# Patient Record
Sex: Male | Born: 1965
Health system: Southern US, Community
[De-identification: ages and names within clinical notes are randomized; demographics above are authoritative.]

## PROBLEM LIST (undated history)

## (undated) DIAGNOSIS — N050 Unspecified nephritic syndrome with minor glomerular abnormality: Secondary | ICD-10-CM

## (undated) DIAGNOSIS — E7800 Pure hypercholesterolemia, unspecified: Secondary | ICD-10-CM

## (undated) DIAGNOSIS — M109 Gout, unspecified: Secondary | ICD-10-CM

## (undated) DIAGNOSIS — I2510 Atherosclerotic heart disease of native coronary artery without angina pectoris: Secondary | ICD-10-CM

## (undated) DIAGNOSIS — I10 Essential (primary) hypertension: Secondary | ICD-10-CM

## (undated) HISTORY — PX: CHOLECYSTECTOMY: SHX55

## (undated) HISTORY — PX: CARDIAC CATHETERIZATION: SHX172

## (undated) HISTORY — PX: THORACENTESIS: SHX235

---

## 1984-11-12 DIAGNOSIS — S27302A Unspecified injury of lung, bilateral, initial encounter: Secondary | ICD-10-CM

## 1984-11-12 HISTORY — DX: Unspecified injury of lung, bilateral, initial encounter: S27.302A

## 2006-09-27 ENCOUNTER — Encounter: Admission: RE | Admit: 2006-09-27 | Discharge: 2006-09-27 | Payer: Self-pay | Admitting: Occupational Medicine

## 2009-12-03 ENCOUNTER — Other Ambulatory Visit: Payer: Self-pay | Admitting: Emergency Medicine

## 2009-12-03 ENCOUNTER — Ambulatory Visit: Payer: Self-pay | Admitting: Infectious Diseases

## 2009-12-03 ENCOUNTER — Inpatient Hospital Stay (HOSPITAL_COMMUNITY): Admission: EM | Admit: 2009-12-03 | Discharge: 2009-12-07 | Payer: Self-pay | Admitting: Emergency Medicine

## 2009-12-06 ENCOUNTER — Encounter: Payer: Self-pay | Admitting: Infectious Diseases

## 2011-01-29 LAB — COMPREHENSIVE METABOLIC PANEL
ALT: 152 U/L — ABNORMAL HIGH (ref 0–53)
ALT: 322 U/L — ABNORMAL HIGH (ref 0–53)
AST: 65 U/L — ABNORMAL HIGH (ref 0–37)
Albumin: 3.7 g/dL (ref 3.5–5.2)
Albumin: 4.2 g/dL (ref 3.5–5.2)
Albumin: 4.7 g/dL (ref 3.5–5.2)
Alkaline Phosphatase: 79 U/L (ref 39–117)
Alkaline Phosphatase: 86 U/L (ref 39–117)
BUN: 13 mg/dL (ref 6–23)
BUN: 4 mg/dL — ABNORMAL LOW (ref 6–23)
CO2: 26 mEq/L (ref 19–32)
CO2: 31 mEq/L (ref 19–32)
Calcium: 10 mg/dL (ref 8.4–10.5)
Calcium: 8.7 mg/dL (ref 8.4–10.5)
Calcium: 9.1 mg/dL (ref 8.4–10.5)
Creatinine, Ser: 0.9 mg/dL (ref 0.4–1.5)
Creatinine, Ser: 1.03 mg/dL (ref 0.4–1.5)
GFR calc Af Amer: >60 mL/min (ref >60)
GFR calc Af Amer: >60 mL/min (ref >60)
GFR calc non Af Amer: >60 mL/min (ref >60)
Glucose, Bld: 128 mg/dL — ABNORMAL HIGH (ref 70–99)
Sodium: 137 mEq/L (ref 135–145)
Sodium: 139 mEq/L (ref 135–145)
Total Bilirubin: 1 mg/dL (ref 0.3–1.2)
Total Bilirubin: 1.8 mg/dL — ABNORMAL HIGH (ref 0.3–1.2)

## 2011-01-29 LAB — CBC
HCT: 42.6 % (ref 39.0–52.0)
HCT: 46.1 % (ref 39.0–52.0)
HCT: 47.8 % (ref 39.0–52.0)
Hemoglobin: 13.9 g/dL (ref 13.0–17.0)
Hemoglobin: 14.4 g/dL (ref 13.0–17.0)
Hemoglobin: 15.1 g/dL (ref 13.0–17.0)
Hemoglobin: 16.1 g/dL (ref 13.0–17.0)
Hemoglobin: 16.4 g/dL (ref 13.0–17.0)
MCHC: 34 g/dL (ref 30.0–36.0)
MCHC: 35 g/dL (ref 30.0–36.0)
MCV: 92 fL (ref 78.0–100.0)
Platelets: 218 10*3/uL (ref 150–400)
Platelets: 219 10*3/uL (ref 150–400)
Platelets: 223 10*3/uL (ref 150–400)
Platelets: 226 10*3/uL (ref 150–400)
Platelets: 257 10*3/uL (ref 150–400)
RBC: 4.49 MIL/uL (ref 4.22–5.81)
RBC: 4.66 MIL/uL (ref 4.22–5.81)
RBC: 5.19 MIL/uL (ref 4.22–5.81)
RDW: 12.2 % (ref 11.5–15.5)
RDW: 12.3 % (ref 11.5–15.5)
RDW: 12.5 % (ref 11.5–15.5)
RDW: 12.7 % (ref 11.5–15.5)
WBC: 10.1 10*3/uL (ref 4.0–10.5)
WBC: 10.9 10*3/uL — ABNORMAL HIGH (ref 4.0–10.5)
WBC: 6 10*3/uL (ref 4.0–10.5)
WBC: 7.4 10*3/uL (ref 4.0–10.5)

## 2011-01-29 LAB — BASIC METABOLIC PANEL
CO2: 26 mEq/L (ref 19–32)
Chloride: 104 mEq/L (ref 96–112)
Glucose, Bld: 117 mg/dL — ABNORMAL HIGH (ref 70–99)
Sodium: 137 mEq/L (ref 135–145)

## 2011-01-29 LAB — URINALYSIS, ROUTINE W REFLEX MICROSCOPIC
Glucose, UA: NEGATIVE mg/dL
Hgb urine dipstick: NEGATIVE
Specific Gravity, Urine: 1.013 (ref 1.005–1.030)

## 2011-01-29 LAB — HIV ANTIBODY (ROUTINE TESTING W REFLEX): HIV: NONREACTIVE

## 2011-01-29 LAB — DIFFERENTIAL
Basophils Absolute: 0.1 10*3/uL (ref 0.0–0.1)
Basophils Relative: 1 % (ref 0–1)
Eosinophils Absolute: 0.1 10*3/uL (ref 0.0–0.7)
Eosinophils Relative: 1 % (ref 0–5)
Lymphocytes Relative: 21 % (ref 12–46)
Lymphs Abs: 2.3 10*3/uL (ref 0.7–4.0)

## 2011-01-29 LAB — POCT CARDIAC MARKERS
Myoglobin, poc: 65.6 ng/mL (ref 12–200)
Troponin i, poc: <0.05 ng/mL (ref 0.00–0.09)

## 2011-01-29 LAB — PROTIME-INR: INR: 0.88 (ref 0.00–1.49)

## 2011-01-29 LAB — LACTATE DEHYDROGENASE: LDH: 311 U/L — ABNORMAL HIGH (ref 94–250)

## 2011-01-29 LAB — HEPATITIS PANEL, ACUTE: Hep A IgM: NEGATIVE

## 2011-01-29 LAB — LIPASE, BLOOD: Lipase: >2000 U/L — ABNORMAL HIGH (ref 23–300)

## 2013-11-03 ENCOUNTER — Ambulatory Visit: Payer: Self-pay | Admitting: Nurse Practitioner

## 2015-10-14 ENCOUNTER — Other Ambulatory Visit: Payer: Self-pay | Admitting: Gastroenterology

## 2016-11-16 ENCOUNTER — Emergency Department (HOSPITAL_COMMUNITY): Payer: BLUE CROSS/BLUE SHIELD

## 2016-11-16 ENCOUNTER — Encounter (HOSPITAL_COMMUNITY): Payer: Self-pay | Admitting: Nurse Practitioner

## 2016-11-16 ENCOUNTER — Emergency Department (HOSPITAL_COMMUNITY)
Admission: EM | Admit: 2016-11-16 | Discharge: 2016-11-17 | Disposition: A | Discharge status: Home / Self Care | Payer: BLUE CROSS/BLUE SHIELD | Attending: Emergency Medicine | Admitting: Emergency Medicine

## 2016-11-16 DIAGNOSIS — M25511 Pain in right shoulder: Secondary | ICD-10-CM

## 2016-11-16 DIAGNOSIS — Z79899 Other long term (current) drug therapy: Secondary | ICD-10-CM | POA: Diagnosis not present

## 2016-11-16 DIAGNOSIS — R079 Chest pain, unspecified: Secondary | ICD-10-CM | POA: Diagnosis not present

## 2016-11-16 DIAGNOSIS — I10 Essential (primary) hypertension: Secondary | ICD-10-CM | POA: Diagnosis not present

## 2016-11-16 HISTORY — DX: Essential (primary) hypertension: I10

## 2016-11-16 HISTORY — DX: Pure hypercholesterolemia, unspecified: E78.00

## 2016-11-16 LAB — BASIC METABOLIC PANEL
Anion gap: 9 (ref 5–15)
BUN: 14 mg/dL (ref 6–20)
CO2: 26 mmol/L (ref 22–32)
Calcium: 9.4 mg/dL (ref 8.9–10.3)
Chloride: 100 mmol/L — ABNORMAL LOW (ref 101–111)
Creatinine, Ser: 1.07 mg/dL (ref 0.61–1.24)
GFR calc Af Amer: >60 mL/min (ref >60)
GFR calc non Af Amer: >60 mL/min (ref >60)
GLUCOSE: 100 mg/dL — AB (ref 65–99)
POTASSIUM: 4.3 mmol/L (ref 3.5–5.1)
Sodium: 135 mmol/L (ref 135–145)

## 2016-11-16 LAB — CBC
HEMATOCRIT: 46.4 % (ref 39.0–52.0)
Hemoglobin: 16.4 g/dL (ref 13.0–17.0)
MCH: 31.5 pg (ref 26.0–34.0)
MCHC: 35.3 g/dL (ref 30.0–36.0)
MCV: 89.1 fL (ref 78.0–100.0)
Platelets: 224 10*3/uL (ref 150–400)
RBC: 5.21 MIL/uL (ref 4.22–5.81)
RDW: 12.4 % (ref 11.5–15.5)
WBC: 10.3 10*3/uL (ref 4.0–10.5)

## 2016-11-16 LAB — I-STAT TROPONIN, ED: Troponin i, poc: 0 ng/mL (ref 0.00–0.08)

## 2016-11-16 NOTE — ED Triage Notes (Signed)
Pt presents with c/o CP. The pain began this afternoon after he ate lunch while he was sitting at rest. The pain is in the left side of his chest and radiates into his back. He describes the pain as tightness. He denies dizziness, diaphoresis, SOB, cough, nausea.

## 2016-11-17 ENCOUNTER — Emergency Department (HOSPITAL_COMMUNITY): Payer: BLUE CROSS/BLUE SHIELD

## 2016-11-17 LAB — I-STAT TROPONIN, ED: TROPONIN I, POC: 0 ng/mL (ref 0.00–0.08)

## 2016-11-17 MED ORDER — COLCHICINE 0.6 MG PO TABS: 0.6000 mg | ORAL_TABLET | Freq: Two times a day (BID) | ORAL | 0 refills | Status: DC

## 2016-11-17 MED ORDER — METHOCARBAMOL 500 MG PO TABS: 500.0000 mg | ORAL_TABLET | Freq: Two times a day (BID) | ORAL | 0 refills | Status: DC

## 2016-11-17 MED ORDER — GI COCKTAIL ~~LOC~~
30.0000 mL | Freq: Once | ORAL | Status: AC
  Administered 2016-11-17: 30 mL via ORAL
  Filled 2016-11-17: qty 30

## 2016-11-17 MED ORDER — ASPIRIN 81 MG PO CHEW
324.0000 mg | CHEWABLE_TABLET | Freq: Once | ORAL | Status: AC
  Administered 2016-11-17: 324 mg via ORAL
  Filled 2016-11-17: qty 4

## 2016-11-17 MED ORDER — PANTOPRAZOLE SODIUM 40 MG PO TBEC
40.0000 mg | DELAYED_RELEASE_TABLET | Freq: Once | ORAL | Status: AC
  Administered 2016-11-17: 40 mg via ORAL
  Filled 2016-11-17: qty 1

## 2016-11-17 MED ORDER — NITROGLYCERIN 0.4 MG SL SUBL
0.4000 mg | SUBLINGUAL_TABLET | SUBLINGUAL | Status: AC | PRN
  Administered 2016-11-17 (×3): 0.4 mg via SUBLINGUAL
  Filled 2016-11-17: qty 1

## 2016-11-17 MED ORDER — MORPHINE SULFATE (PF) 4 MG/ML IV SOLN
4.0000 mg | Freq: Once | INTRAVENOUS | Status: AC
  Administered 2016-11-17: 4 mg via INTRAVENOUS
  Filled 2016-11-17: qty 1

## 2016-11-17 MED ORDER — IOPAMIDOL (ISOVUE-370) INJECTION 76%
INTRAVENOUS | Status: AC
  Administered 2016-11-17: 100 mL
  Filled 2016-11-17: qty 100

## 2016-11-17 NOTE — Discharge Instructions (Signed)
1. Medications: STOP allopurinol, begin colchicine, robaxin for muscle pain, usual home medications 2. Treatment: rest, drink plenty of fluids,  3. Follow Up: Please followup with your primary doctor in 2 days and cardiology in approx 1 week for discussion of your diagnoses and further evaluation after today's visit; if you do not have a primary care doctor use the resource guide provided to find one; Please return to the ER for worsening pain, or pain associated with diaphoresis, nausea, vomiting or other concerns

## 2016-11-17 NOTE — ED Provider Notes (Signed)
[facility]-EMERGENCY DEPT Provider Note   CSN: [id number] Arrival date & time: 11/16/16  1641     History   Chief Complaint Chief Complaint  Patient presents with  . Chest Pain    HPI Rodney Strickland is a 51 y.o. male with a hx of High cholesterol, hypertension presents to the Emergency Department complaining of gradual, persistent, progressively worsening left-sided and central chest pain onset 1 PM this afternoon. Patient reports no lifting, straining or new activities.  He reports that pain is an 8/10 and radiates to his back and into his right shoulder. He reports mild associated shortness of breath. No nausea, diaphoresis, syncope, near syncope, weakness, numbness, tingling. No treatments prior to arrival. Patient denies history of chest pain.  He reports that his cholesterol and hypertension are diet and exercise controlled.  Patient reports that he has had right shoulder pain for last 3 months intermittently. He reports that sleeping on the shoulder at night makes it worse. He reports taking 800 mg of ibuprofen almost daily due to the shoulder pain. He has a history of GERD or gastritis. No hematemesis, melena or hematochezia.  He denies international travel but does report recent car trip to [place]. He denies swelling of his legs, history of DVT, calf tenderness, recent fracture surgery.  Pt denies recent viral illnesses or sick contacts.    The history is provided by the patient, medical records and the spouse. No language interpreter was used.    Past Medical History:  Diagnosis Date  . Hypercholesteremia   . Hypertension     There are no active problems to display for this patient.   Past Surgical History:  Procedure Laterality Date  . CHOLECYSTECTOMY    . THORACENTESIS         Home Medications    Prior to Admission medications   Medication Sig Start Date End Date Taking? Authorizing Provider  ibuprofen (ADVIL,MOTRIN) 200 MG tablet Take 400-600 mg by mouth every 6  (six) hours as needed for moderate pain.   Yes Historical Provider, MD  colchicine 0.6 MG tablet Take 1 tablet (0.6 mg total) by mouth 2 (two) times daily. 11/17/16   Michelyn Scullin, PA-C  methocarbamol (ROBAXIN) 500 MG tablet Take 1 tablet (500 mg total) by mouth 2 (two) times daily. 11/17/16   Dahlia Client Tyrees Chopin, PA-C    Family History History reviewed. No pertinent family history.  Social History Social History  Substance Use Topics  . Smoking status: Never Smoker  . Smokeless tobacco: Never Used  . Alcohol use No     Allergies   Patient has no known allergies.   Review of Systems Review of Systems  Cardiovascular: Positive for chest pain.  Musculoskeletal: Positive for arthralgias and back pain.  All other systems reviewed and are negative.    Physical Exam Updated Vital Signs BP 140/99   Pulse 78   Temp 98.7 F (37.1 C) (Oral)   Resp 16   SpO2 98%   Physical Exam  Constitutional: He appears well-developed and well-nourished. No distress.  Awake, alert, nontoxic appearance  HENT:  Head: Normocephalic and atraumatic.  Mouth/Throat: Oropharynx is clear and moist. No oropharyngeal exudate.  Eyes: Conjunctivae are normal. No scleral icterus.  Neck: Normal range of motion. Neck supple.  Cardiovascular: Normal rate, regular rhythm and intact distal pulses.   Pulmonary/Chest: Effort normal and breath sounds normal. No respiratory distress. He has no wheezes.  Equal chest expansion  Abdominal: Soft. Bowel sounds are normal. He exhibits no  mass. There is no tenderness. There is no rebound and no guarding.  Musculoskeletal: He exhibits no edema.       Right shoulder: He exhibits decreased range of motion and pain. He exhibits no tenderness, no swelling, no effusion, no deformity, no laceration and normal strength.  Decreased range of motion right shoulder due to pain, no joint line tenderness, erythema or induration. No increased warmth.  Neurological: He is alert.    Speech is clear and goal oriented Moves extremities without ataxia  Skin: Skin is warm and dry. He is not diaphoretic.  Psychiatric: He has a normal mood and affect.  Nursing note and vitals reviewed.    ED Treatments / Results  Labs (all labs ordered are listed, but only abnormal results are displayed) Labs Reviewed  BASIC METABOLIC PANEL - Abnormal; Notable for the following:       Result Value   Chloride 100 (*)    Glucose, Bld 100 (*)    All other components within normal limits  CBC  I-STAT TROPOININ, ED  I-STAT TROPOININ, ED    EKG  EKG Interpretation  Date/Time:  Friday November 16 2016 16:45:03 EST Ventricular Rate:  71 PR Interval:  158 QRS Duration: 116 QT Interval:  384 QTC Calculation: 417 R Axis:   39 Text Interpretation:  Normal sinus rhythm with sinus arrhythmia Incomplete right bundle branch block Borderline ECG Confirmed by WARD,  DO, KRISTEN [id number]) on 11/17/2016 2:52:52 AM        Radiology Dg Chest 2 View  Result Date: 11/16/2016 CLINICAL DATA:  51 year old male with left-sided chest pain and tightness today. EXAM: CHEST  2 VIEW COMPARISON:  Chest x-ray 09/27/2006. FINDINGS: Lung volumes are normal. No consolidative airspace disease. No pleural effusions. No pneumothorax. No pulmonary nodule or mass noted. Pulmonary vasculature and the cardiomediastinal silhouette are within normal limits. Surgical clips project over the right upper quadrant of the abdomen, presumably from prior cholecystectomy. IMPRESSION: No radiographic evidence of acute cardiopulmonary disease. Electronically Signed   By: Trudie Reedaniel  Entrikin M.D.   On: 11/16/2016 18:39   Dg Shoulder Right  Result Date: 11/17/2016 CLINICAL DATA:  51 y/o  M; 3 months of right shoulder pain. EXAM: RIGHT SHOULDER - 2+ VIEW COMPARISON:  None. FINDINGS: There is no evidence of fracture or dislocation. There is no evidence of arthropathy or other focal bone abnormality. Soft tissues are unremarkable. IMPRESSION:  Negative. Electronically Signed   By: Mitzi HansenLance  Furusawa-Stratton M.D.   On: 11/17/2016 02:29   Ct Angio Chest/abd/pel For Dissection W And/or Wo Contrast  Result Date: 11/17/2016 CLINICAL DATA:  Chest pain radiating to the back and right shoulder. EXAM: CT ANGIOGRAPHY CHEST, ABDOMEN AND PELVIS TECHNIQUE: Multidetector CT imaging through the chest, abdomen and pelvis was performed using the standard protocol during bolus administration of intravenous contrast. Multiplanar reconstructed images and MIPs were obtained and reviewed to evaluate the vascular anatomy. CONTRAST:  100 mL Isovue 370 intravenous COMPARISON:  None. FINDINGS: CTA CHEST FINDINGS Cardiovascular: Preferential opacification of the thoracic aorta. No evidence of thoracic aortic aneurysm or dissection. Normal heart size. No pericardial effusion. Mediastinum/Nodes: No enlarged mediastinal, hilar, or axillary lymph nodes. Thyroid gland, trachea, and esophagus demonstrate no significant findings. Lungs/Pleura: Lungs are clear. No pleural effusion or pneumothorax. Musculoskeletal: No chest wall abnormality. No acute or significant osseous findings. Review of the MIP images confirms the above findings. CTA ABDOMEN AND PELVIS FINDINGS VASCULAR Aorta: Normal caliber aorta without aneurysm, dissection, vasculitis or significant stenosis. Celiac: Patent without evidence  of aneurysm, dissection, vasculitis or significant stenosis. SMA: Patent without evidence of aneurysm, dissection, vasculitis or significant stenosis. Renals: Both renal arteries are patent without evidence of aneurysm, dissection, vasculitis, fibromuscular dysplasia or significant stenosis. IMA: Patent without evidence of aneurysm, dissection, vasculitis or significant stenosis. Inflow: Patent without evidence of aneurysm, dissection, vasculitis or significant stenosis. Veins: No obvious venous abnormality within the limitations of this arterial phase study. Review of the MIP images confirms  the above findings. NON-VASCULAR Hepatobiliary: No focal liver abnormality is seen. No gallstones, gallbladder wall thickening, or biliary dilatation. Pancreas: Unremarkable. No pancreatic ductal dilatation or surrounding inflammatory changes. Spleen: Normal in size without focal abnormality. Adrenals/Urinary Tract: Adrenal glands are unremarkable. Kidneys are normal, without renal calculi, focal lesion, or hydronephrosis. Bladder is unremarkable. Stomach/Bowel: Stomach is within normal limits. Appendix is normal. No evidence of bowel wall thickening, distention, or inflammatory changes. Lymphatic: No adenopathy in the abdomen or pelvis. Reproductive: Unremarkable Other: No acute inflammation.  No ascites. Musculoskeletal: 1.7 x 5 cm hypodense benign-appearing focus within the right iliacus muscle, doubtful significance. No significant skeletal lesion. Review of the MIP images confirms the above findings. IMPRESSION: The aorta is normal in caliber with no evidence of dissection or aneurysm. Mild atherosclerotic calcification of the aorta. No acute vascular abnormality. No significant abnormality in the chest, abdomen or pelvis. Electronically Signed   By: Ellery Plunk M.D.   On: 11/17/2016 02:29    Procedures Procedures (including critical care time)  Medications Ordered in ED Medications  aspirin chewable tablet 324 mg (324 mg Oral Given 11/17/16 0225)  nitroGLYCERIN (NITROSTAT) SL tablet 0.4 mg (0.4 mg Sublingual Given 11/17/16 0256)  iopamidol (ISOVUE-370) 76 % injection (100 mLs  Contrast Given 11/17/16 0134)  morphine 4 MG/ML injection 4 mg (4 mg Intravenous Given 11/17/16 0300)  gi cocktail (Maalox,Lidocaine,Donnatal) (30 mLs Oral Given 11/17/16 0300)  pantoprazole (PROTONIX) EC tablet 40 mg (40 mg Oral Given 11/17/16 0300)     Initial Impression / Assessment and Plan / ED Course  I have reviewed the triage vital signs and the nursing notes.  Pertinent labs & imaging results that were available  during my care of the patient were reviewed by me and considered in my medical decision making (see chart for details).  Clinical Course as of Nov 18 455  Sat Nov 17, 2016  0125 Incomplete right bundle branch block ED EKG within 10 minutes [HM]  0126 No pneumonia or pneumothorax DG Chest 2 View [HM]  0126 No leukocytosis and no anemia WBC: 10.3 [HM]  0126 Normal potassium Potassium: 4.3 [HM]  0126 Normal Creatinine: 1.07 [HM]  0126 Negative troponin Troponin i, poc: 0.00 [HM]  0126 No tachycardia Pulse Rate: 78 [HM]  0142 Repeat trop neg  [HM]  0304 Heart score 4; new RBBB and cardiac risk factors.  Pt with CP 5/10 at this time after nitro and morphine.  Pt will need admission for further cardiac work-up.  [HM]  [phone] Discussed with Dr. Antionette Char who does not feel pt needs admission at this time and requests Cardiology consult.   [HM]  [phone] Discussed with Dr. Clarnce Flock who will evaluate in the ED.    [HM]  0438 Pt evaluated by Dr. Clarnce Flock (cardiology) who believes this is pericarditis.  He recommends course of Colchicine and outpatient follow-up.  He recommends outpatient stress only if patient does not improve after treatment.    [HM]    Clinical Course User Index [HM] Dahlia Client Raha Tennison, PA-C    Pt with  chest pain radiating to the back and right shoulder.  No recent illness.  ECG with new RBBB.  No evidence of PE or dissection on CT scan.  Pt with multiple treatments without relief.  Heart score 4.    Delta trop continues to be negative.  After > 6 hours from onset.  Doubt ACS at this time.  Pt also taking numerous NSAIDs for his shoulder and suspect some degree of GERD/gastritis.  Recommend switch to Tylenol for shoulder pain.    Evaluated by cardiology who feels this is pericarditis and recommends d/c home.  Discussed with patient and wife who are in agreement with the treatment plan.  Discussed reasons to return to the ED.    Final Clinical Impressions(s) / ED Diagnoses   Final diagnoses:    Chest pain, unspecified type  Acute pain of right shoulder    New Prescriptions New Prescriptions   COLCHICINE 0.6 MG TABLET    Take 1 tablet (0.6 mg total) by mouth 2 (two) times daily.   METHOCARBAMOL (ROBAXIN) 500 MG TABLET    Take 1 tablet (500 mg total) by mouth 2 (two) times daily.     Dahlia Client Micael Barb, PA-C 11/17/16 0459    Layla Maw Ward, DO 11/17/16 [phone]

## 2016-11-21 ENCOUNTER — Encounter: Payer: Self-pay | Admitting: Sports Medicine

## 2016-11-21 ENCOUNTER — Ambulatory Visit (INDEPENDENT_AMBULATORY_CARE_PROVIDER_SITE_OTHER): Payer: BLUE CROSS/BLUE SHIELD | Admitting: Sports Medicine

## 2016-11-21 DIAGNOSIS — M25511 Pain in right shoulder: Secondary | ICD-10-CM | POA: Diagnosis not present

## 2016-11-21 DIAGNOSIS — G8929 Other chronic pain: Secondary | ICD-10-CM | POA: Diagnosis not present

## 2016-11-21 NOTE — Assessment & Plan Note (Signed)
Pain for 3 months now with fairly widespread but mild degenerative changes in the shoulder x-ray. Symptoms are referable to the biceps tendon proximally. History of oral medications, no improvement, at this point we are going to proceed with bicipital tendon sheath injection under ultrasound guidance, formal physical therapy, return in one month. MRI if no better.

## 2016-11-21 NOTE — Progress Notes (Signed)
Subjective:    I'm seeing this patient as a consultation for:  [employer], PA-C  CC: right shoulder pain  HPI: This is a pleasant 51 year old male, for 3-4 months now he's had pain that he localizes on the anterior aspect of his right shoulder after trying to start a pull-start pressure washer.  Pain is anterior, radiates to the mid bicep, worse with flexion, bothers him significantly at night.  He did have an x-ray that shows some mild diffuse degenerative changes. Has been taking moderate to high doses of ibuprofen with moderate relief, has never had physical therapy, injections, or advanced imaging.  Past medical history:  Negative.  See flowsheet/record as well for more information.  Surgical history: Negative.  See flowsheet/record as well for more information.  Family history: Negative.  See flowsheet/record as well for more information.  Social history: Negative.  See flowsheet/record as well for more information.  Allergies, and medications have been entered into the medical record, reviewed, and no changes needed.   Review of Systems: No headache, visual changes, nausea, vomiting, diarrhea, constipation, dizziness, abdominal pain, skin rash, fevers, chills, night sweats, weight loss, swollen lymph nodes, body aches, joint swelling, muscle aches, chest pain, shortness of breath, mood changes, visual or auditory hallucinations.   Objective:   General: Well Developed, well nourished, and in no acute distress.  Neuro/Psych: Alert and oriented x3, extra-ocular muscles intact, able to move all 4 extremities, sensation grossly intact. Skin: Warm and dry, no rashes noted.  Respiratory: Not using accessory muscles, speaking in full sentences, trachea midline.  Cardiovascular: Pulses palpable, no extremity edema. Abdomen: Does not appear distended. Right Shoulder: Inspection reveals no abnormalities, atrophy or asymmetry. Palpation is normal with no tenderness over AC joint or  bicipital groove. ROM is full in all planes. Rotator cuff strength normal throughout. No signs of impingement with negative Neer and Hawkin's tests, empty can. Positive speeds test, negative urine recent test No labral pathology noted with negative Obrien's, negative crank, negative clunk, and good stability. Normal scapular function observed. No painful arc and no drop arm sign. No apprehension sign  Procedure: Real-time Ultrasound Guided Injection of right biceps tendon sheath Device: GE Logiq E  Verbal informed consent obtained.  Time-out conducted.  Noted no overlying erythema, induration, or other signs of local infection.  Skin prepped in a sterile fashion.  Local anesthesia: Topical Ethyl chloride.  With sterile technique and under real time ultrasound guidance:  I was able to visualize all of the rotator cuff tendons, there was what appeared to be a partial thickness, partial width tear in the supraspinatus without retraction, biceps tendon was well placed in the bicipital groove, I guided a 25-gauge needle into the bicipital groove and taking care to avoid intratendinous injection I injected 1 mL kenalog 40, 1 mL lidocaine, 1 mL Marcaine. Completed without difficulty  Pain immediately resolved suggesting accurate placement of the medication.  Advised to call if fevers/chills, erythema, induration, drainage, or persistent bleeding.  Images permanently stored and available for review in the ultrasound unit.  Impression: Technically successful ultrasound guided injection.  Impression and Recommendations:   This case required medical decision making of moderate complexity.  Right shoulder pain Pain for 3 months now with fairly widespread but mild degenerative changes in the shoulder x-ray. Symptoms are referable to the biceps tendon proximally. History of oral medications, no improvement, at this point we are going to proceed with bicipital tendon sheath injection under ultrasound  guidance, formal physical therapy, return  in one month. MRI if no better.

## 2016-11-28 ENCOUNTER — Ambulatory Visit: Payer: BLUE CROSS/BLUE SHIELD | Admitting: Physical Therapy

## 2016-12-14 ENCOUNTER — Ambulatory Visit: Payer: BLUE CROSS/BLUE SHIELD | Admitting: Rehabilitative and Restorative Service Providers"

## 2016-12-20 ENCOUNTER — Ambulatory Visit: Payer: BLUE CROSS/BLUE SHIELD | Admitting: Sports Medicine

## 2019-09-18 DIAGNOSIS — Z1159 Encounter for screening for other viral diseases: Secondary | ICD-10-CM | POA: Diagnosis not present

## 2019-12-30 DIAGNOSIS — Z0001 Encounter for general adult medical examination with abnormal findings: Secondary | ICD-10-CM | POA: Diagnosis not present

## 2019-12-30 DIAGNOSIS — Z125 Encounter for screening for malignant neoplasm of prostate: Secondary | ICD-10-CM | POA: Diagnosis not present

## 2020-01-21 DIAGNOSIS — R5383 Other fatigue: Secondary | ICD-10-CM | POA: Diagnosis not present

## 2020-01-21 DIAGNOSIS — E7800 Pure hypercholesterolemia, unspecified: Secondary | ICD-10-CM | POA: Diagnosis not present

## 2020-01-21 DIAGNOSIS — R7302 Impaired glucose tolerance (oral): Secondary | ICD-10-CM | POA: Diagnosis not present

## 2020-01-21 DIAGNOSIS — Z8249 Family history of ischemic heart disease and other diseases of the circulatory system: Secondary | ICD-10-CM | POA: Diagnosis not present

## 2020-01-21 DIAGNOSIS — I10 Essential (primary) hypertension: Secondary | ICD-10-CM | POA: Diagnosis not present

## 2020-05-06 DIAGNOSIS — I10 Essential (primary) hypertension: Secondary | ICD-10-CM | POA: Diagnosis not present

## 2020-05-06 DIAGNOSIS — E7800 Pure hypercholesterolemia, unspecified: Secondary | ICD-10-CM | POA: Diagnosis not present

## 2020-05-06 DIAGNOSIS — M79672 Pain in left foot: Secondary | ICD-10-CM | POA: Diagnosis not present

## 2020-05-10 MED FILL — ALLOPURINOL 100 MG TABS: 100 | 30 days supply | Qty: 30 | Fill #0

## 2020-08-08 DIAGNOSIS — I10 Essential (primary) hypertension: Secondary | ICD-10-CM | POA: Diagnosis not present

## 2020-08-08 DIAGNOSIS — E7800 Pure hypercholesterolemia, unspecified: Secondary | ICD-10-CM | POA: Diagnosis not present

## 2020-09-05 DIAGNOSIS — I10 Essential (primary) hypertension: Secondary | ICD-10-CM | POA: Diagnosis not present

## 2020-09-05 DIAGNOSIS — M79672 Pain in left foot: Secondary | ICD-10-CM | POA: Diagnosis not present

## 2020-09-05 DIAGNOSIS — E7800 Pure hypercholesterolemia, unspecified: Secondary | ICD-10-CM | POA: Diagnosis not present

## 2020-09-06 ENCOUNTER — Other Ambulatory Visit (HOSPITAL_COMMUNITY): Payer: Self-pay | Admitting: Family Medicine

## 2020-09-12 MED FILL — ROSUVASTATIN CALCIUM 10 MG: 10 | 30 days supply | Qty: 30 | Fill #0

## 2020-09-12 MED FILL — ALLOPURINOL 100 MG TABS: 100 | 30 days supply | Qty: 30 | Fill #0

## 2020-11-15 DIAGNOSIS — M5136 Other intervertebral disc degeneration, lumbar region: Secondary | ICD-10-CM | POA: Diagnosis not present

## 2020-11-15 DIAGNOSIS — M9904 Segmental and somatic dysfunction of sacral region: Secondary | ICD-10-CM | POA: Diagnosis not present

## 2020-11-15 DIAGNOSIS — M5417 Radiculopathy, lumbosacral region: Secondary | ICD-10-CM | POA: Diagnosis not present

## 2020-11-15 DIAGNOSIS — M9903 Segmental and somatic dysfunction of lumbar region: Secondary | ICD-10-CM | POA: Diagnosis not present

## 2020-11-20 ENCOUNTER — Other Ambulatory Visit: Payer: Self-pay

## 2020-11-20 ENCOUNTER — Emergency Department (HOSPITAL_BASED_OUTPATIENT_CLINIC_OR_DEPARTMENT_OTHER): Payer: Federal, State, Local not specified - PPO

## 2020-11-20 ENCOUNTER — Emergency Department (HOSPITAL_BASED_OUTPATIENT_CLINIC_OR_DEPARTMENT_OTHER)
Admission: EM | Admit: 2020-11-20 | Discharge: 2020-11-20 | Disposition: A | Discharge status: Home / Self Care | Payer: Federal, State, Local not specified - PPO | Attending: Emergency Medicine | Admitting: Emergency Medicine

## 2020-11-20 ENCOUNTER — Encounter (HOSPITAL_BASED_OUTPATIENT_CLINIC_OR_DEPARTMENT_OTHER): Payer: Self-pay

## 2020-11-20 DIAGNOSIS — Z79899 Other long term (current) drug therapy: Secondary | ICD-10-CM | POA: Insufficient documentation

## 2020-11-20 DIAGNOSIS — I10 Essential (primary) hypertension: Secondary | ICD-10-CM

## 2020-11-20 DIAGNOSIS — R2243 Localized swelling, mass and lump, lower limb, bilateral: Secondary | ICD-10-CM | POA: Insufficient documentation

## 2020-11-20 DIAGNOSIS — R519 Headache, unspecified: Secondary | ICD-10-CM | POA: Diagnosis not present

## 2020-11-20 DIAGNOSIS — R030 Elevated blood-pressure reading, without diagnosis of hypertension: Secondary | ICD-10-CM | POA: Diagnosis not present

## 2020-11-20 LAB — CBC WITH DIFFERENTIAL/PLATELET
Abs Immature Granulocytes: 0.02 10*3/uL (ref 0.00–0.07)
Basophils Absolute: 0 10*3/uL (ref 0.0–0.1)
Basophils Relative: 1 %
Eosinophils Absolute: 0.2 10*3/uL (ref 0.0–0.5)
Eosinophils Relative: 3 %
HCT: 45.4 % (ref 39.0–52.0)
Hemoglobin: 16.1 g/dL (ref 13.0–17.0)
Immature Granulocytes: 0 %
Lymphocytes Relative: 36 %
Lymphs Abs: 2.1 10*3/uL (ref 0.7–4.0)
MCH: 31.7 pg (ref 26.0–34.0)
MCHC: 35.5 g/dL (ref 30.0–36.0)
MCV: 89.4 fL (ref 80.0–100.0)
Monocytes Absolute: 0.4 10*3/uL (ref 0.1–1.0)
Monocytes Relative: 8 %
Neutro Abs: 3.1 10*3/uL (ref 1.7–7.7)
Neutrophils Relative %: 52 %
Platelets: 255 10*3/uL (ref 150–400)
RBC: 5.08 MIL/uL (ref 4.22–5.81)
RDW: 11.9 % (ref 11.5–15.5)
WBC: 5.9 10*3/uL (ref 4.0–10.5)
nRBC: 0 % (ref 0.0–0.2)

## 2020-11-20 LAB — BASIC METABOLIC PANEL
Anion gap: 7 (ref 5–15)
BUN: 21 mg/dL — ABNORMAL HIGH (ref 6–20)
CO2: 25 mmol/L (ref 22–32)
Calcium: 8.1 mg/dL — ABNORMAL LOW (ref 8.9–10.3)
Chloride: 101 mmol/L (ref 98–111)
Creatinine, Ser: 1.03 mg/dL (ref 0.61–1.24)
GFR, Estimated: >60 mL/min (ref >60)
Glucose, Bld: 135 mg/dL — ABNORMAL HIGH (ref 70–99)
Potassium: 4 mmol/L (ref 3.5–5.1)
Sodium: 133 mmol/L — ABNORMAL LOW (ref 135–145)

## 2020-11-20 MED ORDER — AMLODIPINE BESYLATE 5 MG PO TABS: 5.0000 mg | ORAL_TABLET | Freq: Every day | ORAL | 0 refills | Status: DC

## 2020-11-20 MED ORDER — AMLODIPINE BESYLATE 5 MG PO TABS
5.0000 mg | ORAL_TABLET | Freq: Once | ORAL | Status: AC
  Administered 2020-11-20: 5 mg via ORAL
  Filled 2020-11-20: qty 1

## 2020-11-20 NOTE — ED Triage Notes (Addendum)
Pt arrives complaining of swelling to his face since Friday as well as hypertension since yesterday. Not on anti-hypertensive. C/o of slight headache. Slight edema to BLE. No new medications/enviornmental changes. Pt also has some shortness of breath

## 2020-11-20 NOTE — ED Provider Notes (Signed)
[facility] EMERGENCY DEPARTMENT Provider Note   CSN: [id number] Arrival date & time: 11/20/20  1706     History Chief Complaint  Patient presents with  . Hypertension  . Facial Swelling    Rodney Strickland is a 55 y.o. male.   Hypertension This is a new problem. The current episode started more than 1 week ago. The problem occurs daily. The problem has not changed since onset.Pertinent negatives include no chest pain, no abdominal pain, no headaches and no shortness of breath. Nothing aggravates the symptoms. Nothing relieves the symptoms. He has tried nothing for the symptoms. The treatment provided no relief.       Past Medical History:  Diagnosis Date  . Hypercholesteremia   . Hypertension     Patient Active Problem List   Diagnosis Date Noted  . Right shoulder pain 11/21/2016    Past Surgical History:  Procedure Laterality Date  . CHOLECYSTECTOMY    . THORACENTESIS         History reviewed. No pertinent family history.  Social History   Tobacco Use  . Smoking status: Never Smoker  . Smokeless tobacco: Never Used  Substance Use Topics  . Alcohol use: No  . Drug use: No    Home Medications Prior to Admission medications   Medication Sig Start Date End Date Taking? Authorizing Provider  amLODipine (NORVASC) 5 MG tablet Take 1 tablet (5 mg total) by mouth daily. 11/20/20 12/20/20 Yes Henrique Parekh, DO  colchicine 0.6 MG tablet Take 1 tablet (0.6 mg total) by mouth 2 (two) times daily. 11/17/16   Muthersbaugh, Dahlia Client, PA-C  ibuprofen (ADVIL,MOTRIN) 200 MG tablet Take 400-600 mg by mouth every 6 (six) hours as needed for moderate pain.    [provider]  methocarbamol (ROBAXIN) 500 MG tablet Take 1 tablet (500 mg total) by mouth 2 (two) times daily. 11/17/16   Muthersbaugh, Dahlia Client, PA-C    Allergies    Patient has no known allergies.  Review of Systems   Review of Systems  Constitutional: Negative for chills and fever.  HENT: Positive  for facial swelling (?). Negative for ear pain and sore throat.   Eyes: Negative for pain and visual disturbance.  Respiratory: Negative for cough and shortness of breath.   Cardiovascular: Positive for leg swelling. Negative for chest pain and palpitations.  Gastrointestinal: Negative for abdominal pain and vomiting.  Genitourinary: Negative for dysuria and hematuria.  Musculoskeletal: Negative for arthralgias and back pain.  Skin: Negative for color change and rash.  Neurological: Negative for seizures, syncope and headaches.  All other systems reviewed and are negative.   Physical Exam Updated Vital Signs BP (!) 181/112 (BP Location: Left Arm)   Pulse 77   Temp 98.2 F (36.8 C) (Oral)   Resp 18   Ht 5\' 6"  (1.676 m)   Wt 76.2 kg   SpO2 100%   BMI 27.12 kg/m   Physical Exam Vitals and nursing note reviewed.  Constitutional:      Appearance: He is well-developed and well-nourished.  HENT:     Head: Normocephalic and atraumatic.     Nose: Nose normal.     Mouth/Throat:     Mouth: Mucous membranes are moist.  Eyes:     Extraocular Movements: Extraocular movements intact.     Conjunctiva/sclera: Conjunctivae normal.     Pupils: Pupils are equal, round, and reactive to light.  Cardiovascular:     Rate and Rhythm: Normal rate and regular rhythm.  Pulses: Normal pulses.     Heart sounds: Normal heart sounds. No murmur heard.   Pulmonary:     Effort: Pulmonary effort is normal. No respiratory distress.     Breath sounds: Normal breath sounds.  Abdominal:     Palpations: Abdomen is soft.     Tenderness: There is no abdominal tenderness.  Musculoskeletal:     Cervical back: Neck supple.     Right lower leg: Edema (trace) present.     Left lower leg: Edema (trace) present.  Skin:    General: Skin is warm and dry.  Neurological:     General: No focal deficit present.     Mental Status: He is alert and oriented to person, place, and time.     Cranial Nerves: No  cranial nerve deficit.     Sensory: No sensory deficit.     Motor: No weakness.     Coordination: Coordination normal.  Psychiatric:        Mood and Affect: Mood and affect and mood normal.     ED Results / Procedures / Treatments   Labs (all labs ordered are listed, but only abnormal results are displayed) Labs Reviewed  BASIC METABOLIC PANEL - Abnormal; Notable for the following components:      Result Value   Sodium 133 (*)    Glucose, Bld 135 (*)    BUN 21 (*)    Calcium 8.1 (*)    All other components within normal limits  CBC WITH DIFFERENTIAL/PLATELET    EKG EKG Interpretation  Date/Time:  Sunday November 20 2020 17:31:24 EST Ventricular Rate:  75 PR Interval:  158 QRS Duration: 104 QT Interval:  382 QTC Calculation: 426 R Axis:   18 Text Interpretation: Normal sinus rhythm Normal ECG Confirmed by Virgina Norfolk [phone]) on 11/20/2020 5:40:23 PM   Radiology DG Chest 2 View  Result Date: 11/20/2020 CLINICAL DATA:  Hypertension. Headache. Patient reports lower extremity swelling and facial swelling. EXAM: CHEST - 2 VIEW COMPARISON:  Radiograph 11/16/2016 emesis CT 11/17/2016 FINDINGS: Normal heart size. Unchanged mediastinal contours with aortic tortuosity. The lungs are clear. Pulmonary vasculature is normal. No consolidation, pleural effusion, or pneumothorax. No acute osseous abnormalities are seen. IMPRESSION: No acute chest findings. Electronically Signed   By: Narda Rutherford M.D.   On: 11/20/2020 18:05    Procedures Procedures (including critical care time)  Medications Ordered in ED Medications  amLODipine (NORVASC) tablet 5 mg (has no administration in time range)    ED Course  I have reviewed the triage vital signs and the nursing notes.  Pertinent labs & imaging results that were available during my care of the patient were reviewed by me and considered in my medical decision making (see chart for details).    MDM Rules/Calculators/A&P                           Rodney Strickland is a 55 year old male who presents to the ED with high blood pressure.  Not on any blood pressure meds.  Has noticed that he has felt some fluid on his legs and may be his face.  Has had headaches at time.  He is neurologically intact.  Blood pressure is 180/112.  Otherwise vitals are unremarkable.  EKG shows sinus rhythm.  Chest x-ray without any signs of volume overload, no pneumonia.  No significant anemia, electrolyte antibiotic, kidney injury.  Overall asymptomatic hypertension.  No concern for volume overload on exam.  Do not really appreciate much swelling in his face or legs.  Will prescribe Norvasc as he has had multiple elevated blood pressures at home.  Recommended he follow-up closely with his primary care doctor discharged from ED in good condition.  Understands return precautions.  This chart was dictated using voice recognition software.  Despite best efforts to proofread,  errors can occur which can change the documentation meaning.    Final Clinical Impression(s) / ED Diagnoses Final diagnoses:  Hypertension, unspecified type    Rx / DC Orders ED Discharge Orders         Ordered    amLODipine (NORVASC) 5 MG tablet  Daily        11/20/20 2032           Virgina Norfolk, DO 11/20/20 2032

## 2020-11-21 ENCOUNTER — Other Ambulatory Visit: Payer: Self-pay

## 2020-11-21 ENCOUNTER — Emergency Department (HOSPITAL_BASED_OUTPATIENT_CLINIC_OR_DEPARTMENT_OTHER): Payer: Federal, State, Local not specified - PPO

## 2020-11-21 ENCOUNTER — Encounter (HOSPITAL_BASED_OUTPATIENT_CLINIC_OR_DEPARTMENT_OTHER): Payer: Self-pay | Admitting: Emergency Medicine

## 2020-11-21 ENCOUNTER — Emergency Department (HOSPITAL_BASED_OUTPATIENT_CLINIC_OR_DEPARTMENT_OTHER)
Admission: EM | Admit: 2020-11-21 | Discharge: 2020-11-21 | Disposition: A | Discharge status: Home / Self Care | Payer: Federal, State, Local not specified - PPO | Attending: Emergency Medicine | Admitting: Emergency Medicine

## 2020-11-21 ENCOUNTER — Other Ambulatory Visit (HOSPITAL_BASED_OUTPATIENT_CLINIC_OR_DEPARTMENT_OTHER): Payer: Self-pay | Admitting: Student

## 2020-11-21 DIAGNOSIS — Z79899 Other long term (current) drug therapy: Secondary | ICD-10-CM | POA: Insufficient documentation

## 2020-11-21 DIAGNOSIS — I10 Essential (primary) hypertension: Secondary | ICD-10-CM | POA: Diagnosis not present

## 2020-11-21 DIAGNOSIS — R519 Headache, unspecified: Secondary | ICD-10-CM

## 2020-11-21 MED ORDER — HYDROCHLOROTHIAZIDE 25 MG PO TABS
25.0000 mg | ORAL_TABLET | Freq: Once | ORAL | Status: AC
  Administered 2020-11-21: 25 mg via ORAL
  Filled 2020-11-21: qty 1

## 2020-11-21 MED ORDER — HYDROCHLOROTHIAZIDE 25 MG PO TABS: 25.0000 mg | ORAL_TABLET | Freq: Every day | ORAL | 0 refills | Status: DC

## 2020-11-21 MED FILL — HYDROCHLOROTHIAZIDE 25 MG T: 25 | 30 days supply | Qty: 30 | Fill #0

## 2020-11-21 NOTE — ED Provider Notes (Signed)
[facility] EMERGENCY DEPARTMENT Provider Note   CSN: [id number] Arrival date & time: 11/21/20  1200     History Chief Complaint  Patient presents with  . Headache    Rodney Strickland is a 55 y.o. male presenting for evaluation of headache and persistent high blood pressure.  Pt states he got home blood pressure cuff this past weekend just for regular checking and found his blood pressure to be high.  He also was found to have headaches at that time.  His blood pressure has been persistently high at home, he was seen in the ED yesterday.  He had overall reassuring with negative labs and EKG.  He was started on amlodipine.  Patient states he had a dose last night, and took 10 mg this morning.  However he continues to have a headache and his blood pressure remains high.  He has not previously been diagnosed with high blood pressure.  He denies a history of tobacco or alcohol use.  He does not take daily OTC medicines including ibuprofen.  No recent change in diet, does not eat a particularly high salt diet.  He denies chest pain, shortness of breath, nausea, vomiting, abdominal pain, change in urination or bowel movements.  His headache is in the back of his head, is mild. Nothing makes it better or worse.   Social history obtained from chart review.  Reviewed ER visit from yesterday including labs, cxr, and EKG.  HPI     Past Medical History:  Diagnosis Date  . Hypercholesteremia   . Hypertension     Patient Active Problem List   Diagnosis Date Noted  . Right shoulder pain 11/21/2016    Past Surgical History:  Procedure Laterality Date  . CHOLECYSTECTOMY    . THORACENTESIS         No family history on file.  Social History   Tobacco Use  . Smoking status: Never Smoker  . Smokeless tobacco: Never Used  Substance Use Topics  . Alcohol use: No  . Drug use: No    Home Medications Prior to Admission medications   Medication Sig Start Date End Date Taking?  Authorizing Provider  hydrochlorothiazide (HYDRODIURIL) 25 MG tablet Take 1 tablet (25 mg total) by mouth daily. 11/21/20 12/21/20 Yes Kiaya Haliburton, PA-C  amLODipine (NORVASC) 5 MG tablet Take 1 tablet (5 mg total) by mouth daily. 11/20/20 12/20/20  Curatolo, Adam, DO  colchicine 0.6 MG tablet Take 1 tablet (0.6 mg total) by mouth 2 (two) times daily. 11/17/16   Muthersbaugh, Dahlia Client, PA-C  ibuprofen (ADVIL,MOTRIN) 200 MG tablet Take 400-600 mg by mouth every 6 (six) hours as needed for moderate pain.    [provider]  methocarbamol (ROBAXIN) 500 MG tablet Take 1 tablet (500 mg total) by mouth 2 (two) times daily. 11/17/16   Muthersbaugh, Dahlia Client, PA-C    Allergies    Patient has no known allergies.  Review of Systems   Review of Systems  Neurological: Positive for headaches.  All other systems reviewed and are negative.   Physical Exam Updated Vital Signs BP (!) 175/107   Pulse 83   Temp 98.3 F (36.8 C) (Oral)   Resp 18   Ht 5\' 6"  (1.676 m)   Wt 77.1 kg   SpO2 98%   BMI 27.44 kg/m   Physical Exam Vitals and nursing note reviewed.  Constitutional:      General: He is not in acute distress.    Appearance: He is well-developed and  well-nourished.     Comments: Resting in the bed in NAD  HENT:     Head: Normocephalic and atraumatic.  Eyes:     Extraocular Movements: Extraocular movements intact and EOM normal.     Conjunctiva/sclera: Conjunctivae normal.     Pupils: Pupils are equal, round, and reactive to light.  Cardiovascular:     Rate and Rhythm: Normal rate and regular rhythm.     Pulses: Normal pulses and intact distal pulses.  Pulmonary:     Effort: Pulmonary effort is normal. No respiratory distress.     Breath sounds: Normal breath sounds. No wheezing.  Abdominal:     General: There is no distension.     Palpations: Abdomen is soft. There is no mass.     Tenderness: There is no abdominal tenderness. There is no guarding or rebound.  Musculoskeletal:         General: Normal range of motion.     Cervical back: Normal range of motion and neck supple.     Comments: Trace pitting edema bilaterally.   Skin:    General: Skin is warm and dry.     Capillary Refill: Capillary refill takes less than 2 seconds.  Neurological:     General: No focal deficit present.     Mental Status: He is alert and oriented to person, place, and time.     GCS: GCS eye subscore is 4. GCS verbal subscore is 5. GCS motor subscore is 6.     Cranial Nerves: Cranial nerves are intact.     Sensory: Sensation is intact.     Motor: Motor function is intact. No pronator drift.     Coordination: Coordination is intact. Finger-Nose-Finger Test normal.     Comments: No obvious neurologic deficits.  CN intact.  Nose to finger intact.  Fine movement and coronation intact.  Negative pronator drift.  Strength and sensation intact x4  Psychiatric:        Mood and Affect: Mood and affect normal.     ED Results / Procedures / Treatments   Labs (all labs ordered are listed, but only abnormal results are displayed) Labs Reviewed - No data to display  EKG None  Radiology DG Chest 2 View  Result Date: 11/20/2020 CLINICAL DATA:  Hypertension. Headache. Patient reports lower extremity swelling and facial swelling. EXAM: CHEST - 2 VIEW COMPARISON:  Radiograph 11/16/2016 emesis CT 11/17/2016 FINDINGS: Normal heart size. Unchanged mediastinal contours with aortic tortuosity. The lungs are clear. Pulmonary vasculature is normal. No consolidation, pleural effusion, or pneumothorax. No acute osseous abnormalities are seen. IMPRESSION: No acute chest findings. Electronically Signed   By: Narda Rutherford M.D.   On: 11/20/2020 18:05   CT Head Wo Contrast  Result Date: 11/21/2020 CLINICAL DATA:  Headache and hypertension EXAM: CT HEAD WITHOUT CONTRAST TECHNIQUE: Contiguous axial images were obtained from the base of the skull through the vertex without intravenous contrast. COMPARISON:  None.  FINDINGS: Brain: Ventricles and sulci are normal in size and configuration. There is no intracranial mass, hemorrhage, extra-axial fluid collection, or midline shift. Brain parenchyma appears unremarkable. No acute infarct evident. Vascular: No hyperdense vessel.  No evident vascular calcification. Skull: Bony calvarium appears intact. Sinuses/Orbits: There is mucosal thickening in several ethmoid air cells. Other visualized paranasal sinuses are clear. Visualized orbits appear symmetric bilaterally. Other: Mastoid air cells are clear. IMPRESSION: Mucosal thickening in several ethmoid air cells. Study otherwise unremarkable. Electronically Signed   By: Bretta Bang III M.D.  On: 11/21/2020 15:43    Procedures Procedures (including critical care time)  Medications Ordered in ED Medications  hydrochlorothiazide (HYDRODIURIL) tablet 25 mg (25 mg Oral Given 11/21/20 1507)    ED Course  I have reviewed the triage vital signs and the nursing notes.  Pertinent labs & imaging results that were available during my care of the patient were reviewed by me and considered in my medical decision making (see chart for details).    MDM Rules/Calculators/A&P                          Patient presenting for evaluation of persistently elevated blood pressure and headache. On exam, patient appears nontoxic.  No obvious neurologic deficits.  He had a negative work-up yesterday, although no head CT.  As patient continues to have high blood pressure and headache, will obtain head CT.  Will switch patient's blood pressure medication from amlodipine to HCTZ.  Discussed importance of low-salt diet.    CT head negative for acute findings. On reassessment of blood pressure, blood pressure is 159/100, improving with HCTZ. Discussed findings with patient and wife. Discussed switching from amlodipine to HCTZ. Encouraged close follow-up with primary care. At this time, patient appears safe for discharge. Return  precautions given. Patient states he understands and agrees to plan.  Final Clinical Impression(s) / ED Diagnoses Final diagnoses:  Hypertension, unspecified type  Nonintractable headache, unspecified chronicity pattern, unspecified headache type    Rx / DC Orders ED Discharge Orders         Ordered    hydrochlorothiazide (HYDRODIURIL) 25 MG tablet  Daily        11/21/20 1607           Alveria Apley, PA-C 11/21/20 1612    Cheryll Cockayne, MD 11/23/20 [phone]

## 2020-11-21 NOTE — ED Triage Notes (Signed)
Was seen here last night for same high bp and headach, was on no meds but was given rx for norvasc and has taken 10 today, pt wife has repeatedly raken his bp and it  Has been high

## 2020-11-21 NOTE — Discharge Instructions (Signed)
Take hydrochlorothiazide daily. I recommend you take it in the morning because it may increase the amount that you need to urinate. Do not take other blood pressure medications at the same time. Eat a low salt diet.  Make sure you are staying well-hydrated with water. It is important you follow-up with your primary care doctor in 1 to 2 weeks for recheck of your blood pressure and blood work. Return to the emergency room if you develop severe headache, vision changes, slurred speech, weakness/numbness on 1 side, chest pain, or any new or worsening, or concerning symptoms

## 2020-11-26 ENCOUNTER — Emergency Department (HOSPITAL_COMMUNITY)
Admission: EM | Admit: 2020-11-26 | Discharge: 2020-11-26 | Disposition: A | Discharge status: Home / Self Care | Payer: Federal, State, Local not specified - PPO | Attending: Emergency Medicine | Admitting: Emergency Medicine

## 2020-11-26 ENCOUNTER — Emergency Department (HOSPITAL_COMMUNITY): Payer: Federal, State, Local not specified - PPO

## 2020-11-26 ENCOUNTER — Other Ambulatory Visit: Payer: Self-pay

## 2020-11-26 ENCOUNTER — Encounter (HOSPITAL_COMMUNITY): Payer: Self-pay | Admitting: Emergency Medicine

## 2020-11-26 DIAGNOSIS — I10 Essential (primary) hypertension: Secondary | ICD-10-CM | POA: Insufficient documentation

## 2020-11-26 DIAGNOSIS — Z79899 Other long term (current) drug therapy: Secondary | ICD-10-CM | POA: Insufficient documentation

## 2020-11-26 DIAGNOSIS — R079 Chest pain, unspecified: Secondary | ICD-10-CM | POA: Diagnosis not present

## 2020-11-26 DIAGNOSIS — R0789 Other chest pain: Secondary | ICD-10-CM | POA: Diagnosis not present

## 2020-11-26 LAB — CBC
HCT: 44.9 % (ref 39.0–52.0)
Hemoglobin: 16.1 g/dL (ref 13.0–17.0)
MCH: 31.8 pg (ref 26.0–34.0)
MCHC: 35.9 g/dL (ref 30.0–36.0)
MCV: 88.7 fL (ref 80.0–100.0)
Platelets: 292 10*3/uL (ref 150–400)
RBC: 5.06 MIL/uL (ref 4.22–5.81)
RDW: 12.1 % (ref 11.5–15.5)
WBC: 6.4 10*3/uL (ref 4.0–10.5)
nRBC: 0 % (ref 0.0–0.2)

## 2020-11-26 LAB — BASIC METABOLIC PANEL
Anion gap: 8 (ref 5–15)
BUN: 24 mg/dL — ABNORMAL HIGH (ref 6–20)
CO2: 23 mmol/L (ref 22–32)
Calcium: 8.5 mg/dL — ABNORMAL LOW (ref 8.9–10.3)
Chloride: 105 mmol/L (ref 98–111)
Creatinine, Ser: 1.09 mg/dL (ref 0.61–1.24)
GFR, Estimated: >60 mL/min (ref >60)
Glucose, Bld: 97 mg/dL (ref 70–99)
Potassium: 4.2 mmol/L (ref 3.5–5.1)
Sodium: 136 mmol/L (ref 135–145)

## 2020-11-26 LAB — TROPONIN I (HIGH SENSITIVITY): Troponin I (High Sensitivity): 17 ng/L (ref <18)

## 2020-11-26 LAB — TSH: TSH: 3.252 u[IU]/mL (ref 0.350–4.500)

## 2020-11-26 MED ORDER — KETOROLAC TROMETHAMINE 30 MG/ML IJ SOLN
30.0000 mg | Freq: Once | INTRAMUSCULAR | Status: AC
  Administered 2020-11-26: 30 mg via INTRAVENOUS
  Filled 2020-11-26: qty 1

## 2020-11-26 MED ORDER — NAPROXEN 500 MG PO TABS: 500.0000 mg | ORAL_TABLET | Freq: Two times a day (BID) | ORAL | 0 refills | Status: DC

## 2020-11-26 MED ORDER — LISINOPRIL 10 MG PO TABS: 10.0000 mg | ORAL_TABLET | Freq: Every day | ORAL | 1 refills | Status: DC

## 2020-11-26 NOTE — ED Notes (Signed)
Pt discharged ambulatory. All questions and concerns addressed. No complaints at this time.   

## 2020-11-26 NOTE — ED Provider Notes (Signed)
[facility] Provider Note   CSN: [id number] Arrival date & time: 11/26/20  1738     History Chief Complaint  Patient presents with  . Chest Pain    [facility] is a 55 y.o. male.  HPI   This patient is a 55 year old male, he has a history of hypercholesterolemia and hypertension in fact the patient was seen twice last week in the emergency department because of his hypertension which was essentially asymptomatic however his significant other and the patient both confirm that his diastolic blood pressures have ranged between 90 and over 100.  He was started on amlodipine, they checked his blood pressure throughout the night and it did not seem to come down so the next day they came back to the ER and were changed to hydrochlorothiazide which she has been taking.  His diastolic blood pressure is now maintaining in the 90 range.  The patient now states to me that approximately 11 or 12 days ago he was doing some heavy lifting, these were heavy boxes and he noticed that after doing all of the lifting he had a pain in his left chest at the costal margin just left of the xiphoid process.  This has been intermittent since that time, it seems to get worse when he tries to change position moving onto his left side.  Sometimes it hurts to take a deep breath, it does not appear particularly exertional.  He did have some nausea, no vomiting, no coughing, he does not feel short of breath, sometimes the pain is worse with a deep breath.  He has no known heart disease, in fact he saw heart doctor several years ago and had a stress test which was normal at that time.  Past Medical History:  Diagnosis Date  . Hypercholesteremia   . Hypertension     Patient Active Problem List   Diagnosis Date Noted  . Right shoulder pain 11/21/2016    Past Surgical History:  Procedure Laterality Date  . CHOLECYSTECTOMY    . THORACENTESIS         No family history on  file.  Social History   Tobacco Use  . Smoking status: Never Smoker  . Smokeless tobacco: Never Used  Substance Use Topics  . Alcohol use: No  . Drug use: No    Home Medications Prior to Admission medications   Medication Sig Start Date End Date Taking? Authorizing Provider  lisinopril (ZESTRIL) 10 MG tablet Take 1 tablet (10 mg total) by mouth daily. 11/26/20  Yes Eber Hong, MD  naproxen (NAPROSYN) 500 MG tablet Take 1 tablet (500 mg total) by mouth 2 (two) times daily. 11/26/20  Yes Eber Hong, MD  colchicine 0.6 MG tablet Take 1 tablet (0.6 mg total) by mouth 2 (two) times daily. 11/17/16   Muthersbaugh, Dahlia Client, PA-C  hydrochlorothiazide (HYDRODIURIL) 25 MG tablet Take 1 tablet (25 mg total) by mouth daily. 11/21/20 12/21/20  Caccavale, Sophia, PA-C  ibuprofen (ADVIL,MOTRIN) 200 MG tablet Take 400-600 mg by mouth every 6 (six) hours as needed for moderate pain.    [provider]  methocarbamol (ROBAXIN) 500 MG tablet Take 1 tablet (500 mg total) by mouth 2 (two) times daily. 11/17/16   Muthersbaugh, Dahlia Client, PA-C  amLODipine (NORVASC) 5 MG tablet Take 1 tablet (5 mg total) by mouth daily. 11/20/20 11/26/20  Virgina Norfolk, DO    Allergies    Patient has no known allergies.  Review of Systems   Review  of Systems  All other systems reviewed and are negative.   Physical Exam Updated Vital Signs BP (!) 152/101   Pulse 72   Temp 98.2 F (36.8 C) (Oral)   Resp 17   SpO2 97%   Physical Exam Vitals and nursing note reviewed.  Constitutional:      General: He is not in acute distress.    Appearance: He is well-developed and well-nourished.  HENT:     Head: Normocephalic and atraumatic.     Mouth/Throat:     Mouth: Oropharynx is clear and moist.     Pharynx: No oropharyngeal exudate.  Eyes:     General: No scleral icterus.       Right eye: No discharge.        Left eye: No discharge.     Extraocular Movements: EOM normal.     Conjunctiva/sclera: Conjunctivae  normal.     Pupils: Pupils are equal, round, and reactive to light.  Neck:     Thyroid: No thyromegaly.     Vascular: No JVD.  Cardiovascular:     Rate and Rhythm: Normal rate and regular rhythm.     Pulses: Intact distal pulses.     Heart sounds: Normal heart sounds. No murmur heard. No friction rub. No gallop.   Pulmonary:     Effort: Pulmonary effort is normal. No respiratory distress.     Breath sounds: Normal breath sounds. No wheezing or rales.     Comments: The heart sounds are normal, the ribs appear normal, there is no rashes but he does have tenderness over the left medial costal margin just left of sternum and xiphoid. Chest:     Chest wall: Tenderness present.  Abdominal:     General: Bowel sounds are normal. There is no distension.     Palpations: Abdomen is soft. There is no mass.     Tenderness: There is no abdominal tenderness.  Musculoskeletal:        General: No tenderness or edema. Normal range of motion.     Cervical back: Normal range of motion and neck supple.  Lymphadenopathy:     Cervical: No cervical adenopathy.  Skin:    General: Skin is warm and dry.     Findings: No erythema or rash.  Neurological:     Mental Status: He is alert.     Coordination: Coordination normal.  Psychiatric:        Mood and Affect: Mood and affect normal.        Behavior: Behavior normal.     ED Results / Procedures / Treatments   Labs (all labs ordered are listed, but only abnormal results are displayed) Labs Reviewed  BASIC METABOLIC PANEL - Abnormal; Notable for the following components:      Result Value   BUN 24 (*)    Calcium 8.5 (*)    All other components within normal limits  CBC  TSH  TROPONIN I (HIGH SENSITIVITY)    EKG EKG Interpretation  Date/Time:  Saturday November 26 2020 17:49:18 EST Ventricular Rate:  78 PR Interval:  158 QRS Duration: 98 QT Interval:  358 QTC Calculation: 408 R Axis:   35 Text Interpretation: Normal sinus rhythm  Incomplete right bundle branch block Borderline ECG since last tracing no significant change Confirmed by Eber Hong ([id number]) on 11/26/2020 8:04:00 PM   Radiology DG Chest 2 View  Result Date: 11/26/2020 CLINICAL DATA:  Chest pain. EXAM: CHEST - 2 VIEW COMPARISON:  November 20, 2020. FINDINGS:  The heart size and mediastinal contours are within normal limits. Both lungs are clear. No pneumothorax or pleural effusion is noted. The visualized skeletal structures are unremarkable. IMPRESSION: No active cardiopulmonary disease. Electronically Signed   By: Lupita Raider M.D.   On: 11/26/2020 18:44    Procedures Procedures (including critical care time)  Medications Ordered in ED Medications  ketorolac (TORADOL) 30 MG/ML injection 30 mg (has no administration in time range)    ED Course  I have reviewed the triage vital signs and the nursing notes.  Pertinent labs & imaging results that were available during my care of the patient were reviewed by me and considered in my medical decision making (see chart for details).    MDM Rules/Calculators/A&P                           The patient's exam is reassuring, there is no signs of abnormal cardiac activity or murmur  The patient's lab work is unremarkable without significant leukocytosis anemia or renal dysfunction  The patient's vital signs show some mild hypertension with a diastolic of 104, he is afebrile and has a normal heart rate  The patient's EKG is unremarkable, it does show normal sinus rhythm with an incomplete right bundle branch block but no change from his prior EKG and no ischemia  The troponin is negative the troponin is negative measuring at 17.  I will add a thyroid panel on  The patient is agreeable to follow-up in the outpatient setting, his chest x-ray reveals no abnormal findings, he is very stable for discharge and I suspect that he has a chest wall pain, this is costochondritis type pain.  He is scheduled to see  his family doctor on Tuesday for referral to cardiology   Lisionpril added as needed until f/u TSH and top neg Pt stable for d/c.  Final Clinical Impression(s) / ED Diagnoses Final diagnoses:  Intermittent left-sided chest pain    Rx / DC Orders ED Discharge Orders         Ordered    naproxen (NAPROSYN) 500 MG tablet  2 times daily        11/26/20 2226    lisinopril (ZESTRIL) 10 MG tablet  Daily        11/26/20 2228           Eber Hong, MD 11/26/20 2229

## 2020-11-26 NOTE — ED Triage Notes (Signed)
C/o L sided chest pain x 3 days with SOB.  Denies nausea and vomiting.

## 2020-11-26 NOTE — Discharge Instructions (Addendum)
NormalYour testing here has shown a normal thyroid test, a normal heart test your x-ray was unremarkable, and your pain does not seem to be related to a heart attack.  You will need to see your family doctor at your appointment and see the cardiologist and referral.  Please come back to the ER for severe worsening symptoms.  Please continue to take your blood pressure medication daily but take your blood pressure only once a day, it should be measured 2 hours after you take your medication.  Record this number in chart with your doctor.   If your blood pressure continues to be elevated despite taking this medication add lisinopril 10 mg a day.  I have included this prescription to your pharmacy

## 2020-11-29 DIAGNOSIS — I10 Essential (primary) hypertension: Secondary | ICD-10-CM | POA: Diagnosis not present

## 2020-11-29 DIAGNOSIS — R079 Chest pain, unspecified: Secondary | ICD-10-CM | POA: Diagnosis not present

## 2020-11-29 DIAGNOSIS — R609 Edema, unspecified: Secondary | ICD-10-CM | POA: Diagnosis not present

## 2020-11-29 DIAGNOSIS — R030 Elevated blood-pressure reading, without diagnosis of hypertension: Secondary | ICD-10-CM | POA: Diagnosis not present

## 2020-12-01 ENCOUNTER — Telehealth: Payer: Self-pay | Admitting: *Deleted

## 2020-12-01 NOTE — Telephone Encounter (Signed)
Notes received sent to referral for scheduling [facility]. [phone]

## 2020-12-02 DIAGNOSIS — I10 Essential (primary) hypertension: Secondary | ICD-10-CM | POA: Diagnosis not present

## 2020-12-02 DIAGNOSIS — R079 Chest pain, unspecified: Secondary | ICD-10-CM | POA: Diagnosis not present

## 2020-12-07 ENCOUNTER — Encounter: Payer: Self-pay | Admitting: Cardiovascular Disease

## 2020-12-07 MED FILL — NAPROXEN 500 MG TABS: 500 | 15 days supply | Qty: 30 | Fill #0
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
## 2020-12-08 ENCOUNTER — Other Ambulatory Visit: Payer: Self-pay | Admitting: Cardiovascular Disease

## 2020-12-08 ENCOUNTER — Encounter: Payer: Self-pay | Admitting: Cardiovascular Disease

## 2020-12-08 ENCOUNTER — Ambulatory Visit: Payer: Federal, State, Local not specified - PPO | Admitting: Cardiovascular Disease

## 2020-12-08 ENCOUNTER — Other Ambulatory Visit: Payer: Self-pay

## 2020-12-08 VITALS — BP 128/84 | HR 68 | Ht 66.0 in | Wt 165.0 lb

## 2020-12-08 DIAGNOSIS — R072 Precordial pain: Secondary | ICD-10-CM

## 2020-12-08 DIAGNOSIS — I10 Essential (primary) hypertension: Secondary | ICD-10-CM

## 2020-12-08 DIAGNOSIS — I200 Unstable angina: Secondary | ICD-10-CM

## 2020-12-08 MED ORDER — METOPROLOL TARTRATE 100 MG PO TABS: ORAL_TABLET | ORAL | 0 refills | Status: DC

## 2020-12-08 NOTE — Patient Instructions (Addendum)
Medication Instructions:  Your physician recommends that you continue on your current medications as directed. Please refer to the Current Medication list given to you today.  *If you need a refill on your cardiac medications before your next appointment, please call your pharmacy*   Lab Work: BMET, Lipid and Liver today  If you have labs (blood work) drawn today and your tests are completely normal, you will receive your results only by: Marland Kitchen MyChart Message (if you have MyChart) OR . A paper copy in the mail If you have any lab test that is abnormal or we need to change your treatment, we will call you to review the results.   Testing/Procedures: Your physician recommends that you have a Coronary CT performed.   Follow-Up: At [facility], you and your health needs are our priority.  As part of our continuing mission to provide you with exceptional heart care, we have created designated Provider Care Teams.  These Care Teams include your primary Cardiologist (physician) and Advanced Practice Providers (APPs -  Physician Assistants and Nurse Practitioners) who all work together to provide you with the care you need, when you need it.  We recommend signing up for the patient portal called "MyChart".  Sign up information is provided on this After Visit Summary.  MyChart is used to connect with patients for Virtual Visits (Telemedicine).  Patients are able to view lab/test results, encounter notes, upcoming appointments, etc.  Non-urgent messages can be sent to your provider as well.   To learn more about what you can do with MyChart, go to [email or web address].    Your next appointment:   3 month(s)  The format for your next appointment:   In Person  Provider:   You may see Dr. Mertie Moores or one of the following Advanced Practice Providers on your designated Care Team:    Richardson Dopp, PA-C  Vin [place], [place]    Other Instructions  Your cardiac CT will be scheduled at one  of the below locations:   [facility] [address] [place], [place] [address] [phone]  [place] [address] [place], [place] [address] [phone]  If scheduled at [facility], please arrive at the [facility] main entrance of [facility] 30 minutes prior to test start time. Proceed to the [facility] Radiology Department (first floor) to check-in and test prep.  If scheduled at [facility], please arrive 15 mins early for check-in and test prep.  Please follow these instructions carefully (unless otherwise directed):  Hold all erectile dysfunction medications at least 3 days (72 hrs) prior to test.  On the Night Before the Test: . Be sure to Drink plenty of water. . Do not consume any caffeinated/decaffeinated beverages or chocolate 12 hours prior to your test. . Do not take any antihistamines 12 hours prior to your test.  On the Day of the Test: . Drink plenty of water. Do not drink any water within one hour of the test. . Do not eat any food 4 hours prior to the test. . You may take your regular medications prior to the test.  . Take metoprolol (Lopressor) two hours prior to test. . HOLD Furosemide/Hydrochlorothiazide morning of the test       After the Test: . Drink plenty of water. . After receiving IV contrast, you may experience a mild flushed feeling. This is normal. . On occasion, you may experience a  mild rash up to 24 hours after the test. This is not dangerous. If this occurs, you can take Benadryl 25 mg and increase your fluid intake. . If you experience trouble breathing, this can be serious. If it is severe call 911 IMMEDIATELY. If it is mild, please call our office. . If you take any of these medications: Glipizide/Metformin, Avandament, Glucavance, please do not take 48 hours after completing test unless otherwise instructed.   Once we have  confirmed authorization from your insurance company, we will call you to set up a date and time for your test. Based on how quickly your insurance processes prior authorizations requests, please allow up to 4 weeks to be contacted for scheduling your Cardiac CT appointment. Be advised that routine Cardiac CT appointments could be scheduled as many as 8 weeks after your provider has ordered it.  For non-scheduling related questions, please contact the cardiac imaging nurse navigator should you have any questions/concerns: Marchia Bond, Cardiac Imaging Nurse Navigator Burley Saver, Interim Cardiac Imaging Nurse [place] and Vascular Services Direct Office Dial: [phone]   For scheduling needs, including cancellations and rescheduling, please call [place], [phone].

## 2020-12-09 ENCOUNTER — Other Ambulatory Visit (HOSPITAL_BASED_OUTPATIENT_CLINIC_OR_DEPARTMENT_OTHER): Payer: Self-pay | Admitting: Family Medicine

## 2020-12-09 LAB — BASIC METABOLIC PANEL
BUN/Creatinine Ratio: 22 — ABNORMAL HIGH (ref 9–20)
BUN: 24 mg/dL (ref 6–24)
CO2: 25 mmol/L (ref 20–29)
Calcium: 8.8 mg/dL (ref 8.7–10.2)
Chloride: 97 mmol/L (ref 96–106)
Creatinine, Ser: 1.11 mg/dL (ref 0.76–1.27)
GFR calc Af Amer: 86 mL/min/{1.73_m2} (ref >59)
GFR calc non Af Amer: 74 mL/min/{1.73_m2} (ref >59)
Glucose: 91 mg/dL (ref 65–99)
Potassium: 5.2 mmol/L (ref 3.5–5.2)
Sodium: 131 mmol/L — ABNORMAL LOW (ref 134–144)

## 2020-12-09 LAB — LIPID PANEL
Chol/HDL Ratio: 4.8 ratio (ref 0.0–5.0)
Cholesterol, Total: 295 mg/dL — ABNORMAL HIGH (ref 100–199)
HDL: 62 mg/dL (ref >39)
LDL Chol Calc (NIH): 181 mg/dL — ABNORMAL HIGH (ref 0–99)
Triglycerides: 273 mg/dL — ABNORMAL HIGH (ref 0–149)
VLDL Cholesterol Cal: 52 mg/dL — ABNORMAL HIGH (ref 5–40)

## 2020-12-09 LAB — HEPATIC FUNCTION PANEL
ALT: 14 IU/L (ref 0–44)
AST: 26 IU/L (ref 0–40)
Albumin: 2.4 g/dL — ABNORMAL LOW (ref 3.8–4.9)
Alkaline Phosphatase: 60 IU/L (ref 44–121)
Bilirubin Total: <0.2 mg/dL (ref 0.0–1.2)
Bilirubin, Direct: <0.1 mg/dL (ref 0.00–0.40)
Total Protein: 5 g/dL — ABNORMAL LOW (ref 6.0–8.5)

## 2020-12-09 MED FILL — ROSUVASTATIN CALCIUM 10 MG: 10 | 30 days supply | Qty: 30 | Fill #0

## 2020-12-09 MED FILL — METOPROLOL TARTRATE 100 MG: 100 | 1 days supply | Qty: 1 | Fill #0

## 2020-12-14 ENCOUNTER — Ambulatory Visit: Payer: BLUE CROSS/BLUE SHIELD | Admitting: Cardiology

## 2020-12-22 ENCOUNTER — Telehealth (HOSPITAL_COMMUNITY): Payer: Self-pay | Admitting: *Deleted

## 2020-12-22 NOTE — Telephone Encounter (Signed)
Reaching out to patient to offer assistance regarding upcoming cardiac imaging study; pt verbalizes understanding of appt date/time, parking situation and where to check in, pre-test NPO status and medications ordered, and verified current allergies; name and call back number provided for further questions should they arise  Ramel Tobon RN Navigator Cardiac Imaging [place] [facility] [phone] office [phone] cell  

## 2020-12-23 ENCOUNTER — Encounter (HOSPITAL_COMMUNITY): Payer: Self-pay

## 2020-12-23 ENCOUNTER — Ambulatory Visit (HOSPITAL_COMMUNITY)
Admission: RE | Admit: 2020-12-23 | Discharge: 2020-12-23 | Disposition: A | Discharge status: Home / Self Care | Payer: Federal, State, Local not specified - PPO | Source: Ambulatory Visit | Attending: Cardiovascular Disease | Admitting: Cardiovascular Disease

## 2020-12-23 ENCOUNTER — Other Ambulatory Visit: Payer: Self-pay

## 2020-12-23 DIAGNOSIS — R072 Precordial pain: Secondary | ICD-10-CM

## 2020-12-23 DIAGNOSIS — I10 Essential (primary) hypertension: Secondary | ICD-10-CM | POA: Insufficient documentation

## 2020-12-23 DIAGNOSIS — I2510 Atherosclerotic heart disease of native coronary artery without angina pectoris: Secondary | ICD-10-CM | POA: Diagnosis not present

## 2020-12-23 MED ORDER — NITROGLYCERIN 0.4 MG SL SUBL: 0.8000 mg | SUBLINGUAL_TABLET | Freq: Once | SUBLINGUAL | Status: AC

## 2020-12-23 MED ORDER — IOHEXOL 350 MG/ML SOLN
80.0000 mL | Freq: Once | INTRAVENOUS | Status: AC | PRN
  Administered 2020-12-23: 80 mL via INTRAVENOUS

## 2020-12-23 MED ORDER — NITROGLYCERIN 0.4 MG SL SUBL
SUBLINGUAL_TABLET | SUBLINGUAL | Status: AC
  Administered 2020-12-23: 0.8 mg via SUBLINGUAL
  Filled 2020-12-23: qty 2

## 2020-12-25 ENCOUNTER — Telehealth: Payer: Self-pay | Admitting: Cardiovascular Disease

## 2020-12-25 ENCOUNTER — Other Ambulatory Visit: Payer: Self-pay | Admitting: Cardiovascular Disease

## 2020-12-25 MED ORDER — HYDROCHLOROTHIAZIDE 25 MG PO TABS: 25.0000 mg | ORAL_TABLET | Freq: Every day | ORAL | 3 refills | Status: DC

## 2020-12-25 MED ORDER — NITROGLYCERIN 0.4 MG SL SUBL: 0.4000 mg | SUBLINGUAL_TABLET | SUBLINGUAL | 3 refills | Status: DC | PRN

## 2020-12-25 NOTE — Telephone Encounter (Signed)
I had called patients wife, [name] ( nurse on 6E) , to discuss the results of the coronary Ct angio Pt has severe stenosis of LCx and RCA with at least moderate stenosis of the LAD He has not had any further episodes of CP since his visit to the er Has also run out of his HCTZ  [name] returned my call and we discussed the Coro CT angio We discussed cath ( risks, benefits, optionss ) They understand and agree to proceed. Will schedule it for this week I have sent in a prescription [facility] [place]. Also refilled his HTCZ and had him start an ASA 81 mg a day   Will schedule him for cath with possible PCI this week .     Kristeen Miss, MD  12/25/2020 12:06 PM    [facility] [address] [place],  [address] [place], [place]  [address] Phone: [phone]; Fax: [phone]

## 2020-12-25 NOTE — Progress Notes (Signed)
Call wife, [name], Pt needs a cath  Have discussed risks, benefits, options. He understands and agrees to proceed.      Kristeen Miss, MD  12/25/2020 12:00 PM    [facility] [address] [place],  [address] [place], [place]  [address] Phone: [phone]; Fax: [phone]

## 2020-12-26 DIAGNOSIS — I2510 Atherosclerotic heart disease of native coronary artery without angina pectoris: Secondary | ICD-10-CM | POA: Diagnosis not present

## 2020-12-26 NOTE — Telephone Encounter (Signed)
Cath scheduled for Thursday 2/17 with Dr. Okey Dupre at 7:30am.

## 2020-12-26 NOTE — Telephone Encounter (Signed)
-----   Message from Vesta Mixer, MD sent at 12/25/2020 12:09 PM EST ----- Pt has an abnormal Cor CT angio.  Will need a cath this week.  Please discuss with me Monday . Thanks

## 2020-12-27 ENCOUNTER — Other Ambulatory Visit (HOSPITAL_COMMUNITY)
Admission: RE | Admit: 2020-12-27 | Discharge: 2020-12-27 | Disposition: A | Discharge status: Home / Self Care | Payer: Federal, State, Local not specified - PPO | Source: Ambulatory Visit | Attending: Internal Medicine | Admitting: Internal Medicine

## 2020-12-27 ENCOUNTER — Other Ambulatory Visit: Payer: Federal, State, Local not specified - PPO | Admitting: *Deleted

## 2020-12-27 ENCOUNTER — Telehealth: Payer: Self-pay | Admitting: *Deleted

## 2020-12-27 ENCOUNTER — Other Ambulatory Visit: Payer: Self-pay

## 2020-12-27 DIAGNOSIS — E871 Hypo-osmolality and hyponatremia: Secondary | ICD-10-CM

## 2020-12-27 DIAGNOSIS — I25110 Atherosclerotic heart disease of native coronary artery with unstable angina pectoris: Secondary | ICD-10-CM | POA: Diagnosis not present

## 2020-12-27 DIAGNOSIS — Z20822 Contact with and (suspected) exposure to covid-19: Secondary | ICD-10-CM | POA: Insufficient documentation

## 2020-12-27 DIAGNOSIS — Z01812 Encounter for preprocedural laboratory examination: Secondary | ICD-10-CM

## 2020-12-27 DIAGNOSIS — R072 Precordial pain: Secondary | ICD-10-CM | POA: Diagnosis not present

## 2020-12-27 DIAGNOSIS — Z8249 Family history of ischemic heart disease and other diseases of the circulatory system: Secondary | ICD-10-CM | POA: Diagnosis not present

## 2020-12-27 DIAGNOSIS — I10 Essential (primary) hypertension: Secondary | ICD-10-CM | POA: Diagnosis not present

## 2020-12-27 DIAGNOSIS — Z79899 Other long term (current) drug therapy: Secondary | ICD-10-CM | POA: Diagnosis not present

## 2020-12-27 DIAGNOSIS — I2582 Chronic total occlusion of coronary artery: Secondary | ICD-10-CM | POA: Diagnosis not present

## 2020-12-27 DIAGNOSIS — E785 Hyperlipidemia, unspecified: Secondary | ICD-10-CM | POA: Diagnosis not present

## 2020-12-27 LAB — SARS CORONAVIRUS 2 (TAT 6-24 HRS): SARS Coronavirus 2: NEGATIVE

## 2020-12-27 NOTE — Telephone Encounter (Addendum)
Pt contacted pre-catheterization scheduled at [facility] for: Thursday December 29, 2020 7:30 AM Verified arrival time and place: [facility] Main Entrance [facility]) at: 5:30 AM   No solid food after midnight prior to cath, clear liquids until 5 AM day of procedure.  Hold: HCTZ-AM of procedure  Except hold medications AM meds can be  taken pre-cath with sips of water including: ASA 81 mg   Confirmed patient has responsible adult to drive home post procedure and be with patient first 24 hours after arriving home: yes  You are allowed ONE visitor in the waiting room during the time you are at the hospital for your procedure. Both you and your visitor must wear a mask once you enter the hospital.    Reviewed procedure/mask/visitor instructions with patient's wife (DPR).   Most recent CBC 11/26/20-more than 30 days from procedure date -will ask patient to get CBC today prior to pre-procedure COVID-19 test.

## 2020-12-28 LAB — BASIC METABOLIC PANEL
BUN/Creatinine Ratio: 22 — ABNORMAL HIGH (ref 9–20)
BUN: 25 mg/dL — ABNORMAL HIGH (ref 6–24)
CO2: 21 mmol/L (ref 20–29)
Calcium: 8.7 mg/dL (ref 8.7–10.2)
Chloride: 105 mmol/L (ref 96–106)
Creatinine, Ser: 1.12 mg/dL (ref 0.76–1.27)
GFR calc Af Amer: 85 mL/min/{1.73_m2} (ref >59)
GFR calc non Af Amer: 74 mL/min/{1.73_m2} (ref >59)
Glucose: 89 mg/dL (ref 65–99)
Potassium: 4.6 mmol/L (ref 3.5–5.2)
Sodium: 140 mmol/L (ref 134–144)

## 2020-12-28 LAB — CBC
Hematocrit: 45.6 % (ref 37.5–51.0)
Hemoglobin: 15.8 g/dL (ref 13.0–17.7)
MCH: 31.3 pg (ref 26.6–33.0)
MCHC: 34.6 g/dL (ref 31.5–35.7)
MCV: 91 fL (ref 79–97)
Platelets: 272 10*3/uL (ref 150–450)
RBC: 5.04 x10E6/uL (ref 4.14–5.80)
RDW: 12.5 % (ref 11.6–15.4)
WBC: 7 10*3/uL (ref 3.4–10.8)

## 2020-12-29 ENCOUNTER — Ambulatory Visit (HOSPITAL_COMMUNITY)
Admission: RE | Admit: 2020-12-29 | Discharge: 2020-12-29 | Disposition: A | Discharge status: Home / Self Care | Payer: Federal, State, Local not specified - PPO | Attending: Internal Medicine | Admitting: Internal Medicine

## 2020-12-29 ENCOUNTER — Other Ambulatory Visit (HOSPITAL_COMMUNITY): Payer: Self-pay | Admitting: Internal Medicine

## 2020-12-29 ENCOUNTER — Ambulatory Visit (HOSPITAL_BASED_OUTPATIENT_CLINIC_OR_DEPARTMENT_OTHER): Payer: Federal, State, Local not specified - PPO

## 2020-12-29 ENCOUNTER — Other Ambulatory Visit: Payer: Self-pay

## 2020-12-29 ENCOUNTER — Encounter (HOSPITAL_COMMUNITY)
Admission: RE | Disposition: A | Discharge status: Home / Self Care | Payer: Self-pay | Source: Home / Self Care | Attending: Internal Medicine

## 2020-12-29 DIAGNOSIS — Z79899 Other long term (current) drug therapy: Secondary | ICD-10-CM | POA: Diagnosis not present

## 2020-12-29 DIAGNOSIS — E785 Hyperlipidemia, unspecified: Secondary | ICD-10-CM | POA: Diagnosis not present

## 2020-12-29 DIAGNOSIS — I25110 Atherosclerotic heart disease of native coronary artery with unstable angina pectoris: Secondary | ICD-10-CM | POA: Insufficient documentation

## 2020-12-29 DIAGNOSIS — Z01810 Encounter for preprocedural cardiovascular examination: Secondary | ICD-10-CM

## 2020-12-29 DIAGNOSIS — I10 Essential (primary) hypertension: Secondary | ICD-10-CM | POA: Insufficient documentation

## 2020-12-29 DIAGNOSIS — Z20822 Contact with and (suspected) exposure to covid-19: Secondary | ICD-10-CM | POA: Insufficient documentation

## 2020-12-29 DIAGNOSIS — I2582 Chronic total occlusion of coronary artery: Secondary | ICD-10-CM | POA: Diagnosis not present

## 2020-12-29 DIAGNOSIS — R931 Abnormal findings on diagnostic imaging of heart and coronary circulation: Secondary | ICD-10-CM | POA: Diagnosis present

## 2020-12-29 DIAGNOSIS — I2510 Atherosclerotic heart disease of native coronary artery without angina pectoris: Secondary | ICD-10-CM | POA: Diagnosis not present

## 2020-12-29 DIAGNOSIS — Z8249 Family history of ischemic heart disease and other diseases of the circulatory system: Secondary | ICD-10-CM | POA: Diagnosis not present

## 2020-12-29 DIAGNOSIS — I200 Unstable angina: Secondary | ICD-10-CM | POA: Diagnosis present

## 2020-12-29 HISTORY — PX: LEFT HEART CATH AND CORONARY ANGIOGRAPHY: CATH118249

## 2020-12-29 LAB — ECHOCARDIOGRAM COMPLETE
Area-P 1/2: 2.24 cm2
Height: 66 in
S' Lateral: 3.3 cm
Weight: 2560 oz

## 2020-12-29 SURGERY — LEFT HEART CATH AND CORONARY ANGIOGRAPHY
Anesthesia: LOCAL

## 2020-12-29 MED ORDER — HEPARIN SODIUM (PORCINE) 1000 UNIT/ML IJ SOLN
INTRAMUSCULAR | Status: DC | PRN
  Administered 2020-12-29: 3500 [IU] via INTRAVENOUS

## 2020-12-29 MED ORDER — LABETALOL HCL 5 MG/ML IV SOLN: 10.0000 mg | INTRAVENOUS | Status: DC | PRN

## 2020-12-29 MED ORDER — SODIUM CHLORIDE 0.9% FLUSH: 3.0000 mL | Freq: Two times a day (BID) | INTRAVENOUS | Status: DC

## 2020-12-29 MED ORDER — HEPARIN (PORCINE) IN NACL 1000-0.9 UT/500ML-% IV SOLN
INTRAVENOUS | Status: AC
  Filled 2020-12-29: qty 1000

## 2020-12-29 MED ORDER — VERAPAMIL HCL 2.5 MG/ML IV SOLN
INTRAVENOUS | Status: DC | PRN
  Administered 2020-12-29: 10 mL via INTRA_ARTERIAL

## 2020-12-29 MED ORDER — LIDOCAINE HCL (PF) 1 % IJ SOLN
INTRAMUSCULAR | Status: DC | PRN
  Administered 2020-12-29: 2 mL via INTRADERMAL

## 2020-12-29 MED ORDER — ASPIRIN 81 MG PO CHEW: 81.0000 mg | CHEWABLE_TABLET | ORAL | Status: DC

## 2020-12-29 MED ORDER — SODIUM CHLORIDE 0.9% FLUSH: 3.0000 mL | INTRAVENOUS | Status: DC | PRN

## 2020-12-29 MED ORDER — IOHEXOL 350 MG/ML SOLN
INTRAVENOUS | Status: DC | PRN
  Administered 2020-12-29: 45 mL via INTRA_ARTERIAL

## 2020-12-29 MED ORDER — METOPROLOL TARTRATE 25 MG PO TABS: 25.0000 mg | ORAL_TABLET | Freq: Two times a day (BID) | ORAL | 5 refills | Status: DC

## 2020-12-29 MED ORDER — MIDAZOLAM HCL 2 MG/2ML IJ SOLN
INTRAMUSCULAR | Status: DC | PRN
  Administered 2020-12-29: 1 mg via INTRAVENOUS

## 2020-12-29 MED ORDER — SODIUM CHLORIDE 0.9 % IV SOLN: 250.0000 mL | INTRAVENOUS | Status: DC | PRN

## 2020-12-29 MED ORDER — HEPARIN SODIUM (PORCINE) 1000 UNIT/ML IJ SOLN
INTRAMUSCULAR | Status: AC
  Filled 2020-12-29: qty 1

## 2020-12-29 MED ORDER — FENTANYL CITRATE (PF) 100 MCG/2ML IJ SOLN
INTRAMUSCULAR | Status: AC
  Filled 2020-12-29: qty 2

## 2020-12-29 MED ORDER — ONDANSETRON HCL 4 MG/2ML IJ SOLN: 4.0000 mg | Freq: Four times a day (QID) | INTRAMUSCULAR | Status: DC | PRN

## 2020-12-29 MED ORDER — VERAPAMIL HCL 2.5 MG/ML IV SOLN
INTRAVENOUS | Status: AC
  Filled 2020-12-29: qty 2

## 2020-12-29 MED ORDER — HYDRALAZINE HCL 20 MG/ML IJ SOLN: 10.0000 mg | INTRAMUSCULAR | Status: DC | PRN

## 2020-12-29 MED ORDER — LIDOCAINE HCL (PF) 1 % IJ SOLN
INTRAMUSCULAR | Status: AC
  Filled 2020-12-29: qty 30

## 2020-12-29 MED ORDER — SODIUM CHLORIDE 0.9 % WEIGHT BASED INFUSION
3.0000 mL/kg/h | INTRAVENOUS | Status: AC
  Administered 2020-12-29: 3 mL/kg/h via INTRAVENOUS

## 2020-12-29 MED ORDER — FENTANYL CITRATE (PF) 100 MCG/2ML IJ SOLN
INTRAMUSCULAR | Status: DC | PRN
  Administered 2020-12-29: 50 ug via INTRAVENOUS

## 2020-12-29 MED ORDER — MIDAZOLAM HCL 2 MG/2ML IJ SOLN
INTRAMUSCULAR | Status: AC
  Filled 2020-12-29: qty 2

## 2020-12-29 MED ORDER — ACETAMINOPHEN 325 MG PO TABS: 650.0000 mg | ORAL_TABLET | ORAL | Status: DC | PRN

## 2020-12-29 MED ORDER — SODIUM CHLORIDE 0.9 % WEIGHT BASED INFUSION: 1.0000 mL/kg/h | INTRAVENOUS | Status: DC

## 2020-12-29 MED ORDER — HEPARIN (PORCINE) IN NACL 1000-0.9 UT/500ML-% IV SOLN
INTRAVENOUS | Status: DC | PRN
  Administered 2020-12-29 (×2): 500 mL

## 2020-12-29 MED ORDER — SODIUM CHLORIDE 0.9 % IV SOLN: INTRAVENOUS | Status: DC

## 2020-12-29 MED FILL — METOPROLOL TARTRATE 25 MG T: 25 | 30 days supply | Qty: 60 | Fill #0

## 2020-12-29 SURGICAL SUPPLY — 11 items
BAG SNAP BAND KOVER 36X36 (MISCELLANEOUS) ×1 IMPLANT
CATH 5FR JL3.5 JR4 ANG PIG MP (CATHETERS) ×1 IMPLANT
COVER DOME SNAP 22 D (MISCELLANEOUS) ×1 IMPLANT
DEVICE RAD COMP TR BAND LRG (VASCULAR PRODUCTS) ×1 IMPLANT
GLIDESHEATH SLEND SS 6F .021 (SHEATH) ×1 IMPLANT
GUIDEWIRE INQWIRE 1.5J.035X260 (WIRE) IMPLANT
INQWIRE 1.5J .035X260CM (WIRE) ×2
KIT HEART LEFT (KITS) ×2 IMPLANT
PACK CARDIAC CATHETERIZATION (CUSTOM PROCEDURE TRAY) ×2 IMPLANT
TRANSDUCER W/STOPCOCK (MISCELLANEOUS) ×2 IMPLANT
TUBING CIL FLEX 10 FLL-RA (TUBING) ×2 IMPLANT

## 2020-12-29 NOTE — Progress Notes (Signed)
Pre-CABG assessment done   Please see CV Proc for preliminary results.   Clint Guy, RVT

## 2020-12-29 NOTE — Discharge Instructions (Signed)
Radial Site Care  This sheet gives you information about how to care for yourself after your procedure. Your health care provider may also give you more specific instructions. If you have problems or questions, contact your health care provider. What can I expect after the procedure? After the procedure, it is common to have:  Bruising and tenderness at the catheter insertion area. Follow these instructions at home: Medicines  Take over-the-counter and prescription medicines only as told by your health care provider. Insertion site care  Follow instructions from your health care provider about how to take care of your insertion site. Make sure you: ? Wash your hands with soap and water before you change your bandage (dressing). If soap and water are not available, use hand sanitizer. ? Change your dressing as told by your health care provider. ? Leave stitches (sutures), skin glue, or adhesive strips in place. These skin closures may need to stay in place for 2 weeks or longer. If adhesive strip edges start to loosen and curl up, you may trim the loose edges. Do not remove adhesive strips completely unless your health care provider tells you to do that.  Check your insertion site every day for signs of infection. Check for: ? Redness, swelling, or pain. ? Fluid or blood. ? Pus or a bad smell. ? Warmth.  Do not take baths, swim, or use a hot tub until your health care provider approves.  You may shower 24-48 hours after the procedure, or as directed by your health care provider. ? Remove the dressing and gently wash the site with plain soap and water. ? Pat the area dry with a clean towel. ? Do not rub the site. That could cause bleeding.  Do not apply powder or lotion to the site. Activity  For 24 hours after the procedure, or as directed by your health care provider: ? Do not flex or bend the affected arm. ? Do not push or pull heavy objects with the affected arm. ? Do not drive  yourself home from the hospital or clinic. You may drive 24 hours after the procedure unless your health care provider tells you not to. ? Do not operate machinery or power tools.  Do not lift anything that is heavier than 10 lb (4.5 kg), or the limit that you are told, until your health care provider says that it is safe.  Ask your health care provider when it is okay to: ? Return to work or school. ? Resume usual physical activities or sports. ? Resume sexual activity.   General instructions  If the catheter site starts to bleed, raise your arm and put firm pressure on the site. If the bleeding does not stop, get help right away. This is a medical emergency.  If you went home on the same day as your procedure, a responsible adult should be with you for the first 24 hours after you arrive home.  Keep all follow-up visits as told by your health care provider. This is important. Contact a health care provider if:  You have a fever.  You have redness, swelling, or yellow drainage around your insertion site. Get help right away if:  You have unusual pain at the radial site.  The catheter insertion area swells very fast.  The insertion area is bleeding, and the bleeding does not stop when you hold steady pressure on the area.  Your arm or hand becomes pale, cool, tingly, or numb. These symptoms may represent a serious   problem that is an emergency. Do not wait to see if the symptoms will go away. Get medical help right away. Call your local emergency services (911 in the [place] Do not drive yourself to the hospital. Summary  After the procedure, it is common to have bruising and tenderness at the site.  Follow instructions from your health care provider about how to take care of your radial site wound. Check the wound every day for signs of infection.  Do not lift anything that is heavier than 10 lb (4.5 kg), or the limit that you are told, until your health care provider says that it  is safe. This information is not intended to replace advice given to you by your health care provider. Make sure you discuss any questions you have with your health care provider. Document Revised: 12/04/2017 Document Reviewed: 12/04/2017 Elsevier Patient Education  2021 [employer]vier Inc.  

## 2020-12-29 NOTE — Progress Notes (Incomplete)
  Echocardiogram 2D Echocardiogram has been performed.  Augustine Radar 12/29/2020, 11:07 AM

## 2020-12-29 NOTE — Interval H&P Note (Signed)
History and Physical Interval Note:  12/29/2020 7:12 AM  Rodney Strickland  has presented today for surgery, with the diagnosis of chest pain and abnormal cardiac CTA.  The various methods of treatment have been discussed with the patient and family. After consideration of risks, benefits and other options for treatment, the patient has consented to  Procedure(s): LEFT HEART CATH AND CORONARY ANGIOGRAPHY (N/A) as a surgical intervention.  The patient's history has been reviewed, patient examined, no change in status, stable for surgery.  I have reviewed the patient's chart and labs.  Questions were answered to the patient's satisfaction.    Cath Lab Visit (complete for each Cath Lab visit)  Clinical Evaluation Leading to the Procedure:   ACS: No.  Non-ACS:    Anginal Classification: CCS IV  Anti-ischemic medical therapy: No Therapy  Non-Invasive Test Results: High-risk stress test findings: cardiac mortality >3%/year (3-vessel CAD positive by CT FFR on coronary CTA) Prior CABG: No previous CABG   [name]

## 2020-12-30 ENCOUNTER — Encounter (HOSPITAL_COMMUNITY): Payer: Self-pay | Admitting: Internal Medicine

## 2021-01-02 ENCOUNTER — Other Ambulatory Visit: Payer: Self-pay

## 2021-01-02 ENCOUNTER — Encounter: Payer: Self-pay | Admitting: Cardiothoracic Surgery

## 2021-01-02 ENCOUNTER — Institutional Professional Consult (permissible substitution): Payer: Federal, State, Local not specified - PPO | Admitting: Cardiothoracic Surgery

## 2021-01-02 VITALS — BP 129/79 | HR 71 | Resp 20 | Ht 66.0 in

## 2021-01-02 DIAGNOSIS — I25119 Atherosclerotic heart disease of native coronary artery with unspecified angina pectoris: Secondary | ICD-10-CM

## 2021-01-02 DIAGNOSIS — I2510 Atherosclerotic heart disease of native coronary artery without angina pectoris: Secondary | ICD-10-CM | POA: Diagnosis not present

## 2021-01-02 NOTE — Progress Notes (Signed)
Surgical Instructions   Your procedure is scheduled on Thursday, February 24th.  Report to [facility] Main Entrance "A" at 5:30 A.M., then check in with the Admitting office.  Call this number if you have problems the morning of surgery:  [phone]   If you have any questions prior to your surgery date call [phone]: Open Monday-Friday 8am-4pm   Remember:  Do not eat or drink after midnight the night before your surgery    Take these medicines the morning of surgery with A SIP OF WATER  allopurinol (ZYLOPRIM)  metoprolol tartrate (LOPRESSOR)  If needed: loratadine (CLARITIN), nitroGLYCERIN (NITROSTAT)    Follow your surgeon's instructions on when to stop Aspirin.  If no instructions were given by your surgeon then you will need to call the office to get those instructions.    As of today, STOP taking Aleve, Naproxen, Ibuprofen, Motrin, Advil, Goody's, BC's, all herbal medications, fish oil, and all vitamins.                     Do not wear jewelry            Do not wear lotions, powders, colognes, or deodorant.            Men may shave face and neck.            Do not bring valuables to the hospital.            [facility] is not responsible for any belongings or valuables.  Do NOT Smoke (Tobacco/Vaping) or drink Alcohol 24 hours prior to your procedure If you use a CPAP at night, you may bring all equipment for your overnight stay.   Contacts, glasses, dentures or bridgework may not be worn into surgery, please bring cases for these belongings   For patients admitted to the hospital, discharge time will be determined by your treatment team.   Patients discharged the day of surgery will not be allowed to drive home, and someone needs to stay with them for 24 hours.  Special instructions:   [place]- Preparing For Surgery  Before surgery, you can play an important role. Because skin is not sterile, your skin needs to be as free of germs as possible. You can reduce the  number of germs on your skin by washing with CHG (chlorahexidine gluconate) Soap before surgery.  CHG is an antiseptic cleaner which kills germs and bonds with the skin to continue killing germs even after washing.    Oral Hygiene is also important to reduce your risk of infection.  Remember - BRUSH YOUR TEETH THE MORNING OF SURGERY WITH YOUR REGULAR TOOTHPASTE  Please do not use if you have an allergy to CHG or antibacterial soaps. If your skin becomes reddened/irritated stop using the CHG.  Do not shave (including legs and underarms) for at least 48 hours prior to first CHG shower. It is OK to shave your face.  Please follow these instructions carefully.   1. Shower the NIGHT BEFORE SURGERY and the MORNING OF SURGERY  2. If you chose to wash your hair, wash your hair first as usual with your normal shampoo.  3. After you shampoo, rinse your hair and body thoroughly to remove the shampoo.  4. Wash Face and genitals (private parts) with your normal soap.   5.  Shower the NIGHT BEFORE SURGERY and the MORNING OF SURGERY with CHG Soap.   6. Use CHG Soap as you would any other liquid soap.  You can apply CHG directly to the skin and wash gently with a scrungie or a clean washcloth.   7. Apply the CHG Soap to your body ONLY FROM THE NECK DOWN.  Do not use on open wounds or open sores. Avoid contact with your eyes, ears, mouth and genitals (private parts). Wash Face and genitals (private parts)  with your normal soap.   8. Wash thoroughly, paying special attention to the area where your surgery will be performed.  9. Thoroughly rinse your body with warm water from the neck down.  10. DO NOT shower/wash with your normal soap after using and rinsing off the CHG Soap.  11. Pat yourself dry with a CLEAN TOWEL.  12. Wear CLEAN PAJAMAS to bed the night before surgery  13. Place CLEAN SHEETS on your bed the night before your surgery  14. DO NOT SLEEP WITH PETS.  Day of Surgery: Shower with  CHG soap Wear Clean/Comfortable clothing the morning of surgery Do not apply any deodorants/lotions.   Remember to brush your teeth WITH YOUR REGULAR TOOTHPASTE.   Please read over the following fact sheets that you were given.

## 2021-01-02 NOTE — Progress Notes (Signed)
[address]       [place] [address]             [phone]     CARDIOTHORACIC SURGERY CONSULTATION REPORT  Referring Provider is End, Cristal Deer, MD Primary Cardiologist is No primary care provider on file. PCP is Juliann Pares, DO  Chief Complaint  Patient presents with  . Coronary Artery Disease    Initial surgical consult, ECHO 2/17, cath 2/17, CT cardiac 2/11    HPI:  55 year old man in good medical condition reported fatigue recently.  He also had vague chest pain with exertion.  He underwent CT scan including coronary angiogram which was positive.  This was followed up up by left heart catheterization.  This demonstrated severe multivessel coronary artery disease.  He is referred for consideration of CABG.  He has no known prior MIs.  He denies any arrhythmias.  He has never had a stroke or PEs.  Past Medical History:  Diagnosis Date  . Hypercholesteremia   . Hypertension   . Injury of both lungs 1986   water in lungs    Past Surgical History:  Procedure Laterality Date  . CHOLECYSTECTOMY    . LEFT HEART CATH AND CORONARY ANGIOGRAPHY N/A 12/29/2020   Procedure: LEFT HEART CATH AND CORONARY ANGIOGRAPHY;  Surgeon: Yvonne Kendall, MD;  Location: [facility] INVASIVE CV LAB;  Service: Cardiovascular;  Laterality: N/A;  . THORACENTESIS      Family History  Problem Relation Age of Onset  . Diabetes type II Mother   . Hypertension Father   . Stroke Father   . Hypertension Sister     Social History   Socioeconomic History  . Marital status: Married    Spouse name: Not on file  . Number of children: 3  . Years of education: Not on file  . Highest education level: Not on file  Occupational History  . Not on file  Tobacco Use  . Smoking status: Never Smoker  . Smokeless tobacco: Never Used  Vaping Use  . Vaping Use: Never used  Substance and Sexual Activity  . Alcohol use: No  . Drug use: No  . Sexual activity: Not on file  Other  Topics Concern  . Not on file  Social History Narrative  . Not on file   Social Determinants of Health   Financial Resource Strain: Not on file  Food Insecurity: Not on file  Transportation Needs: Not on file  Physical Activity: Not on file  Stress: Not on file  Social Connections: Not on file  Intimate Partner Violence: Not on file    Current Outpatient Medications  Medication Sig Dispense Refill  . allopurinol (ZYLOPRIM) 100 MG tablet Take 100 mg by mouth daily.    Marland Kitchen aspirin EC 81 MG tablet Take 81 mg by mouth daily. Swallow whole.    . cholecalciferol (VITAMIN D3) 25 MCG (1000 UNIT) tablet Take 1,000 Units by mouth daily.    . hydrochlorothiazide (HYDRODIURIL) 25 MG tablet Take 1 tablet (25 mg total) by mouth daily. 90 tablet 3  . ibuprofen (ADVIL,MOTRIN) 200 MG tablet Take 400-600 mg by mouth every 6 (six) hours as needed for moderate pain.    Marland Kitchen lisinopril (ZESTRIL) 10 MG tablet Take 1 tablet (10 mg total) by mouth daily. 30 tablet 1  . loratadine (CLARITIN) 10 MG tablet Take 10 mg by mouth daily as needed for allergies.    . metoprolol tartrate (LOPRESSOR) 25 MG tablet Take 1  tablet (25 mg total) by mouth 2 (two) times daily. 60 tablet 5  . Multiple Vitamins-Minerals (MULTIVITAMIN WITH MINERALS) tablet Take 1 tablet by mouth daily.    . nitroGLYCERIN (NITROSTAT) 0.4 MG SL tablet Place 1 tablet (0.4 mg total) under the tongue every 5 (five) minutes as needed for chest pain. 25 tablet 3  . rosuvastatin (CRESTOR) 10 MG tablet Take 10 mg by mouth at bedtime.    . vitamin C (ASCORBIC ACID) 500 MG tablet Take 500 mg by mouth daily.    Marland Kitchen zinc gluconate 50 MG tablet Take 50 mg by mouth daily.     No current facility-administered medications for this visit.    No Known Allergies    Review of Systems:   General:  Decreased energy level no change in weight  Cardiac:  As per HPI  Respiratory:  Remote history of "water on the lungs"  GI:   No abdominal pain pain or bleeding;  history of cholecystectomy  GU:   No history of kidney disease or prostate disease  Vascular:  No history of claudication or venous disease  Neuro:   Occasional headaches but no strokes or TIAs  Musculoskeletal: No musculoskeletal disorders  Skin:   Negative  Psych:   Negative  Eyes:   Negative  ENT:   Negative  Hematologic:  Denies bleeding or clotting disorders  Endocrine:  Not diabetic     Physical Exam:   BP 129/79 (BP Location: Right Arm, Patient Position: Sitting)   Pulse 71   Resp 20   Ht 5\' 6"  (1.676 m)   SpO2 98% Comment: RA  BMI 25.82 kg/m   General:    well-appearing  HEENT:  Unremarkable   Neck:   no JVD, no bruits, no adenopathy   Chest:   clear to auscultation, symmetrical breath sounds, no wheezes, no rhonchi   CV:   RRR, no detectable murmur   Abdomen:  soft, non-tender, no masses   Extremities:  warm, well-perfused, pulses intact throughout, no LE edema  Rectal/GU  Deferred  Neuro:   Grossly non-focal and symmetrical throughout  Skin:   Clean and dry, no rashes, no breakdown   Diagnostic Tests:  I have personally reviewed his available imaging studies including echocardiogram and left heart catheterization from last week and agree with their interpretation  Impression:  55 year old man otherwise healthy with symptomatic multivessel coronary artery disease.  Agree that CABG is the best suggestion for treatment.  He is a good candidate but we will complete the work-up and plan for surgery in the near future   Plan:  Vascular ultrasound studies this week Remainder of standard cardiac work-up Planned surgery on 01/05/2021   I spent in excess of 30 minutes during the conduct of this office consultation and >50% of this time involved direct face-to-face encounter with the patient for counseling and/or coordination of their care.          Level 3 Office Consult = 40 minutes         Level 4 Office Consult = 60 minutes         Level 5 Office Consult =  80 minutes  B.  01/07/2021, MD 01/02/2021 1:23 PM

## 2021-01-03 ENCOUNTER — Other Ambulatory Visit (HOSPITAL_COMMUNITY): Payer: Federal, State, Local not specified - PPO

## 2021-01-03 ENCOUNTER — Other Ambulatory Visit (HOSPITAL_COMMUNITY)
Admission: RE | Admit: 2021-01-03 | Discharge: 2021-01-03 | Disposition: A | Discharge status: Home / Self Care | Payer: Federal, State, Local not specified - PPO | Source: Ambulatory Visit | Attending: Cardiothoracic Surgery | Admitting: Cardiothoracic Surgery

## 2021-01-03 ENCOUNTER — Ambulatory Visit (HOSPITAL_COMMUNITY)
Admission: RE | Admit: 2021-01-03 | Discharge: 2021-01-03 | Disposition: A | Discharge status: Home / Self Care | Payer: Federal, State, Local not specified - PPO | Source: Ambulatory Visit | Attending: Cardiothoracic Surgery | Admitting: Cardiothoracic Surgery

## 2021-01-03 ENCOUNTER — Encounter (HOSPITAL_COMMUNITY): Payer: Self-pay

## 2021-01-03 ENCOUNTER — Ambulatory Visit (HOSPITAL_COMMUNITY): Payer: Federal, State, Local not specified - PPO

## 2021-01-03 ENCOUNTER — Encounter (HOSPITAL_COMMUNITY)
Admission: RE | Admit: 2021-01-03 | Discharge: 2021-01-03 | Disposition: A | Discharge status: Home / Self Care | Payer: Federal, State, Local not specified - PPO | Source: Ambulatory Visit | Attending: Cardiothoracic Surgery | Admitting: Cardiothoracic Surgery

## 2021-01-03 DIAGNOSIS — Z01818 Encounter for other preprocedural examination: Secondary | ICD-10-CM

## 2021-01-03 DIAGNOSIS — Z01812 Encounter for preprocedural laboratory examination: Secondary | ICD-10-CM | POA: Insufficient documentation

## 2021-01-03 DIAGNOSIS — I129 Hypertensive chronic kidney disease with stage 1 through stage 4 chronic kidney disease, or unspecified chronic kidney disease: Secondary | ICD-10-CM | POA: Diagnosis not present

## 2021-01-03 DIAGNOSIS — Z7982 Long term (current) use of aspirin: Secondary | ICD-10-CM | POA: Diagnosis not present

## 2021-01-03 DIAGNOSIS — I25119 Atherosclerotic heart disease of native coronary artery with unspecified angina pectoris: Secondary | ICD-10-CM | POA: Insufficient documentation

## 2021-01-03 DIAGNOSIS — E7800 Pure hypercholesterolemia, unspecified: Secondary | ICD-10-CM | POA: Diagnosis not present

## 2021-01-03 DIAGNOSIS — I209 Angina pectoris, unspecified: Secondary | ICD-10-CM | POA: Diagnosis not present

## 2021-01-03 DIAGNOSIS — Z20822 Contact with and (suspected) exposure to covid-19: Secondary | ICD-10-CM | POA: Insufficient documentation

## 2021-01-03 DIAGNOSIS — D62 Acute posthemorrhagic anemia: Secondary | ICD-10-CM | POA: Diagnosis not present

## 2021-01-03 DIAGNOSIS — Z79899 Other long term (current) drug therapy: Secondary | ICD-10-CM | POA: Diagnosis not present

## 2021-01-03 DIAGNOSIS — N189 Chronic kidney disease, unspecified: Secondary | ICD-10-CM | POA: Diagnosis not present

## 2021-01-03 DIAGNOSIS — I25110 Atherosclerotic heart disease of native coronary artery with unstable angina pectoris: Secondary | ICD-10-CM | POA: Diagnosis not present

## 2021-01-03 DIAGNOSIS — I2510 Atherosclerotic heart disease of native coronary artery without angina pectoris: Secondary | ICD-10-CM | POA: Diagnosis not present

## 2021-01-03 DIAGNOSIS — E875 Hyperkalemia: Secondary | ICD-10-CM | POA: Diagnosis not present

## 2021-01-03 DIAGNOSIS — E785 Hyperlipidemia, unspecified: Secondary | ICD-10-CM | POA: Diagnosis not present

## 2021-01-03 DIAGNOSIS — J9811 Atelectasis: Secondary | ICD-10-CM | POA: Diagnosis not present

## 2021-01-03 HISTORY — DX: Gout, unspecified: M10.9

## 2021-01-03 HISTORY — DX: Atherosclerotic heart disease of native coronary artery without angina pectoris: I25.10

## 2021-01-03 LAB — COMPREHENSIVE METABOLIC PANEL
ALT: 13 U/L (ref 0–44)
AST: 18 U/L (ref 15–41)
Albumin: 1.8 g/dL — ABNORMAL LOW (ref 3.5–5.0)
Alkaline Phosphatase: 45 U/L (ref 38–126)
Anion gap: 7 (ref 5–15)
BUN: 38 mg/dL — ABNORMAL HIGH (ref 6–20)
CO2: 18 mmol/L — ABNORMAL LOW (ref 22–32)
Calcium: 8.8 mg/dL — ABNORMAL LOW (ref 8.9–10.3)
Chloride: 108 mmol/L (ref 98–111)
Creatinine, Ser: 1.37 mg/dL — ABNORMAL HIGH (ref 0.61–1.24)
GFR, Estimated: >60 mL/min (ref >60)
Glucose, Bld: 104 mg/dL — ABNORMAL HIGH (ref 70–99)
Potassium: 4.6 mmol/L (ref 3.5–5.1)
Sodium: 133 mmol/L — ABNORMAL LOW (ref 135–145)
Total Bilirubin: 0.7 mg/dL (ref 0.3–1.2)
Total Protein: 4.6 g/dL — ABNORMAL LOW (ref 6.5–8.1)

## 2021-01-03 LAB — BLOOD GAS, ARTERIAL
Acid-base deficit: 2.3 mmol/L — ABNORMAL HIGH (ref 0.0–2.0)
Bicarbonate: 21.8 mmol/L (ref 20.0–28.0)
Drawn by: 60286
FIO2: 21
O2 Saturation: 97.9 %
Patient temperature: 37
pCO2 arterial: 36.3 mmHg (ref 32.0–48.0)
pH, Arterial: 7.396 (ref 7.350–7.450)
pO2, Arterial: 106 mmHg (ref 83.0–108.0)

## 2021-01-03 LAB — APTT: aPTT: 26 seconds (ref 24–36)

## 2021-01-03 LAB — URINALYSIS, ROUTINE W REFLEX MICROSCOPIC
Bilirubin Urine: NEGATIVE
Glucose, UA: NEGATIVE mg/dL
Ketones, ur: NEGATIVE mg/dL
Leukocytes,Ua: NEGATIVE
Nitrite: NEGATIVE
Protein, ur: >=300 mg/dL — AB
Specific Gravity, Urine: 1.015 (ref 1.005–1.030)
pH: 5 (ref 5.0–8.0)

## 2021-01-03 LAB — HEMOGLOBIN A1C
Hgb A1c MFr Bld: 5.4 % (ref 4.8–5.6)
Mean Plasma Glucose: 108.28 mg/dL

## 2021-01-03 LAB — CBC
HCT: 41.6 % (ref 39.0–52.0)
Hemoglobin: 14.6 g/dL (ref 13.0–17.0)
MCH: 31.1 pg (ref 26.0–34.0)
MCHC: 35.1 g/dL (ref 30.0–36.0)
MCV: 88.5 fL (ref 80.0–100.0)
Platelets: 233 10*3/uL (ref 150–400)
RBC: 4.7 MIL/uL (ref 4.22–5.81)
RDW: 11.9 % (ref 11.5–15.5)
WBC: 8.3 10*3/uL (ref 4.0–10.5)
nRBC: 0 % (ref 0.0–0.2)

## 2021-01-03 LAB — SURGICAL PCR SCREEN
MRSA, PCR: NEGATIVE
Staphylococcus aureus: POSITIVE — AB

## 2021-01-03 LAB — PROTIME-INR
INR: 0.9 (ref 0.8–1.2)
Prothrombin Time: 12.1 seconds (ref 11.4–15.2)

## 2021-01-03 LAB — SARS CORONAVIRUS 2 (TAT 6-24 HRS): SARS Coronavirus 2: NEGATIVE

## 2021-01-03 NOTE — Progress Notes (Signed)
PCP - Dr. Lowell Guitar  Cardiologist - Dr. Elease Hashimoto  Chest x-ray - 01/03/21  EKG - 12/29/20 (E)  Stress Test - Denies  ECHO - 12/19/20 (E)  Cardiac Cath - 12/29/20 (E)  AICD-na PM-na LOOP-na  Sleep Study - Denies CPAP - Denies  LABS- 01/03/21: ABG, CBC, CMP, PT, PTT, T/S, UA, PCR, COVID  ASA- LD- 2/22  ERAS- No  HA1C- 01/03/21  Anesthesia- Yes- cardiac history  Pt denies having chest pain, sob, or fever at this time. All instructions explained to the pt, with a verbal understanding of the material. Pt agrees to go over the instructions while at home for a better understanding. Pt also instructed to self quarantine after being tested for COVID-19. The opportunity to ask questions was provided.   Coronavirus Screening  Have you experienced the following symptoms:  Cough yes/no: No Fever (>100.78F)  yes/no: No Runny nose yes/no: No Sore throat yes/no: No Difficulty breathing/shortness of breath  yes/no: No  Have you or a family member traveled in the last 14 days and where? yes/no: No   If the patient indicates "YES" to the above questions, their PAT will be rescheduled to limit the exposure to others and, the surgeon will be notified. THE PATIENT WILL NEED TO BE ASYMPTOMATIC FOR 14 DAYS.   If the patient is not experiencing any of these symptoms, the PAT nurse will instruct them to NOT bring anyone with them to their appointment since they may have these symptoms or traveled as well.   Please remind your patients and families that hospital visitation restrictions are in effect and the importance of the restrictions.

## 2021-01-04 ENCOUNTER — Encounter (HOSPITAL_COMMUNITY): Payer: Self-pay | Admitting: Cardiothoracic Surgery

## 2021-01-04 MED ORDER — MAGNESIUM SULFATE 50 % IJ SOLN
40.0000 meq | INTRAMUSCULAR | Status: DC
  Filled 2021-01-04: qty 9.85

## 2021-01-04 MED ORDER — TRANEXAMIC ACID (OHS) BOLUS VIA INFUSION
15.0000 mg/kg | INTRAVENOUS | Status: AC
  Administered 2021-01-05: 1110 mg via INTRAVENOUS
  Filled 2021-01-04: qty 1110

## 2021-01-04 MED ORDER — PHENYLEPHRINE HCL-NACL 20-0.9 MG/250ML-% IV SOLN
30.0000 ug/min | INTRAVENOUS | Status: AC
  Administered 2021-01-05: 25 ug/min via INTRAVENOUS
  Filled 2021-01-04: qty 250

## 2021-01-04 MED ORDER — POTASSIUM CHLORIDE 2 MEQ/ML IV SOLN
80.0000 meq | INTRAVENOUS | Status: DC
  Filled 2021-01-04: qty 40

## 2021-01-04 MED ORDER — NOREPINEPHRINE 4 MG/250ML-% IV SOLN
0.0000 ug/min | INTRAVENOUS | Status: DC
  Filled 2021-01-04: qty 250

## 2021-01-04 MED ORDER — EPINEPHRINE HCL 5 MG/250ML IV SOLN IN NS
0.0000 ug/min | INTRAVENOUS | Status: DC
  Filled 2021-01-04: qty 250

## 2021-01-04 MED ORDER — VANCOMYCIN HCL 1250 MG/250ML IV SOLN
1250.0000 mg | INTRAVENOUS | Status: AC
  Administered 2021-01-05: 1250 mg via INTRAVENOUS
  Filled 2021-01-04: qty 250

## 2021-01-04 MED ORDER — SODIUM CHLORIDE 0.9 % IV SOLN
1.5000 g | INTRAVENOUS | Status: AC
  Administered 2021-01-05: 1.5 g via INTRAVENOUS
  Filled 2021-01-04: qty 1.5

## 2021-01-04 MED ORDER — TRANEXAMIC ACID 1000 MG/10ML IV SOLN
1.5000 mg/kg/h | INTRAVENOUS | Status: AC
  Administered 2021-01-05: 1.5 mg/kg/h via INTRAVENOUS
  Filled 2021-01-04: qty 25

## 2021-01-04 MED ORDER — INSULIN REGULAR(HUMAN) IN NACL 100-0.9 UT/100ML-% IV SOLN
INTRAVENOUS | Status: AC
  Administered 2021-01-05: .5 [IU]/h via INTRAVENOUS
  Filled 2021-01-04: qty 100

## 2021-01-04 MED ORDER — SODIUM CHLORIDE 0.9 % IV SOLN
750.0000 mg | INTRAVENOUS | Status: AC
  Administered 2021-01-05: 750 mg via INTRAVENOUS
  Filled 2021-01-04: qty 750

## 2021-01-04 MED ORDER — NITROGLYCERIN IN D5W 200-5 MCG/ML-% IV SOLN
2.0000 ug/min | INTRAVENOUS | Status: AC
  Administered 2021-01-05: 16.6 ug/min via INTRAVENOUS
  Filled 2021-01-04: qty 250

## 2021-01-04 MED ORDER — MILRINONE LACTATE IN DEXTROSE 20-5 MG/100ML-% IV SOLN
0.3000 ug/kg/min | INTRAVENOUS | Status: DC
  Filled 2021-01-04: qty 100

## 2021-01-04 MED ORDER — DEXMEDETOMIDINE HCL IN NACL 400 MCG/100ML IV SOLN
0.1000 ug/kg/h | INTRAVENOUS | Status: AC
  Administered 2021-01-05: .5 ug/kg/h via INTRAVENOUS
  Filled 2021-01-04: qty 100

## 2021-01-04 MED ORDER — TRANEXAMIC ACID (OHS) PUMP PRIME SOLUTION
2.0000 mg/kg | INTRAVENOUS | Status: DC
  Filled 2021-01-04: qty 1.48

## 2021-01-04 MED ORDER — SODIUM CHLORIDE 0.9 % IV SOLN
INTRAVENOUS | Status: DC
  Filled 2021-01-04: qty 30

## 2021-01-04 MED ORDER — PLASMA-LYTE 148 IV SOLN
INTRAVENOUS | Status: DC
  Filled 2021-01-04: qty 2.5

## 2021-01-04 MED FILL — ALLOPURINOL 100 MG TABS: 100 | 30 days supply | Qty: 30 | Fill #1

## 2021-01-04 NOTE — Anesthesia Preprocedure Evaluation (Addendum)
Anesthesia Evaluation  Patient identified by MRN, date of birth, ID band Patient awake    Reviewed: Allergy & Precautions, H&P , NPO status , Patient's Chart, lab work & pertinent test results, reviewed documented beta blocker date and time   Airway Mallampati: III  TM Distance: >3 FB Neck ROM: Full    Dental no notable dental hx. (+) Teeth Intact, Dental Advisory Given   Pulmonary neg pulmonary ROS,    Pulmonary exam normal breath sounds clear to auscultation       Cardiovascular Exercise Tolerance: Good hypertension, Pt. on medications and Pt. on home beta blockers + angina + CAD   Rhythm:Regular Rate:Normal     Neuro/Psych negative neurological ROS  negative psych ROS   GI/Hepatic negative GI ROS, Neg liver ROS,   Endo/Other  negative endocrine ROS  Renal/GU negative Renal ROS  negative genitourinary   Musculoskeletal   Abdominal   Peds  Hematology negative hematology ROS (+)   Anesthesia Other Findings   Reproductive/Obstetrics negative OB ROS                           Anesthesia Physical Anesthesia Plan  ASA: IV  Anesthesia Plan: General   Post-op Pain Management:    Induction: Intravenous  PONV Risk Score and Plan: 2 and Midazolam and Treatment may vary due to age or medical condition  Airway Management Planned: Oral ETT  Additional Equipment: Arterial line, CVP, PA Cath, TEE and Ultrasound Guidance Line Placement  Intra-op Plan:   Post-operative Plan: Post-operative intubation/ventilation  Informed Consent: I have reviewed the patients History and Physical, chart, labs and discussed the procedure including the risks, benefits and alternatives for the proposed anesthesia with the patient or authorized representative who has indicated his/her understanding and acceptance.     Dental advisory given  Plan Discussed with: CRNA  Anesthesia Plan Comments: (PAT note  written 01/04/2021 by Shonna Chock, PA-C. )       Anesthesia Quick Evaluation

## 2021-01-04 NOTE — Progress Notes (Signed)
Anesthesia Chart Review:  Case: [id number] Date/Time: 01/05/21 0715   Procedures:      CORONARY ARTERY BYPASS GRAFTING (CABG) (N/A Chest)     TRANSESOPHAGEAL ECHOCARDIOGRAM (TEE) (N/A )     INDOCYANINE GREEN FLUORESCENCE IMAGING (ICG) (N/A )   Anesthesia type: General   Pre-op diagnosis: CAD   [facility] / [facility]   Surgeons: [name], MD      DISCUSSION: Patient is a 55 year old male scheduled for the above procedure.  History includes never smoker, CAD, HTN, hypercholesterolemia, gout,gallstone pancreatitis (s/p cholecystectomy 12/06/09). Reported history of "water in lungs" in 1986 and has history of thoracentesis.  Cardiology notes indicate his wife [name] is a nurse on [employer].  01/03/21 COVID-19 test negative. Anesthesia team to evaluate on the day of surgery. 01/03/21 CXR report is still pending.   VS: BP 123/79   Pulse 64   Temp 36.7 C (Oral)   Resp 18   Ht 5\' 6"  (1.676 m)   Wt 74 kg   SpO2 98%   BMI 26.34 kg/m    PROVIDERS: , DO is PCP  Juliann Pares, MD is cardiologist   LABS: Labs reviewed: Acceptable for surgery. (all labs ordered are listed, but only abnormal results are displayed)  Labs Reviewed  SURGICAL PCR SCREEN - Abnormal; Notable for the following components:      Result Value   Staphylococcus aureus POSITIVE (*)    All other components within normal limits  COMPREHENSIVE METABOLIC PANEL - Abnormal; Notable for the following components:   Sodium 133 (*)    CO2 18 (*)    Glucose, Bld 104 (*)    BUN 38 (*)    Creatinine, Ser 1.37 (*)    Calcium 8.8 (*)    Total Protein 4.6 (*)    Albumin 1.8 (*)    All other components within normal limits  URINALYSIS, ROUTINE W REFLEX MICROSCOPIC - Abnormal; Notable for the following components:   APPearance HAZY (*)    Hgb urine dipstick MODERATE (*)    Protein, ur >=300 (*)    Bacteria, UA RARE (*)    All other components within normal limits  BLOOD GAS, ARTERIAL - Abnormal;  Notable for the following components:   Acid-base deficit 2.3 (*)    Allens test (pass/fail) BRACHIAL ARTERY (*)    All other components within normal limits  CBC  PROTIME-INR  APTT  HEMOGLOBIN A1C  TYPE AND SCREEN     IMAGES: CXR 01/03/21: In process.   Chest CT 12/23/20: IMPRESSION: No acute abnormality involving the extracardiac structures. Aortic Atherosclerosis (ICD10-I70.0).   EKG: 12/29/20: NSR   CV: Cardiac cath 12/29/20: 1. Significant three-vessel coronary artery disease, including sequential 40%-60% mid/distal LAD stenosis as well as 90% LAD lesion near the apex, multifocal proximal-mid LCx disease of up to 90% with significant calcification and tortuosity, 90% proximal and 60% mid RCA lesions, and chronic total occlusion of RCA continuation with left-to-right collaterals filling the distal rPL branches. 2. Normal left ventricular systolic function and filling pressure. Recommendations: 1. Given multivessel disease and complex nature of LCx and RCA lesion, recommend cardiac surgery consultation for CABG. 2. Start metoprolol tartrate 25 mg twice daily for antianginal therapy. 3. Aggressive secondary prevention.   Echo 12/29/20: IMPRESSIONS  1. Left ventricular ejection fraction, by estimation, is 55 to 60%. The  left ventricle has normal function. The left ventricle has no regional  wall motion abnormalities. Left ventricular diastolic parameters were  normal.  2. Right ventricular systolic function is normal. The right ventricular  size is normal. There is normal pulmonary artery systolic pressure.  3. The mitral valve is normal in structure. Trivial mitral valve  regurgitation. No evidence of mitral stenosis.  4. The aortic valve has an indeterminant number of cusps. Aortic valve  regurgitation is not visualized. No aortic stenosis is present.  5. The inferior vena cava is normal in size with greater than 50%  respiratory variability, suggesting right atrial  pressure of 3 mmHg.    Carotid US 12/29/20: Summary:  - Right Carotid: Velocities in the right ICA are consistent with a 1-39% stenosis.  - Left Carotid: Velocities in the left ICA are consistent with a 1-39% stenosis.  - Vertebrals: Bilateral vertebral arteries demonstrate antegrade flow.  - Subclavians: Normal flow hemodynamics were seen in bilateral subclavian arteries.    Past Medical History:  Diagnosis Date  . Coronary artery disease   . Gout   . Hypercholesteremia   . Hypertension   . Injury of both lungs 1986   water in lungs    Past Surgical History:  Procedure Laterality Date  . CARDIAC CATHETERIZATION    . CHOLECYSTECTOMY    . LEFT HEART CATH AND CORONARY ANGIOGRAPHY N/A 12/29/2020   Procedure: LEFT HEART CATH AND CORONARY ANGIOGRAPHY;  Surgeon: Yvonne Kendall, MD;  [facility];  Service: Cardiovascular;  Laterality: N/A;  . THORACENTESIS      MEDICATIONS: . allopurinol (ZYLOPRIM) 100 MG tablet  . aspirin EC 81 MG tablet  . cholecalciferol (VITAMIN D3) 25 MCG (1000 UNIT) tablet  . hydrochlorothiazide (HYDRODIURIL) 25 MG tablet  . ibuprofen (ADVIL,MOTRIN) 200 MG tablet  . lisinopril (ZESTRIL) 10 MG tablet  . loratadine (CLARITIN) 10 MG tablet  . metoprolol tartrate (LOPRESSOR) 25 MG tablet  . Multiple Vitamins-Minerals (MULTIVITAMIN WITH MINERALS) tablet  . nitroGLYCERIN (NITROSTAT) 0.4 MG SL tablet  . rosuvastatin (CRESTOR) 10 MG tablet  . vitamin C (ASCORBIC ACID) 500 MG tablet  . zinc gluconate 50 MG tablet   No current facility-administered medications for this encounter.   Melene Muller ON 01/05/2021] cefUROXime (ZINACEF) 1.5 g in sodium chloride 0.9 % 100 mL IVPB  . [START ON 01/05/2021] cefUROXime (ZINACEF) 750 mg in sodium chloride 0.9 % 100 mL IVPB  . [START ON 01/05/2021] dexmedetomidine (PRECEDEX) 400 MCG/100ML (4 mcg/mL) infusion  . [START ON 01/05/2021] EPINEPHrine (ADRENALIN) 4 mg in NS 250 mL (0.016 mg/mL) premix infusion  . [START  ON 01/05/2021] heparin 30,000 units/NS 1000 mL solution for CELLSAVER  . [START ON 01/05/2021] heparin sodium (porcine) 2,500 Units, papaverine 30 mg in electrolyte-148 (PLASMALYTE-148) 500 mL irrigation  . [START ON 01/05/2021] insulin regular, human (MYXREDLIN) 100 units/ 100 mL infusion  . [START ON 01/05/2021] magnesium sulfate (IV Push/IM) injection 40 mEq  . [START ON 01/05/2021] milrinone (PRIMACOR) 20 MG/100 ML (0.2 mg/mL) infusion  . [START ON 01/05/2021] nitroGLYCERIN 50 mg in dextrose 5 % 250 mL (0.2 mg/mL) infusion  . [START ON 01/05/2021] norepinephrine (LEVOPHED) 4mg  in premix infusion  . [START ON 01/05/2021] phenylephrine (NEOSYNEPHRINE) 20-0.9 MG/250ML-% infusion  . [START ON 01/05/2021] potassium chloride injection 80 mEq  . [START ON 01/05/2021] tranexamic acid (CYKLOKAPRON) 2,500 mg in sodium chloride 0.9 % 250 mL (10 mg/mL) infusion  . [START ON 01/05/2021] tranexamic acid (CYKLOKAPRON) bolus via infusion - over 30 minutes 1,110 mg  . [START ON 01/05/2021] tranexamic acid (CYKLOKAPRON) pump prime solution 148 mg  . [START ON 01/05/2021] vancomycin (VANCOREADY)  IVPB 1250 mg/250 mL    Shonna Chock, PA-C Surgical Short Stay/Anesthesiology [facility] Phone [phone] [facility] Phone [phone] 01/04/2021 9:39 AM

## 2021-01-05 ENCOUNTER — Inpatient Hospital Stay (HOSPITAL_COMMUNITY): Payer: Federal, State, Local not specified - PPO | Admitting: Certified Registered Nurse Anesthetist

## 2021-01-05 ENCOUNTER — Inpatient Hospital Stay (HOSPITAL_COMMUNITY)
Admission: RE | Admit: 2021-01-05 | Discharge: 2021-01-10 | DRG: 236 | Disposition: A | Discharge status: Home / Self Care | Payer: Federal, State, Local not specified - PPO | Attending: Cardiothoracic Surgery | Admitting: Cardiothoracic Surgery

## 2021-01-05 ENCOUNTER — Inpatient Hospital Stay (HOSPITAL_COMMUNITY)
Admission: RE | Disposition: A | Discharge status: Home / Self Care | Payer: Self-pay | Source: Ambulatory Visit | Attending: Cardiothoracic Surgery

## 2021-01-05 ENCOUNTER — Inpatient Hospital Stay (HOSPITAL_COMMUNITY): Payer: Federal, State, Local not specified - PPO

## 2021-01-05 ENCOUNTER — Inpatient Hospital Stay (HOSPITAL_COMMUNITY): Payer: Federal, State, Local not specified - PPO | Admitting: Vascular Surgery

## 2021-01-05 ENCOUNTER — Other Ambulatory Visit: Payer: Self-pay

## 2021-01-05 ENCOUNTER — Encounter (HOSPITAL_COMMUNITY): Payer: Self-pay | Admitting: Cardiothoracic Surgery

## 2021-01-05 DIAGNOSIS — I25110 Atherosclerotic heart disease of native coronary artery with unstable angina pectoris: Secondary | ICD-10-CM | POA: Diagnosis not present

## 2021-01-05 DIAGNOSIS — Z79899 Other long term (current) drug therapy: Secondary | ICD-10-CM

## 2021-01-05 DIAGNOSIS — Z951 Presence of aortocoronary bypass graft: Secondary | ICD-10-CM | POA: Diagnosis not present

## 2021-01-05 DIAGNOSIS — J9811 Atelectasis: Secondary | ICD-10-CM | POA: Diagnosis not present

## 2021-01-05 DIAGNOSIS — I129 Hypertensive chronic kidney disease with stage 1 through stage 4 chronic kidney disease, or unspecified chronic kidney disease: Secondary | ICD-10-CM | POA: Diagnosis not present

## 2021-01-05 DIAGNOSIS — I308 Other forms of acute pericarditis: Secondary | ICD-10-CM | POA: Diagnosis not present

## 2021-01-05 DIAGNOSIS — I10 Essential (primary) hypertension: Secondary | ICD-10-CM | POA: Diagnosis not present

## 2021-01-05 DIAGNOSIS — E875 Hyperkalemia: Secondary | ICD-10-CM | POA: Diagnosis not present

## 2021-01-05 DIAGNOSIS — Z9889 Other specified postprocedural states: Secondary | ICD-10-CM

## 2021-01-05 DIAGNOSIS — N189 Chronic kidney disease, unspecified: Secondary | ICD-10-CM | POA: Diagnosis present

## 2021-01-05 DIAGNOSIS — I2510 Atherosclerotic heart disease of native coronary artery without angina pectoris: Secondary | ICD-10-CM | POA: Diagnosis not present

## 2021-01-05 DIAGNOSIS — I517 Cardiomegaly: Secondary | ICD-10-CM | POA: Diagnosis not present

## 2021-01-05 DIAGNOSIS — Z20822 Contact with and (suspected) exposure to covid-19: Secondary | ICD-10-CM | POA: Diagnosis not present

## 2021-01-05 DIAGNOSIS — R079 Chest pain, unspecified: Secondary | ICD-10-CM | POA: Diagnosis not present

## 2021-01-05 DIAGNOSIS — E785 Hyperlipidemia, unspecified: Secondary | ICD-10-CM | POA: Diagnosis not present

## 2021-01-05 DIAGNOSIS — I081 Rheumatic disorders of both mitral and tricuspid valves: Secondary | ICD-10-CM | POA: Diagnosis not present

## 2021-01-05 DIAGNOSIS — Z4682 Encounter for fitting and adjustment of non-vascular catheter: Secondary | ICD-10-CM

## 2021-01-05 DIAGNOSIS — R531 Weakness: Secondary | ICD-10-CM | POA: Diagnosis not present

## 2021-01-05 DIAGNOSIS — J90 Pleural effusion, not elsewhere classified: Secondary | ICD-10-CM | POA: Diagnosis not present

## 2021-01-05 DIAGNOSIS — E7800 Pure hypercholesterolemia, unspecified: Secondary | ICD-10-CM | POA: Diagnosis present

## 2021-01-05 DIAGNOSIS — D62 Acute posthemorrhagic anemia: Secondary | ICD-10-CM | POA: Diagnosis not present

## 2021-01-05 DIAGNOSIS — Z7982 Long term (current) use of aspirin: Secondary | ICD-10-CM

## 2021-01-05 DIAGNOSIS — J189 Pneumonia, unspecified organism: Secondary | ICD-10-CM | POA: Diagnosis not present

## 2021-01-05 DIAGNOSIS — I25119 Atherosclerotic heart disease of native coronary artery with unspecified angina pectoris: Secondary | ICD-10-CM

## 2021-01-05 HISTORY — PX: CORONARY ARTERY BYPASS GRAFT: SHX141

## 2021-01-05 HISTORY — PX: RADIAL ARTERY HARVEST: SHX5067

## 2021-01-05 HISTORY — PX: TEE WITHOUT CARDIOVERSION: SHX5443

## 2021-01-05 LAB — POCT I-STAT 7, (LYTES, BLD GAS, ICA,H+H)
Acid-Base Excess: 0 mmol/L (ref 0.0–2.0)
Acid-base deficit: 2 mmol/L (ref 0.0–2.0)
Acid-base deficit: 1 mmol/L (ref 0.0–2.0)
Acid-base deficit: 4 mmol/L — ABNORMAL HIGH (ref 0.0–2.0)
Acid-base deficit: 5 mmol/L — ABNORMAL HIGH (ref 0.0–2.0)
Acid-base deficit: 6 mmol/L — ABNORMAL HIGH (ref 0.0–2.0)
Acid-base deficit: 4 mmol/L — ABNORMAL HIGH (ref 0.0–2.0)
Bicarbonate: 24.2 mmol/L (ref 20.0–28.0)
Bicarbonate: 25.6 mmol/L (ref 20.0–28.0)
Bicarbonate: 23.1 mmol/L (ref 20.0–28.0)
Bicarbonate: 21.2 mmol/L (ref 20.0–28.0)
Bicarbonate: 21 mmol/L (ref 20.0–28.0)
Bicarbonate: 19.7 mmol/L — ABNORMAL LOW (ref 20.0–28.0)
Bicarbonate: 22.2 mmol/L (ref 20.0–28.0)
Calcium, Ion: 1.27 mmol/L (ref 1.15–1.40)
Calcium, Ion: 0.96 mmol/L — ABNORMAL LOW (ref 1.15–1.40)
Calcium, Ion: 1.06 mmol/L — ABNORMAL LOW (ref 1.15–1.40)
Calcium, Ion: 1 mmol/L — ABNORMAL LOW (ref 1.15–1.40)
Calcium, Ion: 1.08 mmol/L — ABNORMAL LOW (ref 1.15–1.40)
Calcium, Ion: 1.08 mmol/L — ABNORMAL LOW (ref 1.15–1.40)
Calcium, Ion: 1.08 mmol/L — ABNORMAL LOW (ref 1.15–1.40)
HCT: 36 % — ABNORMAL LOW (ref 39.0–52.0)
HCT: 25 % — ABNORMAL LOW (ref 39.0–52.0)
HCT: 27 % — ABNORMAL LOW (ref 39.0–52.0)
HCT: 28 % — ABNORMAL LOW (ref 39.0–52.0)
HCT: 26 % — ABNORMAL LOW (ref 39.0–52.0)
HCT: 24 % — ABNORMAL LOW (ref 39.0–52.0)
HCT: 24 % — ABNORMAL LOW (ref 39.0–52.0)
Hemoglobin: 12.2 g/dL — ABNORMAL LOW (ref 13.0–17.0)
Hemoglobin: 8.5 g/dL — ABNORMAL LOW (ref 13.0–17.0)
Hemoglobin: 9.2 g/dL — ABNORMAL LOW (ref 13.0–17.0)
Hemoglobin: 9.5 g/dL — ABNORMAL LOW (ref 13.0–17.0)
Hemoglobin: 8.8 g/dL — ABNORMAL LOW (ref 13.0–17.0)
Hemoglobin: 8.2 g/dL — ABNORMAL LOW (ref 13.0–17.0)
Hemoglobin: 8.2 g/dL — ABNORMAL LOW (ref 13.0–17.0)
O2 Saturation: 100 %
O2 Saturation: 100 %
O2 Saturation: 100 %
O2 Saturation: 100 %
O2 Saturation: 99 %
O2 Saturation: 98 %
O2 Saturation: 94 %
Patient temperature: 35.2
Patient temperature: 36.9
Patient temperature: 37.3
Potassium: 4.5 mmol/L (ref 3.5–5.1)
Potassium: 5.2 mmol/L — ABNORMAL HIGH (ref 3.5–5.1)
Potassium: 6.3 mmol/L (ref 3.5–5.1)
Potassium: 5.7 mmol/L — ABNORMAL HIGH (ref 3.5–5.1)
Potassium: 4.9 mmol/L (ref 3.5–5.1)
Potassium: 5.9 mmol/L — ABNORMAL HIGH (ref 3.5–5.1)
Potassium: 5.1 mmol/L (ref 3.5–5.1)
Sodium: 133 mmol/L — ABNORMAL LOW (ref 135–145)
Sodium: 134 mmol/L — ABNORMAL LOW (ref 135–145)
Sodium: 130 mmol/L — ABNORMAL LOW (ref 135–145)
Sodium: 130 mmol/L — ABNORMAL LOW (ref 135–145)
Sodium: 132 mmol/L — ABNORMAL LOW (ref 135–145)
Sodium: 132 mmol/L — ABNORMAL LOW (ref 135–145)
Sodium: 134 mmol/L — ABNORMAL LOW (ref 135–145)
TCO2: 26 mmol/L (ref 22–32)
TCO2: 27 mmol/L (ref 22–32)
TCO2: 24 mmol/L (ref 22–32)
TCO2: 22 mmol/L (ref 22–32)
TCO2: 22 mmol/L (ref 22–32)
TCO2: 21 mmol/L — ABNORMAL LOW (ref 22–32)
TCO2: 23 mmol/L (ref 22–32)
pCO2 arterial: 46.7 mmHg (ref 32.0–48.0)
pCO2 arterial: 44 mmHg (ref 32.0–48.0)
pCO2 arterial: 36.5 mmHg (ref 32.0–48.0)
pCO2 arterial: 36.4 mmHg (ref 32.0–48.0)
pCO2 arterial: 36.5 mmHg (ref 32.0–48.0)
pCO2 arterial: 38 mmHg (ref 32.0–48.0)
pCO2 arterial: 43 mmHg (ref 32.0–48.0)
pH, Arterial: 7.323 — ABNORMAL LOW (ref 7.350–7.450)
pH, Arterial: 7.372 (ref 7.350–7.450)
pH, Arterial: 7.41 (ref 7.350–7.450)
pH, Arterial: 7.374 (ref 7.350–7.450)
pH, Arterial: 7.359 (ref 7.350–7.450)
pH, Arterial: 7.324 — ABNORMAL LOW (ref 7.350–7.450)
pH, Arterial: 7.323 — ABNORMAL LOW (ref 7.350–7.450)
pO2, Arterial: 473 mmHg — ABNORMAL HIGH (ref 83.0–108.0)
pO2, Arterial: 472 mmHg — ABNORMAL HIGH (ref 83.0–108.0)
pO2, Arterial: 345 mmHg — ABNORMAL HIGH (ref 83.0–108.0)
pO2, Arterial: 274 mmHg — ABNORMAL HIGH (ref 83.0–108.0)
pO2, Arterial: 157 mmHg — ABNORMAL HIGH (ref 83.0–108.0)
pO2, Arterial: 104 mmHg (ref 83.0–108.0)
pO2, Arterial: 79 mmHg — ABNORMAL LOW (ref 83.0–108.0)

## 2021-01-05 LAB — POCT I-STAT EG7
Acid-base deficit: 2 mmol/L (ref 0.0–2.0)
Bicarbonate: 22.9 mmol/L (ref 20.0–28.0)
Calcium, Ion: 1.04 mmol/L — ABNORMAL LOW (ref 1.15–1.40)
HCT: 26 % — ABNORMAL LOW (ref 39.0–52.0)
Hemoglobin: 8.8 g/dL — ABNORMAL LOW (ref 13.0–17.0)
O2 Saturation: 82 %
Potassium: 5.1 mmol/L (ref 3.5–5.1)
Sodium: 134 mmol/L — ABNORMAL LOW (ref 135–145)
TCO2: 24 mmol/L (ref 22–32)
pCO2, Ven: 38.9 mmHg — ABNORMAL LOW (ref 44.0–60.0)
pH, Ven: 7.378 (ref 7.250–7.430)
pO2, Ven: 47 mmHg — ABNORMAL HIGH (ref 32.0–45.0)

## 2021-01-05 LAB — CBC
HCT: 26.6 % — ABNORMAL LOW (ref 39.0–52.0)
HCT: 24.3 % — ABNORMAL LOW (ref 39.0–52.0)
Hemoglobin: 9.8 g/dL — ABNORMAL LOW (ref 13.0–17.0)
Hemoglobin: 8.5 g/dL — ABNORMAL LOW (ref 13.0–17.0)
MCH: 32.5 pg (ref 26.0–34.0)
MCH: 31.6 pg (ref 26.0–34.0)
MCHC: 36.8 g/dL — ABNORMAL HIGH (ref 30.0–36.0)
MCHC: 35 g/dL (ref 30.0–36.0)
MCV: 88.1 fL (ref 80.0–100.0)
MCV: 90.3 fL (ref 80.0–100.0)
Platelets: 119 10*3/uL — ABNORMAL LOW (ref 150–400)
Platelets: 120 10*3/uL — ABNORMAL LOW (ref 150–400)
RBC: 3.02 MIL/uL — ABNORMAL LOW (ref 4.22–5.81)
RBC: 2.69 MIL/uL — ABNORMAL LOW (ref 4.22–5.81)
RDW: 11.9 % (ref 11.5–15.5)
RDW: 12.2 % (ref 11.5–15.5)
WBC: 10.1 10*3/uL (ref 4.0–10.5)
WBC: 10 10*3/uL (ref 4.0–10.5)
nRBC: 0 % (ref 0.0–0.2)
nRBC: 0 % (ref 0.0–0.2)

## 2021-01-05 LAB — POCT I-STAT, CHEM 8
BUN: 38 mg/dL — ABNORMAL HIGH (ref 6–20)
BUN: 38 mg/dL — ABNORMAL HIGH (ref 6–20)
BUN: 36 mg/dL — ABNORMAL HIGH (ref 6–20)
BUN: 39 mg/dL — ABNORMAL HIGH (ref 6–20)
BUN: 39 mg/dL — ABNORMAL HIGH (ref 6–20)
Calcium, Ion: 1.31 mmol/L (ref 1.15–1.40)
Calcium, Ion: 1.22 mmol/L (ref 1.15–1.40)
Calcium, Ion: 1.06 mmol/L — ABNORMAL LOW (ref 1.15–1.40)
Calcium, Ion: 1 mmol/L — ABNORMAL LOW (ref 1.15–1.40)
Calcium, Ion: 1.03 mmol/L — ABNORMAL LOW (ref 1.15–1.40)
Chloride: 101 mmol/L (ref 98–111)
Chloride: 103 mmol/L (ref 98–111)
Chloride: 100 mmol/L (ref 98–111)
Chloride: 98 mmol/L (ref 98–111)
Chloride: 99 mmol/L (ref 98–111)
Creatinine, Ser: 1.5 mg/dL — ABNORMAL HIGH (ref 0.61–1.24)
Creatinine, Ser: 1.5 mg/dL — ABNORMAL HIGH (ref 0.61–1.24)
Creatinine, Ser: 1.4 mg/dL — ABNORMAL HIGH (ref 0.61–1.24)
Creatinine, Ser: 1.3 mg/dL — ABNORMAL HIGH (ref 0.61–1.24)
Creatinine, Ser: 1.4 mg/dL — ABNORMAL HIGH (ref 0.61–1.24)
Glucose, Bld: 99 mg/dL (ref 70–99)
Glucose, Bld: 102 mg/dL — ABNORMAL HIGH (ref 70–99)
Glucose, Bld: 100 mg/dL — ABNORMAL HIGH (ref 70–99)
Glucose, Bld: 100 mg/dL — ABNORMAL HIGH (ref 70–99)
Glucose, Bld: 94 mg/dL (ref 70–99)
HCT: 35 % — ABNORMAL LOW (ref 39.0–52.0)
HCT: 31 % — ABNORMAL LOW (ref 39.0–52.0)
HCT: 25 % — ABNORMAL LOW (ref 39.0–52.0)
HCT: 21 % — ABNORMAL LOW (ref 39.0–52.0)
HCT: 23 % — ABNORMAL LOW (ref 39.0–52.0)
Hemoglobin: 11.9 g/dL — ABNORMAL LOW (ref 13.0–17.0)
Hemoglobin: 10.5 g/dL — ABNORMAL LOW (ref 13.0–17.0)
Hemoglobin: 8.5 g/dL — ABNORMAL LOW (ref 13.0–17.0)
Hemoglobin: 7.1 g/dL — ABNORMAL LOW (ref 13.0–17.0)
Hemoglobin: 7.8 g/dL — ABNORMAL LOW (ref 13.0–17.0)
Potassium: 4.5 mmol/L (ref 3.5–5.1)
Potassium: 5.2 mmol/L — ABNORMAL HIGH (ref 3.5–5.1)
Potassium: 6.4 mmol/L (ref 3.5–5.1)
Potassium: 5.7 mmol/L — ABNORMAL HIGH (ref 3.5–5.1)
Potassium: 6.6 mmol/L (ref 3.5–5.1)
Sodium: 133 mmol/L — ABNORMAL LOW (ref 135–145)
Sodium: 131 mmol/L — ABNORMAL LOW (ref 135–145)
Sodium: 129 mmol/L — ABNORMAL LOW (ref 135–145)
Sodium: 127 mmol/L — ABNORMAL LOW (ref 135–145)
Sodium: 130 mmol/L — ABNORMAL LOW (ref 135–145)
TCO2: 23 mmol/L (ref 22–32)
TCO2: 22 mmol/L (ref 22–32)
TCO2: 23 mmol/L (ref 22–32)
TCO2: 21 mmol/L — ABNORMAL LOW (ref 22–32)
TCO2: 23 mmol/L (ref 22–32)

## 2021-01-05 LAB — BASIC METABOLIC PANEL
Anion gap: 7 (ref 5–15)
BUN: 37 mg/dL — ABNORMAL HIGH (ref 6–20)
CO2: 20 mmol/L — ABNORMAL LOW (ref 22–32)
Calcium: 6.7 mg/dL — ABNORMAL LOW (ref 8.9–10.3)
Chloride: 107 mmol/L (ref 98–111)
Creatinine, Ser: 1.51 mg/dL — ABNORMAL HIGH (ref 0.61–1.24)
GFR, Estimated: 54 mL/min — ABNORMAL LOW (ref >60)
Glucose, Bld: 144 mg/dL — ABNORMAL HIGH (ref 70–99)
Potassium: 4.8 mmol/L (ref 3.5–5.1)
Sodium: 134 mmol/L — ABNORMAL LOW (ref 135–145)

## 2021-01-05 LAB — PROTIME-INR
INR: 1.4 — ABNORMAL HIGH (ref 0.8–1.2)
Prothrombin Time: 16.9 seconds — ABNORMAL HIGH (ref 11.4–15.2)

## 2021-01-05 LAB — ECHO INTRAOPERATIVE TEE
Height: 66 in
Weight: 2560 oz

## 2021-01-05 LAB — APTT: aPTT: 35 seconds (ref 24–36)

## 2021-01-05 LAB — GLUCOSE, CAPILLARY
Glucose-Capillary: 147 mg/dL — ABNORMAL HIGH (ref 70–99)
Glucose-Capillary: 156 mg/dL — ABNORMAL HIGH (ref 70–99)

## 2021-01-05 LAB — ABO/RH: ABO/RH(D): B POS

## 2021-01-05 LAB — HEMOGLOBIN AND HEMATOCRIT, BLOOD
HCT: 26.3 % — ABNORMAL LOW (ref 39.0–52.0)
Hemoglobin: 9.4 g/dL — ABNORMAL LOW (ref 13.0–17.0)

## 2021-01-05 LAB — PLATELET COUNT: Platelets: 161 10*3/uL (ref 150–400)

## 2021-01-05 LAB — MAGNESIUM: Magnesium: 3.3 mg/dL — ABNORMAL HIGH (ref 1.7–2.4)

## 2021-01-05 SURGERY — CORONARY ARTERY BYPASS GRAFTING (CABG)
Anesthesia: General | Site: Chest

## 2021-01-05 MED ORDER — LACTATED RINGERS IV SOLN: INTRAVENOUS | Status: DC

## 2021-01-05 MED ORDER — LORATADINE 10 MG PO TABS: 10.0000 mg | ORAL_TABLET | Freq: Every day | ORAL | Status: DC | PRN

## 2021-01-05 MED ORDER — ONDANSETRON HCL 4 MG/2ML IJ SOLN
4.0000 mg | Freq: Four times a day (QID) | INTRAMUSCULAR | Status: DC | PRN
  Administered 2021-01-05 – 2021-01-09 (×3): 4 mg via INTRAVENOUS
  Filled 2021-01-05 (×4): qty 2

## 2021-01-05 MED ORDER — PROPOFOL 10 MG/ML IV BOLUS
INTRAVENOUS | Status: DC | PRN
  Administered 2021-01-05: 30 mg via INTRAVENOUS
  Administered 2021-01-05: 50 mg via INTRAVENOUS

## 2021-01-05 MED ORDER — HEMOSTATIC AGENTS (NO CHARGE) OPTIME
TOPICAL | Status: DC | PRN
  Administered 2021-01-05: 1 via TOPICAL

## 2021-01-05 MED ORDER — BISACODYL 5 MG PO TBEC
10.0000 mg | DELAYED_RELEASE_TABLET | Freq: Every day | ORAL | Status: DC
  Administered 2021-01-07: 10 mg via ORAL
  Filled 2021-01-05 (×4): qty 2

## 2021-01-05 MED ORDER — SODIUM CHLORIDE 0.9 % IV SOLN: INTRAVENOUS | Status: DC

## 2021-01-05 MED ORDER — METOPROLOL TARTRATE 25 MG/10 ML ORAL SUSPENSION: 12.5000 mg | Freq: Two times a day (BID) | ORAL | Status: DC

## 2021-01-05 MED ORDER — HEPARIN SODIUM (PORCINE) 1000 UNIT/ML IJ SOLN
INTRAMUSCULAR | Status: AC
  Filled 2021-01-05: qty 1

## 2021-01-05 MED ORDER — METOCLOPRAMIDE HCL 5 MG/ML IJ SOLN
10.0000 mg | Freq: Four times a day (QID) | INTRAMUSCULAR | Status: AC
  Administered 2021-01-05 – 2021-01-06 (×4): 10 mg via INTRAVENOUS
  Filled 2021-01-05 (×4): qty 2

## 2021-01-05 MED ORDER — PROTAMINE SULFATE 10 MG/ML IV SOLN
INTRAVENOUS | Status: DC | PRN
  Administered 2021-01-05: 50 mg via INTRAVENOUS
  Administered 2021-01-05: 30 mg via INTRAVENOUS
  Administered 2021-01-05: 50 mg via INTRAVENOUS
  Administered 2021-01-05: 25 mg via INTRAVENOUS
  Administered 2021-01-05: 50 mg via INTRAVENOUS
  Administered 2021-01-05: 80 mg via INTRAVENOUS

## 2021-01-05 MED ORDER — ARTIFICIAL TEARS OPHTHALMIC OINT
TOPICAL_OINTMENT | OPHTHALMIC | Status: AC
  Filled 2021-01-05: qty 3.5

## 2021-01-05 MED ORDER — MORPHINE SULFATE (PF) 2 MG/ML IV SOLN
1.0000 mg | INTRAVENOUS | Status: DC | PRN
  Administered 2021-01-05 – 2021-01-06 (×7): 2 mg via INTRAVENOUS
  Administered 2021-01-07: 4 mg via INTRAVENOUS
  Filled 2021-01-05 (×3): qty 1
  Filled 2021-01-05: qty 2
  Filled 2021-01-05: qty 1
  Filled 2021-01-05: qty 2
  Filled 2021-01-05 (×2): qty 1

## 2021-01-05 MED ORDER — VANCOMYCIN HCL IN DEXTROSE 1-5 GM/200ML-% IV SOLN
1000.0000 mg | Freq: Once | INTRAVENOUS | Status: AC
  Administered 2021-01-05: 1000 mg via INTRAVENOUS
  Filled 2021-01-05: qty 200

## 2021-01-05 MED ORDER — FENTANYL CITRATE (PF) 250 MCG/5ML IJ SOLN
INTRAMUSCULAR | Status: AC
  Filled 2021-01-05: qty 25

## 2021-01-05 MED ORDER — TRAMADOL HCL 50 MG PO TABS
50.0000 mg | ORAL_TABLET | ORAL | Status: DC | PRN
  Administered 2021-01-06 – 2021-01-09 (×5): 100 mg via ORAL
  Filled 2021-01-05 (×5): qty 2

## 2021-01-05 MED ORDER — PHENYLEPHRINE 40 MCG/ML (10ML) SYRINGE FOR IV PUSH (FOR BLOOD PRESSURE SUPPORT)
PREFILLED_SYRINGE | INTRAVENOUS | Status: DC | PRN
  Administered 2021-01-05: 80 ug via INTRAVENOUS
  Administered 2021-01-05: 40 ug via INTRAVENOUS
  Administered 2021-01-05 (×2): 80 ug via INTRAVENOUS

## 2021-01-05 MED ORDER — POTASSIUM CHLORIDE 10 MEQ/50ML IV SOLN: 10.0000 meq | INTRAVENOUS | Status: AC

## 2021-01-05 MED ORDER — DEXMEDETOMIDINE HCL IN NACL 400 MCG/100ML IV SOLN
0.0000 ug/kg/h | INTRAVENOUS | Status: DC
  Administered 2021-01-06: 0.2 ug/kg/h via INTRAVENOUS
  Filled 2021-01-05: qty 100

## 2021-01-05 MED ORDER — CHLORHEXIDINE GLUCONATE 0.12% ORAL RINSE (MEDLINE KIT)
15.0000 mL | Freq: Two times a day (BID) | OROMUCOSAL | Status: DC
  Administered 2021-01-05 – 2021-01-09 (×7): 15 mL via OROMUCOSAL

## 2021-01-05 MED ORDER — ASPIRIN EC 325 MG PO TBEC
325.0000 mg | DELAYED_RELEASE_TABLET | Freq: Every day | ORAL | Status: DC
  Administered 2021-01-06 – 2021-01-10 (×5): 325 mg via ORAL
  Filled 2021-01-05 (×5): qty 1

## 2021-01-05 MED ORDER — SODIUM CHLORIDE 0.9% FLUSH
10.0000 mL | Freq: Two times a day (BID) | INTRAVENOUS | Status: DC
  Administered 2021-01-05 – 2021-01-08 (×5): 10 mL

## 2021-01-05 MED ORDER — BISACODYL 10 MG RE SUPP: 10.0000 mg | Freq: Every day | RECTAL | Status: DC

## 2021-01-05 MED ORDER — ASPIRIN 81 MG PO CHEW: 324.0000 mg | CHEWABLE_TABLET | Freq: Every day | ORAL | Status: DC

## 2021-01-05 MED ORDER — SODIUM BICARBONATE 8.4 % IV SOLN: 50.0000 meq | Freq: Once | INTRAVENOUS | Status: AC

## 2021-01-05 MED ORDER — SODIUM CHLORIDE 0.9% IV SOLUTION: Freq: Once | INTRAVENOUS | Status: DC

## 2021-01-05 MED ORDER — SODIUM CHLORIDE 0.9% FLUSH
3.0000 mL | Freq: Two times a day (BID) | INTRAVENOUS | Status: DC
  Administered 2021-01-06 – 2021-01-07 (×2): 3 mL via INTRAVENOUS

## 2021-01-05 MED ORDER — BUPIVACAINE LIPOSOME 1.3 % IJ SUSP
20.0000 mL | Freq: Once | INTRAMUSCULAR | Status: DC
  Filled 2021-01-05: qty 20

## 2021-01-05 MED ORDER — MIDAZOLAM HCL 5 MG/5ML IJ SOLN
INTRAMUSCULAR | Status: DC | PRN
  Administered 2021-01-05: 3 mg via INTRAVENOUS
  Administered 2021-01-05: 2 mg via INTRAVENOUS
  Administered 2021-01-05: 3 mg via INTRAVENOUS
  Administered 2021-01-05: 2 mg via INTRAVENOUS

## 2021-01-05 MED ORDER — MIDAZOLAM HCL 2 MG/2ML IJ SOLN: 2.0000 mg | INTRAMUSCULAR | Status: DC | PRN

## 2021-01-05 MED ORDER — CALCIUM CHLORIDE 10 % IV SOLN: INTRAVENOUS | Status: DC | PRN

## 2021-01-05 MED ORDER — STERILE WATER FOR INJECTION IJ SOLN
INTRAMUSCULAR | Status: AC
  Filled 2021-01-05: qty 10

## 2021-01-05 MED ORDER — SODIUM CHLORIDE 0.9 % IV SOLN
1.5000 g | Freq: Two times a day (BID) | INTRAVENOUS | Status: AC
  Administered 2021-01-05 – 2021-01-07 (×4): 1.5 g via INTRAVENOUS
  Filled 2021-01-05 (×5): qty 1.5

## 2021-01-05 MED ORDER — METOPROLOL TARTRATE 12.5 MG HALF TABLET
12.5000 mg | ORAL_TABLET | Freq: Once | ORAL | Status: DC
  Filled 2021-01-05: qty 1

## 2021-01-05 MED ORDER — PROTAMINE SULFATE 10 MG/ML IV SOLN
INTRAVENOUS | Status: AC
  Filled 2021-01-05: qty 25

## 2021-01-05 MED ORDER — SODIUM CHLORIDE 0.9 % IV SOLN: INTRAVENOUS | Status: DC | PRN

## 2021-01-05 MED ORDER — ACETAMINOPHEN 500 MG PO TABS
1000.0000 mg | ORAL_TABLET | Freq: Once | ORAL | Status: AC
  Administered 2021-01-05: 1000 mg via ORAL
  Filled 2021-01-05: qty 2

## 2021-01-05 MED ORDER — LACTATED RINGERS IV SOLN: INTRAVENOUS | Status: DC | PRN

## 2021-01-05 MED ORDER — CHLORHEXIDINE GLUCONATE 4 % EX LIQD: 30.0000 mL | CUTANEOUS | Status: DC

## 2021-01-05 MED ORDER — ALBUMIN HUMAN 5 % IV SOLN: INTRAVENOUS | Status: DC | PRN

## 2021-01-05 MED ORDER — VANCOMYCIN HCL 1000 MG IV SOLR
INTRAVENOUS | Status: DC | PRN
  Administered 2021-01-05: 3000 mg

## 2021-01-05 MED ORDER — DOCUSATE SODIUM 100 MG PO CAPS
200.0000 mg | ORAL_CAPSULE | Freq: Every day | ORAL | Status: DC
  Administered 2021-01-06 – 2021-01-10 (×5): 200 mg via ORAL
  Filled 2021-01-05 (×5): qty 2

## 2021-01-05 MED ORDER — 0.9 % SODIUM CHLORIDE (POUR BTL) OPTIME
TOPICAL | Status: DC | PRN
  Administered 2021-01-05: 5000 mL

## 2021-01-05 MED ORDER — PHENYLEPHRINE HCL-NACL 20-0.9 MG/250ML-% IV SOLN: 0.0000 ug/min | INTRAVENOUS | Status: DC

## 2021-01-05 MED ORDER — ROCURONIUM BROMIDE 10 MG/ML (PF) SYRINGE
PREFILLED_SYRINGE | INTRAVENOUS | Status: DC | PRN
  Administered 2021-01-05 (×2): 50 mg via INTRAVENOUS
  Administered 2021-01-05: 100 mg via INTRAVENOUS

## 2021-01-05 MED ORDER — PLASMA-LYTE 148 IV SOLN
INTRAVENOUS | Status: DC | PRN
  Administered 2021-01-05: 500 mL via INTRAVASCULAR

## 2021-01-05 MED ORDER — SODIUM CHLORIDE 0.9 % IV SOLN: 250.0000 mL | INTRAVENOUS | Status: DC

## 2021-01-05 MED ORDER — LACTATED RINGERS IV SOLN: 500.0000 mL | Freq: Once | INTRAVENOUS | Status: DC | PRN

## 2021-01-05 MED ORDER — LIDOCAINE 2% (20 MG/ML) 5 ML SYRINGE
INTRAMUSCULAR | Status: AC
  Filled 2021-01-05: qty 5

## 2021-01-05 MED ORDER — CHLORHEXIDINE GLUCONATE 0.12 % MT SOLN
15.0000 mL | Freq: Once | OROMUCOSAL | Status: AC
  Administered 2021-01-05: 15 mL via OROMUCOSAL

## 2021-01-05 MED ORDER — OXYCODONE HCL 5 MG PO TABS
5.0000 mg | ORAL_TABLET | ORAL | Status: DC | PRN
  Administered 2021-01-06 (×3): 10 mg via ORAL
  Administered 2021-01-06: 5 mg via ORAL
  Administered 2021-01-06 – 2021-01-08 (×5): 10 mg via ORAL
  Administered 2021-01-09: 5 mg via ORAL
  Filled 2021-01-05 (×6): qty 2
  Filled 2021-01-05: qty 1
  Filled 2021-01-05 (×2): qty 2
  Filled 2021-01-05 (×2): qty 1

## 2021-01-05 MED ORDER — BUPIVACAINE HCL (PF) 0.5 % IJ SOLN
INTRAMUSCULAR | Status: AC
  Filled 2021-01-05: qty 30

## 2021-01-05 MED ORDER — PROPOFOL 10 MG/ML IV BOLUS
INTRAVENOUS | Status: AC
  Filled 2021-01-05: qty 20

## 2021-01-05 MED ORDER — ORAL CARE MOUTH RINSE: 15.0000 mL | Freq: Once | OROMUCOSAL | Status: DC

## 2021-01-05 MED ORDER — ARTIFICIAL TEARS OPHTHALMIC OINT
TOPICAL_OINTMENT | OPHTHALMIC | Status: DC | PRN
  Administered 2021-01-05: 1 via OPHTHALMIC

## 2021-01-05 MED ORDER — ROSUVASTATIN CALCIUM 5 MG PO TABS
10.0000 mg | ORAL_TABLET | Freq: Every day | ORAL | Status: DC
  Administered 2021-01-05 – 2021-01-09 (×5): 10 mg via ORAL
  Filled 2021-01-05 (×5): qty 2

## 2021-01-05 MED ORDER — ROCURONIUM BROMIDE 10 MG/ML (PF) SYRINGE
PREFILLED_SYRINGE | INTRAVENOUS | Status: AC
  Filled 2021-01-05: qty 10

## 2021-01-05 MED ORDER — ALBUMIN HUMAN 5 % IV SOLN
250.0000 mL | INTRAVENOUS | Status: AC | PRN
  Administered 2021-01-05 – 2021-01-06 (×3): 12.5 g via INTRAVENOUS
  Filled 2021-01-05: qty 250

## 2021-01-05 MED ORDER — ACETAMINOPHEN 160 MG/5ML PO SOLN: 650.0000 mg | Freq: Once | ORAL | Status: AC

## 2021-01-05 MED ORDER — PHENYLEPHRINE 40 MCG/ML (10ML) SYRINGE FOR IV PUSH (FOR BLOOD PRESSURE SUPPORT)
PREFILLED_SYRINGE | INTRAVENOUS | Status: AC
  Filled 2021-01-05: qty 10

## 2021-01-05 MED ORDER — HEPARIN SODIUM (PORCINE) 1000 UNIT/ML IJ SOLN
INTRAMUSCULAR | Status: DC | PRN
  Administered 2021-01-05: 26000 [IU] via INTRAVENOUS

## 2021-01-05 MED ORDER — DEXTROSE 50 % IV SOLN
0.0000 mL | INTRAVENOUS | Status: DC | PRN
Start: 2021-01-05 — End: 2021-01-05

## 2021-01-05 MED ORDER — THROMBIN 5000 UNITS EX SOLR
INTRAVENOUS | Status: DC | PRN
  Administered 2021-01-05: 2 mL

## 2021-01-05 MED ORDER — SUCCINYLCHOLINE CHLORIDE 200 MG/10ML IV SOSY
PREFILLED_SYRINGE | INTRAVENOUS | Status: AC
  Filled 2021-01-05: qty 10

## 2021-01-05 MED ORDER — ACETAMINOPHEN 650 MG RE SUPP
650.0000 mg | Freq: Once | RECTAL | Status: AC
  Administered 2021-01-05: 650 mg via RECTAL

## 2021-01-05 MED ORDER — ORAL CARE MOUTH RINSE
15.0000 mL | OROMUCOSAL | Status: DC
  Administered 2021-01-05 (×3): 15 mL via OROMUCOSAL

## 2021-01-05 MED ORDER — BUPIVACAINE LIPOSOME 1.3 % IJ SUSP
INTRAMUSCULAR | Status: DC | PRN
  Administered 2021-01-05: 50 mL

## 2021-01-05 MED ORDER — SODIUM CHLORIDE 0.9% FLUSH
3.0000 mL | INTRAVENOUS | Status: DC | PRN
Start: 2021-01-06 — End: 2021-01-08

## 2021-01-05 MED ORDER — PANTOPRAZOLE SODIUM 40 MG PO TBEC
40.0000 mg | DELAYED_RELEASE_TABLET | Freq: Every day | ORAL | Status: DC
  Administered 2021-01-07 – 2021-01-10 (×4): 40 mg via ORAL
  Filled 2021-01-05 (×4): qty 1

## 2021-01-05 MED ORDER — PLATELET RICH PLASMA OPTIME
Status: DC | PRN
  Administered 2021-01-05: 10 mL

## 2021-01-05 MED ORDER — SODIUM CHLORIDE 0.9% FLUSH
10.0000 mL | INTRAVENOUS | Status: DC | PRN
  Administered 2021-01-06: 10 mL

## 2021-01-05 MED ORDER — MIDAZOLAM HCL (PF) 10 MG/2ML IJ SOLN
INTRAMUSCULAR | Status: AC
  Filled 2021-01-05: qty 2

## 2021-01-05 MED ORDER — MAGNESIUM SULFATE 4 GM/100ML IV SOLN
4.0000 g | Freq: Once | INTRAVENOUS | Status: AC
  Administered 2021-01-05: 4 g via INTRAVENOUS
  Filled 2021-01-05: qty 100

## 2021-01-05 MED ORDER — ACETAMINOPHEN 160 MG/5ML PO SOLN
1000.0000 mg | Freq: Four times a day (QID) | ORAL | Status: DC
  Administered 2021-01-07: 1000 mg
  Filled 2021-01-05: qty 40.6

## 2021-01-05 MED ORDER — EPHEDRINE SULFATE-NACL 50-0.9 MG/10ML-% IV SOSY
PREFILLED_SYRINGE | INTRAVENOUS | Status: DC | PRN
  Administered 2021-01-05: 5 mg via INTRAVENOUS

## 2021-01-05 MED ORDER — METOPROLOL TARTRATE 5 MG/5ML IV SOLN
2.5000 mg | INTRAVENOUS | Status: DC | PRN
  Administered 2021-01-10: 2.5 mg via INTRAVENOUS
  Filled 2021-01-05: qty 5

## 2021-01-05 MED ORDER — EPHEDRINE 5 MG/ML INJ
INTRAVENOUS | Status: AC
  Filled 2021-01-05: qty 10

## 2021-01-05 MED ORDER — CHLORHEXIDINE GLUCONATE 0.12 % MT SOLN
15.0000 mL | OROMUCOSAL | Status: AC
  Administered 2021-01-05: 15 mL via OROMUCOSAL

## 2021-01-05 MED ORDER — METOPROLOL TARTRATE 12.5 MG HALF TABLET
12.5000 mg | ORAL_TABLET | Freq: Two times a day (BID) | ORAL | Status: DC
  Filled 2021-01-05: qty 1

## 2021-01-05 MED ORDER — ALLOPURINOL 100 MG PO TABS
100.0000 mg | ORAL_TABLET | Freq: Every day | ORAL | Status: DC
  Administered 2021-01-06 – 2021-01-10 (×5): 100 mg via ORAL
  Filled 2021-01-05 (×5): qty 1

## 2021-01-05 MED ORDER — CHLORHEXIDINE GLUCONATE CLOTH 2 % EX PADS
6.0000 | MEDICATED_PAD | Freq: Every day | CUTANEOUS | Status: DC
  Administered 2021-01-05 – 2021-01-08 (×4): 6 via TOPICAL

## 2021-01-05 MED ORDER — SODIUM BICARBONATE 8.4 % IV SOLN
INTRAVENOUS | Status: AC
  Administered 2021-01-05: 50 meq via INTRAVENOUS
  Filled 2021-01-05: qty 50

## 2021-01-05 MED ORDER — FAMOTIDINE IN NACL 20-0.9 MG/50ML-% IV SOLN
20.0000 mg | Freq: Two times a day (BID) | INTRAVENOUS | Status: AC
  Administered 2021-01-05: 20 mg via INTRAVENOUS
  Filled 2021-01-05: qty 50

## 2021-01-05 MED ORDER — NITROGLYCERIN IN D5W 200-5 MCG/ML-% IV SOLN: 7.0000 ug/min | INTRAVENOUS | Status: DC

## 2021-01-05 MED ORDER — PROTAMINE SULFATE 10 MG/ML IV SOLN
INTRAVENOUS | Status: AC
  Filled 2021-01-05: qty 5

## 2021-01-05 MED ORDER — PLATELET POOR PLASMA OPTIME
Status: DC | PRN
Start: 2021-01-05 — End: 2021-01-05
  Administered 2021-01-05: 10 mL

## 2021-01-05 MED ORDER — FENTANYL CITRATE (PF) 250 MCG/5ML IJ SOLN
INTRAMUSCULAR | Status: DC | PRN
  Administered 2021-01-05 (×3): 100 ug via INTRAVENOUS
  Administered 2021-01-05: 50 ug via INTRAVENOUS
  Administered 2021-01-05: 100 ug via INTRAVENOUS
  Administered 2021-01-05: 150 ug via INTRAVENOUS
  Administered 2021-01-05: 200 ug via INTRAVENOUS
  Administered 2021-01-05: 50 ug via INTRAVENOUS
  Administered 2021-01-05: 250 ug via INTRAVENOUS
  Administered 2021-01-05 (×3): 50 ug via INTRAVENOUS

## 2021-01-05 MED ORDER — STERILE WATER FOR INJECTION IJ SOLN
INTRAMUSCULAR | Status: DC | PRN
  Administered 2021-01-05: 10 mL

## 2021-01-05 MED ORDER — ACETAMINOPHEN 500 MG PO TABS
1000.0000 mg | ORAL_TABLET | Freq: Four times a day (QID) | ORAL | Status: DC
  Administered 2021-01-06 – 2021-01-10 (×16): 1000 mg via ORAL
  Filled 2021-01-05 (×16): qty 2

## 2021-01-05 MED ORDER — VANCOMYCIN HCL 1000 MG IV SOLR
INTRAVENOUS | Status: AC
  Filled 2021-01-05: qty 3000

## 2021-01-05 MED ORDER — CHLORHEXIDINE GLUCONATE 0.12 % MT SOLN
15.0000 mL | Freq: Once | OROMUCOSAL | Status: DC
  Filled 2021-01-05: qty 15

## 2021-01-05 MED ORDER — INSULIN REGULAR(HUMAN) IN NACL 100-0.9 UT/100ML-% IV SOLN: INTRAVENOUS | Status: DC

## 2021-01-05 MED ORDER — INSULIN ASPART 100 UNIT/ML ~~LOC~~ SOLN
0.0000 [IU] | SUBCUTANEOUS | Status: DC
  Administered 2021-01-05 – 2021-01-07 (×9): 2 [IU] via SUBCUTANEOUS

## 2021-01-05 MED ORDER — SODIUM CHLORIDE 0.45 % IV SOLN: INTRAVENOUS | Status: DC | PRN

## 2021-01-05 SURGICAL SUPPLY — 109 items
ADAPTER CARDIO PERF ANTE/RETRO (ADAPTER) ×4 IMPLANT
ADH SKN CLS APL DERMABOND .7 (GAUZE/BANDAGES/DRESSINGS) ×3
ADPR PRFSN 84XANTGRD RTRGD (ADAPTER) ×3
APL SRG 7X2 LUM MLBL SLNT (VASCULAR PRODUCTS) ×3
APPLICATOR TIP COSEAL (VASCULAR PRODUCTS) ×1 IMPLANT
APPLIER CLIP 9.375 SM OPEN (CLIP) ×4
APR CLP SM 9.3 20 MLT OPN (CLIP) ×3
BAG DECANTER FOR FLEXI CONT (MISCELLANEOUS) ×4 IMPLANT
BLADE CLIPPER SURG (BLADE) ×4 IMPLANT
BLADE STERNUM SYSTEM 6 (BLADE) ×4 IMPLANT
BLADE SURG 15 STRL LF DISP TIS (BLADE) ×3 IMPLANT
BLADE SURG 15 STRL SS (BLADE) ×8
BNDG ELASTIC 4X5.8 VLCR STR LF (GAUZE/BANDAGES/DRESSINGS) ×4 IMPLANT
BNDG GAUZE ELAST 4 BULKY (GAUZE/BANDAGES/DRESSINGS) ×4 IMPLANT
CANISTER SUCT 3000ML PPV (MISCELLANEOUS) ×4 IMPLANT
CATH CPB KIT HENDRICKSON (MISCELLANEOUS) ×4 IMPLANT
CATH ROBINSON RED A/P 18FR (CATHETERS) ×8 IMPLANT
CLIP APPLIE 9.375 SM OPEN (CLIP) ×3 IMPLANT
CLIP RETRACTION 3.0MM CORONARY (MISCELLANEOUS) ×4 IMPLANT
CLIP VESOCCLUDE MED 24/CT (CLIP) IMPLANT
CLIP VESOCCLUDE SM WIDE 24/CT (CLIP) ×12 IMPLANT
COVER BACK TABLE 60X90IN (DRAPES) ×1 IMPLANT
COVER MAYO STAND STRL (DRAPES) ×4 IMPLANT
CUFF TOURN SGL QUICK 18X4 (TOURNIQUET CUFF) IMPLANT
CUFF TOURN SGL QUICK 24 (TOURNIQUET CUFF)
CUFF TRNQT CYL 24X4X16.5-23 (TOURNIQUET CUFF) IMPLANT
DERMABOND ADVANCED (GAUZE/BANDAGES/DRESSINGS) ×1
DERMABOND ADVANCED .7 DNX12 (GAUZE/BANDAGES/DRESSINGS) ×3 IMPLANT
DRAIN CHANNEL 28F RND 3/8 FF (WOUND CARE) ×12 IMPLANT
DRAPE CARDIOVASCULAR INCISE (DRAPES) ×4
DRAPE EXTREMITY T 121X128X90 (DISPOSABLE) ×4 IMPLANT
DRAPE HALF SHEET 40X57 (DRAPES) ×4 IMPLANT
DRAPE SLUSH/WARMER DISC (DRAPES) ×4 IMPLANT
DRAPE SPACE STATION 30X60X34 (DRAPES) ×1 IMPLANT
DRAPE SRG 135X102X78XABS (DRAPES) ×3 IMPLANT
DRSG AQUACEL AG ADV 3.5X14 (GAUZE/BANDAGES/DRESSINGS) ×4 IMPLANT
ELECT CAUTERY BLADE 6.4 (BLADE) ×4 IMPLANT
ELECT REM PT RETURN 9FT ADLT (ELECTROSURGICAL) ×8
ELECTRODE REM PT RTRN 9FT ADLT (ELECTROSURGICAL) ×6 IMPLANT
FELT TEFLON 1X6 (MISCELLANEOUS) ×8 IMPLANT
GAUZE SPONGE 4X4 12PLY STRL (GAUZE/BANDAGES/DRESSINGS) ×8 IMPLANT
GEL ULTRASOUND 20GR AQUASONIC (MISCELLANEOUS) ×4 IMPLANT
GLOVE NEODERM STRL 7.5  LF PF (GLOVE) ×9
GLOVE NEODERM STRL 7.5 LF PF (GLOVE) ×9 IMPLANT
GLOVE SURG GAMMEX LF SZ7.5 (GLOVE) ×1 IMPLANT
GLOVE SURG NEODERM 7.5  LF PF (GLOVE) ×3
GOWN STRL REUS W/ TWL LRG LVL3 (GOWN DISPOSABLE) ×12 IMPLANT
GOWN STRL REUS W/TWL LRG LVL3 (GOWN DISPOSABLE) ×36
HEMOSTAT POWDER SURGIFOAM 1G (HEMOSTASIS) ×8 IMPLANT
HEMOSTAT SURGICEL 2X4 FIBR (HEMOSTASIS) ×1 IMPLANT
INSERT FOGARTY XLG (MISCELLANEOUS) ×4 IMPLANT
INSERT SUTURE HOLDER (MISCELLANEOUS) ×4 IMPLANT
KIT APPLICATOR RATIO 11:1 (KITS) ×1 IMPLANT
KIT BASIN OR (CUSTOM PROCEDURE TRAY) ×4 IMPLANT
KIT SUCTION CATH 14FR (SUCTIONS) ×4 IMPLANT
KIT TURNOVER KIT B (KITS) ×4 IMPLANT
KIT VASOVIEW HEMOPRO 2 VH 4000 (KITS) ×3 IMPLANT
MARKER GRAFT CORONARY BYPASS (MISCELLANEOUS) ×12 IMPLANT
NDL 18GX1X1/2 (RX/OR ONLY) (NEEDLE) ×3 IMPLANT
NEEDLE 18GX1X1/2 (RX/OR ONLY) (NEEDLE) ×8 IMPLANT
NS IRRIG 1000ML POUR BTL (IV SOLUTION) ×20 IMPLANT
PACK E OPEN HEART (SUTURE) ×4 IMPLANT
PACK OPEN HEART (CUSTOM PROCEDURE TRAY) ×4 IMPLANT
PACK PLATELET PROCEDURE 60 (MISCELLANEOUS) ×1 IMPLANT
PACK SPY-PHI (KITS) ×1 IMPLANT
PAD ARMBOARD 7.5X6 YLW CONV (MISCELLANEOUS) ×8 IMPLANT
PAD ELECT DEFIB RADIOL ZOLL (MISCELLANEOUS) ×4 IMPLANT
PENCIL BUTTON HOLSTER BLD 10FT (ELECTRODE) ×4 IMPLANT
POSITIONER HEAD DONUT 9IN (MISCELLANEOUS) ×4 IMPLANT
POWDER SURGICEL 3.0 GRAM (HEMOSTASIS) ×5 IMPLANT
PUNCH AORTIC ROTATE 4.5MM 8IN (MISCELLANEOUS) ×1 IMPLANT
SEALANT SURG COSEAL 4ML (VASCULAR PRODUCTS) ×4 IMPLANT
SEALANT SURG COSEAL 8ML (VASCULAR PRODUCTS) ×1 IMPLANT
SET CARDIOPLEGIA MPS 5001102 (MISCELLANEOUS) ×1 IMPLANT
SHEARS HARMONIC 9CM CVD (BLADE) ×1 IMPLANT
STAPLER VISISTAT 35W (STAPLE) ×1 IMPLANT
SUT BONE WAX W31G (SUTURE) ×4 IMPLANT
SUT MNCRL AB 3-0 PS2 18 (SUTURE) ×8 IMPLANT
SUT PDS AB 1 CTX 36 (SUTURE) ×8 IMPLANT
SUT PROLENE 3 0 SH DA (SUTURE) ×4 IMPLANT
SUT PROLENE 5 0 C 1 36 (SUTURE) IMPLANT
SUT PROLENE 6 0 C 1 30 (SUTURE) ×13 IMPLANT
SUT PROLENE 8 0 BV175 6 (SUTURE) ×1 IMPLANT
SUT PROLENE BLUE 7 0 (SUTURE) ×4 IMPLANT
SUT SILK  1 MH (SUTURE) ×4
SUT SILK 1 MH (SUTURE) IMPLANT
SUT SILK 2 0 SH CR/8 (SUTURE) IMPLANT
SUT SILK 3 0 SH CR/8 (SUTURE) IMPLANT
SUT STEEL 6MS V (SUTURE) ×4 IMPLANT
SUT STEEL SZ 6 DBL 3X14 BALL (SUTURE) ×4 IMPLANT
SUT VIC AB 2-0 CT1 27 (SUTURE) ×4
SUT VIC AB 2-0 CT1 TAPERPNT 27 (SUTURE) IMPLANT
SUT VIC AB 2-0 CTX 27 (SUTURE) IMPLANT
SUT VIC AB 3-0 SH 27 (SUTURE)
SUT VIC AB 3-0 SH 27X BRD (SUTURE) IMPLANT
SUT VIC AB 3-0 X1 27 (SUTURE) IMPLANT
SYR 10ML LL (SYRINGE) IMPLANT
SYR 30ML LL (SYRINGE) ×4 IMPLANT
SYR 3ML LL SCALE MARK (SYRINGE) ×4 IMPLANT
SYR 50ML SLIP (SYRINGE) IMPLANT
SYSTEM SAHARA CHEST DRAIN ATS (WOUND CARE) ×4 IMPLANT
TIP DUAL SPRAY TOPICAL (TIP) ×2 IMPLANT
TOWEL GREEN STERILE (TOWEL DISPOSABLE) ×4 IMPLANT
TOWEL GREEN STERILE FF (TOWEL DISPOSABLE) ×4 IMPLANT
TRAY FOLEY SLVR 16FR TEMP STAT (SET/KITS/TRAYS/PACK) ×4 IMPLANT
TUBING LAP HI FLOW INSUFFLATIO (TUBING) ×3 IMPLANT
UNDERPAD 30X36 HEAVY ABSORB (UNDERPADS AND DIAPERS) ×4 IMPLANT
WATER STERILE IRR 1000ML POUR (IV SOLUTION) ×8 IMPLANT
WATER STERILE IRR 1000ML UROMA (IV SOLUTION) IMPLANT

## 2021-01-05 NOTE — Brief Op Note (Addendum)
01/05/2021  11:27 AM  PATIENT:  Rodney Strickland  55 y.o. male  PRE-OPERATIVE DIAGNOSIS:  CORONARY ARTERY DISEASE  POST-OPERATIVE DIAGNOSIS:  CORONARY ARTERY DISEASE  PROCEDURE:  Procedure(s):  CORONARY ARTERY BYPASS GRAFTING x 4 -LIMA to LAD -SEQUENTIAL SVG to OM 1 and OM 2 -RIMA to PDA  OPEN HARVEST LEFT RADIAL ARTERY -32 min/5 min  TRANSESOPHAGEAL ECHOCARDIOGRAM (TEE) (N/A)  INDOCYANINE GREEN FLUORESCENCE IMAGING (ICG) (N/A)  SURGEON:  Surgeon(s) and Role:    * [name], MD - Primary  PHYSICIAN ASSISTANT: Lowella Dandy PA-C  ANESTHESIA:   general  EBL:  Per anes record   BLOOD ADMINISTERED: CELLSAVER  DRAINS: Left and Right Pleural Chest Tubes, Mediastinal Chest Drains   LOCAL MEDICATIONS USED:  NONE  SPECIMEN:  No Specimen  DISPOSITION OF SPECIMEN:  N/A  COUNTS:  YES  TOURNIQUET:  * No tourniquets in log *  DICTATION: .Dragon Dictation  PLAN OF CARE: Admit to inpatient   PATIENT DISPOSITION:  ICU - intubated and hemodynamically stable.   Delay start of Pharmacological VTE agent (>24hrs) due to surgical blood loss or risk of bleeding: yes

## 2021-01-05 NOTE — H&P (Signed)
History and Physical Interval Note:  01/05/2021 7:15 AM  Rodney Strickland  has presented today for surgery, with the diagnosis of CAD.  The various methods of treatment have been discussed with the patient and family. After consideration of risks, benefits and other options for treatment, the patient has consented to  Procedure(s): CORONARY ARTERY BYPASS GRAFTING (CABG), BIMA (N/A) RADIAL ARTERY HARVEST (Left) TRANSESOPHAGEAL ECHOCARDIOGRAM (TEE) (N/A) INDOCYANINE GREEN FLUORESCENCE IMAGING (ICG) (N/A) as a surgical intervention.  The patient's history has been reviewed, patient examined, no change in status, stable for surgery.  I have reviewed the patient's chart and labs.  Questions were answered to the patient's satisfaction.     [name]

## 2021-01-05 NOTE — Progress Notes (Signed)
      [address]       [place] [address]             [phone]      S/p CABG x 4  Extubated, sleeping BP 128/74 (BP Location: Right Arm)   Pulse 89   Temp 98.6 F (37 C) (Core)   Resp (!) 23   Ht 5\' 6"  (1.676 m)   Wt 72.6 kg   SpO2 93%   BMI 25.82 kg/m  3 L [place] 93% sat 19/8 CI =2.1  On 50 mcg/min NTG  Intake/Output Summary (Last 24 hours) at 01/05/2021 1753 Last data filed at 01/05/2021 1700 Gross per 24 hour  Intake 4273.17 ml  Output 1863 ml  Net 2410.17 ml   CT 275 since OR Hct= 27  Doing well early postop  01/07/2021 C. Viviann Spare, MD [facility] [phone]

## 2021-01-05 NOTE — Anesthesia Procedure Notes (Signed)
Arterial Line Insertion Start/End2/24/2022 6:40 AM, 01/05/2021 6:50 AM Performed by: Nils Pyle, CRNA, CRNA  Patient location: Pre-op. Preanesthetic checklist: patient identified, IV checked, site marked, risks and benefits discussed, surgical consent, monitors and equipment checked, pre-op evaluation and anesthesia consent Lidocaine 1% used for infiltration and patient sedated Right, radial was placed Catheter size: 20 G Hand hygiene performed  and maximum sterile barriers used  Allen's test indicative of satisfactory collateral circulation Attempts: 1 Procedure performed without using ultrasound guided technique. Ultrasound Notes:anatomy identified, needle tip was noted to be adjacent to the nerve/plexus identified and no ultrasound evidence of intravascular and/or intraneural injection Following insertion, dressing applied and Biopatch. Post procedure assessment: normal  Patient tolerated the procedure well with no immediate complications.

## 2021-01-05 NOTE — Discharge Instructions (Signed)

## 2021-01-05 NOTE — Hospital Course (Addendum)
History of Present Illness:  Rodney Strickland is a 55 yo male with history of HTN and Hypercholesterolemia.  He also has a history of lung injury on both sides from the 1980s.  He had been in his usual state of health until he recently developed fatigue.  He would also experience vague chest discomfort with exertion.  He underwent a coronary CT scan which was positive for CAD.  Subsequently he was set up for a cardiac catheterization which revealed a normal EF and severe 3 vessel CAD.  It was felt the patient would benefit from coronary bypass grafting and he was referred to [facility] for evaluation.  He was evaluated by Dr. Vickey Sages on 01/02/2021 at which time it was felt he would best be treated by coronary bypass.  The risks and benefits of the procedure were explained to the patient and he was agreeable to proceed.   Hospital Course:  Rodney Strickland presented to [facility] on 01/04/2021.  He was taken to the operating room and underwent CABG x 4 utilizing LIMA to LAD, sequential radial artery to OM 1 and OM 2, and RIMA to PDA.  He also underwent open harvest of his left radial artery.  He tolerated the procedure without difficulty and was taken to the SICU in stable condition. He was extubated the evening of surgery.  During his stay in the SICU the patient was weaned off NTG and transitioned to Isordil for his radial artery.  He developed ST changes which was felt to be consistent with Pericarditis.  He was on Colchicine for this.  The patient's arterial lines were removed without difficulty.  The patient continued to complain of pain despite pain medications.  Lidocaine patches were added for additional relief.  He was maintaining NSR and felt stable for transfer to the progressive care unit on 01/07/2021.  The patient was mildly tachycardic.  His lopressor was increased accordingly.  His pacing wires were removed without difficulty.  His chest tube output decreased and his tubes were  removed on 01/09/2021.  Follow up CXR showed ***. He had poor appetite and was supplemented with Ensure.  His bowel was slow to resume function.  He was treated with Miralax and Reglan for this.  He had expected post operative blood loss anemia with Hgb at 8.2.  He was treated with iron supplementation.  The patient's creatinine continued to improve.  He was hyperkalemic at 5 and his supplementation was discontinued.  His bowel function recovered and he moved his bowels without difficulty.

## 2021-01-05 NOTE — Procedures (Signed)
Extubation Procedure Note  Patient Details:   Name: Rodney Strickland DOB: 1966-04-19 MRN: [id number]   Airway Documentation:    Vent end date: 01/05/21 Vent end time: 1730   Evaluation  O2 sats: stable throughout Complications: No apparent complications Patient did tolerate procedure well. Bilateral Breath Sounds: Clear,Diminished   Yes   Pt was extubated per rapid wean protocol and placed on 4 L [place]. Nif was -20 and VC 1L prior to extubation. Cuff leak was noted prior and no stridor post. Pt is stable at this time. RT will monitor.   Merlene Laughter 01/05/2021, 5:35 PM

## 2021-01-05 NOTE — Op Note (Signed)
CARDIOTHORACIC SURGERY OPERATIVE NOTE  Date of Procedure: 01/05/2021  Preoperative Diagnosis: Severe 3-vessel Coronary Artery Disease  Postoperative Diagnosis: Same  Procedure:    Coronary Artery Bypass Grafting x 4  Left Internal Mammary Artery to Distal Left Anterior Descending Coronary Artery, pedicled right internal mammary artery graft to Posterior Descending Coronary Artery; left radial artery graft to first and second obtuse Marginal Branches of Left Circumflex Coronary Artery Bilateral internal mammary artery harvesting Open left radial artery harvesting Completion graft surveillance with indocyanine green fluorescence imaging  Surgeon: B.  Lorayne Marek, MD  Assistant: Jaclyn Prime, PA-C  Anesthesia: General endotracheal  Operative Findings:  Preserved left ventricular systolic function  Good quality internal mammary artery conduits  Good quality radial artery conduit  Good quality target vessels for grafting    BRIEF CLINICAL NOTE AND INDICATIONS FOR SURGERY  55 year old gentleman who began to experience increased fatigue and occasional chest pain.  He underwent CT coronary angiogram which demonstrated suspicion for severe coronary disease.  This was confirmed by left heart catheterization.  He was referred for consideration of CABG.  He has been thoroughly evaluated and is considered a great candidate for surgery.   DETAILS OF THE OPERATIVE PROCEDURE  Preparation:  The patient is brought to the operating room on the above mentioned date and central monitoring was established by the anesthesia team including placement of Swan-Ganz catheter and radial arterial line. The patient is placed in the supine position on the operating table.  Intravenous antibiotics are administered. General endotracheal anesthesia is induced uneventfully. A Foley catheter is placed.  Baseline transesophageal echocardiogram was performed.  Findings were notable for insignificant valve  disease and preserved LV function  The patient's chest, abdomen, both groins, the left upper extremity, and both lower extremities are prepared and draped in a sterile manner. A time out procedure is performed.   Surgical Approach and Conduit Harvest:  Attention was first turned to the left forearm where the radial artery is harvested by an open technique.  Once the graft is removed the incision is repaired in layers and dressed, and the left arm is retucked at the side.  A median sternotomy incision was performed and the left internal mammary artery is dissected from the chest wall and prepared for bypass grafting. The left internal mammary artery is notably good quality conduit.  Next, the right internal mammary artery and its pedicle are mobilized in standard fashion.  Prior to dividing the pedicle distally full dose heparin is given intravenously.  Following systemic heparinization, the internal mammary artery pedicles were transected distally noted to have excellent flow.  Both the left and right mammary artery were treated with a solution of papaverine.   Extracorporeal Cardiopulmonary Bypass and Myocardial Protection:  The pericardium is opened. The ascending aorta is nondiseased in appearance. The ascending aorta and the right atrium are cannulated for cardiopulmonary bypass.  Adequate heparinization is verified.   .  Cardiopulmonary bypass was begun and the surface of the heart is inspected. Distal target vessels are selected for coronary artery bypass grafting. A cardioplegia cannula is placed in the ascending aorta.   The patient is allowed to cool passively to 34C systemic temperature.  The aortic cross clamp is applied and cold blood cardioplegia is delivered initially in an antegrade fashion through the aortic root.  Iced saline slush is applied for topical hypothermia.  The initial cardioplegic arrest is rapid with early diastolic arrest.  Repeat doses of cardioplegia are  administered intermittently throughout the entire  cross clamp portion of the operation through the aortic root  and through subsequently placed vein grafts in order to maintain completely flat electrocardiogram.   Coronary Artery Bypass Grafting:   The  posterior descending branch of the right coronary artery was grafted using the pedicled right internal mammary artery graft in an end-to-side fashion.  At the site of distal anastomosis the target vessel was good quality and measured approximately 1.5 mm in diameter. Anastomotic patency and runoff was confirmed with indocyanine green fluorescence imaging (SPY).  The first and second obtuse marginal branches of the left circumflex coronary artery was grafted using the left radial artery graft in a sequenced fashion.  At the site of distal anastomoses the target vessel were good quality and measured approximately 1.5 mm in diameter. Anastomotic patency and runoff was confirmed with indocyanine green fluorescence imaging (SPY).    The distal left anterior coronary artery was grafted with the left internal mammary artery in an end-to-side fashion.  At the site of distal anastomosis the target vessel was good quality and measured approximately 1.5 mm in diameter. Anastomotic patency and runoff was confirmed with indocyanine green fluorescence imaging (SPY).  The proximal radial artery graft anastomosis was placed directly to the ascending aorta prior to removal of the aortic cross clamp.  A reanimation dose of cardioplegia was given down the aortic root; de-airing procedures were performed and the aortic cross-clamp was removed.   Procedure Completion:  All proximal and distal coronary anastomoses were inspected for hemostasis and appropriate graft orientation. Epicardial pacing wires are fixed to the right ventricular outflow tract and to the right atrial appendage. The patient is rewarmed to 37C temperature. The patient is weaned and disconnected  from cardiopulmonary bypass.  The patient's rhythm at separation from bypass was sinus bradycardia.  The patient was weaned from cardiopulmonary bypass without any inotropic support.  Followup transesophageal echocardiogram performed after separation from bypass revealed no changes from the preoperative exam.  The aortic and venous cannula were removed uneventfully. Protamine was administered to reverse the anticoagulation. The mediastinum and pleural space were inspected for hemostasis and irrigated with saline solution. The mediastinum and both pleural spaces were drained using fluted chest tubes placed through separate stab incisions inferiorly.  The soft tissues anterior to the aorta were reapproximated loosely. The sternum is closed with double strength sternal wire. The soft tissues anterior to the sternum were closed in multiple layers and the skin is closed with a running subcuticular skin closure.  The post-bypass portion of the operation was notable for stable rhythm and hemodynamics.  No blood products were administered during the operation.   Disposition:  The patient tolerated the procedure well and is transported to the surgical intensive care in stable condition. There are no intraoperative complications. All sponge instrument and needle counts are verified correct at completion of the operation.    Brantley Fling, MD 01/05/2021 12:50 PM

## 2021-01-05 NOTE — Progress Notes (Signed)
Per Dr. Dorris Fetch, give one amp of Bicarb for previous HCO3 of 19.7. Repeat result K+ with BMET.

## 2021-01-05 NOTE — Progress Notes (Signed)
Echocardiogram Echocardiogram Transesophageal has been performed.  Rodney Strickland F [name] 01/05/2021, 10:01 AM 

## 2021-01-05 NOTE — Transfer of Care (Signed)
Immediate Anesthesia Transfer of Care Note  Patient: Rodney Strickland  Procedure(s) Performed: CORONARY ARTERY BYPASS GRAFTING (CABG) TIMES FOUR, USING BILATERAL INTERNAL MAMMARY ARTERIES AND LEFT RADIAL ARTERY (N/A Chest) RADIAL ARTERY HARVEST (Left Arm Lower) TRANSESOPHAGEAL ECHOCARDIOGRAM (TEE) (N/A ) INDOCYANINE GREEN FLUORESCENCE IMAGING (ICG) (N/A )  Patient Location: ICU  Anesthesia Type:General  Level of Consciousness: sedated, unresponsive and Patient remains intubated per anesthesia plan  Airway & Oxygen Therapy: Patient remains intubated per anesthesia plan and Patient placed on Ventilator (see vital sign flow sheet for setting)  Post-op Assessment: Report given to RN and Post -op Vital signs reviewed and stable  Post vital signs: Reviewed and stable  Last Vitals:  Vitals Value Taken Time  BP 117/87 01/05/21 1300  Temp 35.3 C 01/05/21 1302  Pulse 80 01/05/21 1302  Resp 10 01/05/21 1302  SpO2 100 % 01/05/21 1302  Vitals shown include unvalidated device data.  Last Pain:  Vitals:   01/05/21 0619  TempSrc:   PainSc: 0-No pain         Complications: No complications documented.

## 2021-01-05 NOTE — Anesthesia Postprocedure Evaluation (Signed)
Anesthesia Post Note  Patient: Rodney Strickland  Procedure(s) Performed: CORONARY ARTERY BYPASS GRAFTING (CABG) TIMES FOUR, USING BILATERAL INTERNAL MAMMARY ARTERIES AND LEFT RADIAL ARTERY (N/A Chest) RADIAL ARTERY HARVEST (Left Arm Lower) TRANSESOPHAGEAL ECHOCARDIOGRAM (TEE) (N/A ) INDOCYANINE GREEN FLUORESCENCE IMAGING (ICG) (N/A )     Patient location during evaluation: SICU Anesthesia Type: General Level of consciousness: sedated Pain management: pain level controlled Vital Signs Assessment: post-procedure vital signs reviewed and stable Respiratory status: patient remains intubated per anesthesia plan Cardiovascular status: stable Postop Assessment: no apparent nausea or vomiting Anesthetic complications: no   No complications documented.  Last Vitals:  Vitals:   01/05/21 1501 01/05/21 1502  BP:    Pulse: 89 89  Resp: 12 12  Temp: (!) 35.9 C (!) 35.9 C  SpO2: 100% 100%    Last Pain:  Vitals:   01/05/21 0619  TempSrc:   PainSc: 0-No pain                 Quantavious Eggert,W. EDMOND

## 2021-01-05 NOTE — Anesthesia Procedure Notes (Signed)
Central Venous Catheter Insertion Performed by: Kipp Brood, MD, anesthesiologist Start/End2/24/2022 6:50 AM, 01/05/2021 7:00 AM Patient location: Pre-op. Preanesthetic checklist: patient identified, IV checked, site marked, risks and benefits discussed, surgical consent, monitors and equipment checked, pre-op evaluation, timeout performed and anesthesia consent Position: Trendelenburg Hand hygiene performed  and maximum sterile barriers used  PA cath was placed.Swan type:thermodilution Procedure performed without using ultrasound guided technique. Attempts: 1 Following insertion, line sutured, dressing applied and Biopatch. Post procedure assessment: no air, free fluid flow and blood return through all ports  Patient tolerated the procedure well with no immediate complications.

## 2021-01-05 NOTE — Anesthesia Procedure Notes (Signed)
Central Venous Catheter Insertion Performed by: Kipp Brood, MD, anesthesiologist Start/End2/24/2022 6:50 AM, 01/05/2021 7:00 AM Patient location: Pre-op. Preanesthetic checklist: patient identified, IV checked, site marked, risks and benefits discussed, surgical consent, monitors and equipment checked, pre-op evaluation, timeout performed and anesthesia consent Lidocaine 1% used for infiltration and patient sedated Hand hygiene performed  and maximum sterile barriers used  Catheter size: 9 Fr Sheath introducer Procedure performed using ultrasound guided technique. Ultrasound Notes:anatomy identified, needle tip was noted to be adjacent to the nerve/plexus identified, no ultrasound evidence of intravascular and/or intraneural injection and image(s) printed for medical record Attempts: 1 Following insertion, line sutured and dressing applied. Post procedure assessment: blood return through all ports, free fluid flow and no air  Patient tolerated the procedure well with no immediate complications.

## 2021-01-05 NOTE — Anesthesia Procedure Notes (Signed)
Procedure Name: Intubation Date/Time: 01/05/2021 7:54 AM Performed by: Nils Pyle, CRNA Pre-anesthesia Checklist: Patient identified, Emergency Drugs available, Suction available and Patient being monitored Patient Re-evaluated:Patient Re-evaluated prior to induction Oxygen Delivery Method: Circle System Utilized Preoxygenation: Pre-oxygenation with 100% oxygen Induction Type: IV induction Ventilation: Mask ventilation without difficulty and Oral airway inserted - appropriate to patient size Laryngoscope Size: Miller, 2, Glidescope and 4 Grade View: Grade I Tube type: Oral Tube size: 8.0 mm Number of attempts: 2 Airway Equipment and Method: Stylet,  Oral airway and Video-laryngoscopy Placement Confirmation: ETT inserted through vocal cords under direct vision,  positive ETCO2 and breath sounds checked- equal and bilateral Secured at: 23 cm Tube secured with: Tape Dental Injury: Teeth and Oropharynx as per pre-operative assessment  Difficulty Due To: Difficult Airway- due to anterior larynx Comments: Grade 3 view obtained by Hyacinth Meeker 2. Easy, atraumatic glidescope intubation.

## 2021-01-06 ENCOUNTER — Encounter (HOSPITAL_COMMUNITY): Payer: Self-pay | Admitting: Cardiothoracic Surgery

## 2021-01-06 ENCOUNTER — Inpatient Hospital Stay (HOSPITAL_COMMUNITY): Payer: Federal, State, Local not specified - PPO

## 2021-01-06 DIAGNOSIS — I308 Other forms of acute pericarditis: Secondary | ICD-10-CM

## 2021-01-06 DIAGNOSIS — Z951 Presence of aortocoronary bypass graft: Secondary | ICD-10-CM

## 2021-01-06 LAB — PREPARE RBC (CROSSMATCH)

## 2021-01-06 LAB — GLUCOSE, CAPILLARY
Glucose-Capillary: 112 mg/dL — ABNORMAL HIGH (ref 70–99)
Glucose-Capillary: 114 mg/dL — ABNORMAL HIGH (ref 70–99)
Glucose-Capillary: 133 mg/dL — ABNORMAL HIGH (ref 70–99)
Glucose-Capillary: 133 mg/dL — ABNORMAL HIGH (ref 70–99)
Glucose-Capillary: 139 mg/dL — ABNORMAL HIGH (ref 70–99)
Glucose-Capillary: 144 mg/dL — ABNORMAL HIGH (ref 70–99)
Glucose-Capillary: 144 mg/dL — ABNORMAL HIGH (ref 70–99)
Glucose-Capillary: 82 mg/dL (ref 70–99)
Glucose-Capillary: 95 mg/dL (ref 70–99)

## 2021-01-06 LAB — CBC
HCT: 22.9 % — ABNORMAL LOW (ref 39.0–52.0)
HCT: 23.5 % — ABNORMAL LOW (ref 39.0–52.0)
Hemoglobin: 8.1 g/dL — ABNORMAL LOW (ref 13.0–17.0)
Hemoglobin: 8.4 g/dL — ABNORMAL LOW (ref 13.0–17.0)
MCH: 31.5 pg (ref 26.0–34.0)
MCH: 31.3 pg (ref 26.0–34.0)
MCHC: 35.4 g/dL (ref 30.0–36.0)
MCHC: 35.7 g/dL (ref 30.0–36.0)
MCV: 89.1 fL (ref 80.0–100.0)
MCV: 87.7 fL (ref 80.0–100.0)
Platelets: 120 10*3/uL — ABNORMAL LOW (ref 150–400)
Platelets: 124 10*3/uL — ABNORMAL LOW (ref 150–400)
RBC: 2.57 MIL/uL — ABNORMAL LOW (ref 4.22–5.81)
RBC: 2.68 MIL/uL — ABNORMAL LOW (ref 4.22–5.81)
RDW: 12.3 % (ref 11.5–15.5)
RDW: 13.8 % (ref 11.5–15.5)
WBC: 7.9 10*3/uL (ref 4.0–10.5)
WBC: 10.9 10*3/uL — ABNORMAL HIGH (ref 4.0–10.5)
nRBC: 0 % (ref 0.0–0.2)
nRBC: 0 % (ref 0.0–0.2)

## 2021-01-06 LAB — BASIC METABOLIC PANEL
Anion gap: 10 (ref 5–15)
Anion gap: 7 (ref 5–15)
BUN: 40 mg/dL — ABNORMAL HIGH (ref 6–20)
BUN: 42 mg/dL — ABNORMAL HIGH (ref 6–20)
CO2: 20 mmol/L — ABNORMAL LOW (ref 22–32)
CO2: 23 mmol/L (ref 22–32)
Calcium: 7.3 mg/dL — ABNORMAL LOW (ref 8.9–10.3)
Calcium: 7 mg/dL — ABNORMAL LOW (ref 8.9–10.3)
Chloride: 104 mmol/L (ref 98–111)
Chloride: 102 mmol/L (ref 98–111)
Creatinine, Ser: 1.75 mg/dL — ABNORMAL HIGH (ref 0.61–1.24)
Creatinine, Ser: 1.51 mg/dL — ABNORMAL HIGH (ref 0.61–1.24)
GFR, Estimated: 45 mL/min — ABNORMAL LOW (ref >60)
GFR, Estimated: 54 mL/min — ABNORMAL LOW (ref >60)
Glucose, Bld: 154 mg/dL — ABNORMAL HIGH (ref 70–99)
Glucose, Bld: 137 mg/dL — ABNORMAL HIGH (ref 70–99)
Potassium: 4.8 mmol/L (ref 3.5–5.1)
Potassium: 4.1 mmol/L (ref 3.5–5.1)
Sodium: 134 mmol/L — ABNORMAL LOW (ref 135–145)
Sodium: 132 mmol/L — ABNORMAL LOW (ref 135–145)

## 2021-01-06 LAB — MAGNESIUM
Magnesium: 2.9 mg/dL — ABNORMAL HIGH (ref 1.7–2.4)
Magnesium: 2.8 mg/dL — ABNORMAL HIGH (ref 1.7–2.4)

## 2021-01-06 LAB — PREPARE FRESH FROZEN PLASMA: Unit division: 0

## 2021-01-06 LAB — BPAM FFP
Blood Product Expiration Date: 202203012359
ISSUE DATE / TIME: 202202241337
Unit Type and Rh: 7300

## 2021-01-06 MED ORDER — KETOROLAC TROMETHAMINE 15 MG/ML IJ SOLN
7.5000 mg | Freq: Four times a day (QID) | INTRAMUSCULAR | Status: AC
  Administered 2021-01-06 – 2021-01-07 (×5): 7.5 mg via INTRAVENOUS
  Filled 2021-01-06 (×5): qty 1

## 2021-01-06 MED ORDER — SODIUM CHLORIDE 0.9% IV SOLUTION: Freq: Once | INTRAVENOUS | Status: DC

## 2021-01-06 MED ORDER — COLCHICINE 0.3 MG HALF TABLET
0.3000 mg | ORAL_TABLET | Freq: Two times a day (BID) | ORAL | Status: DC
  Administered 2021-01-06 (×2): 0.3 mg via ORAL
  Filled 2021-01-06 (×4): qty 1

## 2021-01-06 MED ORDER — DIAZEPAM 2 MG PO TABS
2.0000 mg | ORAL_TABLET | Freq: Three times a day (TID) | ORAL | Status: AC
  Administered 2021-01-06 – 2021-01-07 (×4): 2 mg via ORAL
  Filled 2021-01-06 (×5): qty 1

## 2021-01-06 MED ORDER — ISOSORBIDE DINITRATE 10 MG PO TABS
10.0000 mg | ORAL_TABLET | Freq: Three times a day (TID) | ORAL | Status: DC
  Administered 2021-01-06 – 2021-01-10 (×13): 10 mg via ORAL
  Filled 2021-01-06 (×13): qty 1

## 2021-01-06 MED ORDER — THIAMINE HCL 100 MG/ML IJ SOLN
Freq: Once | INTRAVENOUS | Status: AC
  Filled 2021-01-06: qty 1000

## 2021-01-06 MED ORDER — METOPROLOL TARTRATE 25 MG PO TABS: 25.0000 mg | ORAL_TABLET | Freq: Two times a day (BID) | ORAL | Status: DC

## 2021-01-06 MED ORDER — METOPROLOL TARTRATE 25 MG PO TABS
25.0000 mg | ORAL_TABLET | Freq: Two times a day (BID) | ORAL | Status: DC
  Administered 2021-01-06 – 2021-01-10 (×9): 25 mg via ORAL
  Filled 2021-01-06 (×9): qty 1

## 2021-01-06 NOTE — Progress Notes (Signed)
Progress Note  Patient Name: Rodney Strickland Date of Encounter: 01/06/2021  [facility] Cardiologist: Marnisha Stampley   Subjective   55 yo , S/p CABG for severe CAD- POD 1 Has post op pericarditis / friction rub  ECG shows diffuse ST elevation which is c/w pericarditis .   Inpatient Medications    Scheduled Meds:  sodium chloride   Intravenous Once   sodium chloride   Intravenous Once   acetaminophen  1,000 mg Oral Q6H   Or   acetaminophen (TYLENOL) oral liquid 160 mg/5 mL  1,000 mg Per Tube Q6H   allopurinol  100 mg Oral Daily   aspirin EC  325 mg Oral Daily   Or   aspirin  324 mg Per Tube Daily   bisacodyl  10 mg Oral Daily   Or   bisacodyl  10 mg Rectal Daily   chlorhexidine gluconate (MEDLINE KIT)  15 mL Mouth Rinse BID   Chlorhexidine Gluconate Cloth  6 each Topical Daily   colchicine  0.3 mg Oral BID   diazepam  2 mg Oral Q8H   docusate sodium  200 mg Oral Daily   insulin aspart  0-24 Units Subcutaneous Q4H   isosorbide dinitrate  10 mg Oral TID   ketorolac  7.5 mg Intravenous Q6H   metoprolol tartrate  12.5 mg Oral BID   Or   metoprolol tartrate  12.5 mg Per Tube BID   [START ON 01/07/2021] pantoprazole  40 mg Oral Daily   rosuvastatin  10 mg Oral QHS   sodium chloride flush  10-40 mL Intracatheter Q12H   sodium chloride flush  3 mL Intravenous Q12H   Continuous Infusions:  sodium chloride Stopped (01/05/21 1946)   sodium chloride     sodium chloride 10 mL/hr at 01/05/21 1306   albumin human 12.5 g (01/06/21 0151)   cefUROXime (ZINACEF)  IV 1.5 g (01/05/21 2357)   dexmedetomidine (PRECEDEX) IV infusion 0.2 mcg/kg/hr (01/06/21 0157)   famotidine (PEPCID) IV Stopped (01/05/21 1348)   banana bag IV 1000 mL     lactated ringers     lactated ringers     lactated ringers 20 mL/hr at 01/05/21 2000   nitroGLYCERIN 5 mcg/min (01/05/21 2000)   phenylephrine (NEO-SYNEPHRINE) Adult infusion Stopped (01/05/21 1509)   PRN  Meds: sodium chloride, albumin human, lactated ringers, loratadine, metoprolol tartrate, midazolam, morphine injection, ondansetron (ZOFRAN) IV, oxyCODONE, sodium chloride flush, sodium chloride flush, traMADol   Vital Signs    Vitals:   01/06/21 0400 01/06/21 0415 01/06/21 0500 01/06/21 0600  BP: 105/75  101/72 110/72  Pulse: 89 89 89 88  Resp: (!) _0 (!) 25  Temp: 98.6 F (37 C) 98.78 F (37.1 C) 98.6 F (37 C) 98.6 F (37 C)  TempSrc:      SpO2: 99% 98% 99% 100%  Weight: 77.8 kg     Height:        Intake/Output Summary (Last 24 hours) at 01/06/2021 0858 Last data filed at 01/06/2021 0624 Gross per 24 hour  Intake 5099.64 ml  Output 3283 ml  Net 1816.64 ml   Last 3 Weights 01/06/2021 01/05/2021 01/03/2021  Weight (lbs) 171 lb 8.3 oz 160 lb 163 lb 3.2 oz  Weight (kg) 77.8 kg 72.576 kg 74.027 kg      Telemetry    Sinus tach  - Personally Reviewed  ECG      Personally Reviewed  Physical Exam    GEN:  middle age man, slightly uncomfortable  Neck: No JVD Cardiac: RRR, ,tachy,  + friction rub  Respiratory: Clear to auscultation bilaterally. GI: Soft, nontender, non-distended  MS: No edema; No deformity. Neuro:  Nonfocal  Psych: Normal affect   Labs    High Sensitivity Troponin:  No results for input(s): TROPONINIHS in the last 720 hours.    Chemistry Recent Labs  Lab 01/03/21 1200 01/05/21 0800 01/05/21 1152 01/05/21 1156 01/05/21 1830 01/05/21 1834 01/06/21 0348  NA 133*   < > 130*   < > 134* 134* 134*  K 4.6   < > 5.7*   < > 4.8 5.1 4.8  CL 108   < > 99  --  107  --  104  CO2 18*  --   --   --  20*  --  20*  GLUCOSE 104*   < > 94  --  144*  --  154*  BUN 38*   < > 39*  --  37*  --  40*  CREATININE 1.37*   < > 1.40*  --  1.51*  --  1.75*  CALCIUM 8.8*  --   --   --  6.7*  --  7.3*  PROT 4.6*  --   --   --   --   --   --   ALBUMIN 1.8*  --   --   --   --   --   --   AST 18  --   --   --   --   --   --   ALT 13  --   --   --   --   --   --    ALKPHOS 45  --   --   --   --   --   --   BILITOT 0.7  --   --   --   --   --   --   GFRNONAA >60  --   --   --  54*  --  45*  ANIONGAP 7  --   --   --  7  --  10   < > = values in this interval not displayed.     Hematology Recent Labs  Lab 01/05/21 1330 01/05/21 1712 01/05/21 1830 01/05/21 1834 01/06/21 0348  WBC 10.1  --  10.0  --  7.9  RBC 3.02*  --  2.69*  --  2.57*  HGB 9.8*   < > 8.5* 8.2* 8.1*  HCT 26.6*   < > 24.3* 24.0* 22.9*  MCV 88.1  --  90.3  --  89.1  MCH 32.5  --  31.6  --  31.5  MCHC 36.8*  --  35.0  --  35.4  RDW 11.9  --  12.2  --  12.3  PLT 119*  --  120*  --  120*   < > = values in this interval not displayed.    BNPNo results for input(s): BNP, PROBNP in the last 168 hours.   DDimer No results for input(s): DDIMER in the last 168 hours.   Radiology    DG Chest Port 1 View  Result Date: 01/06/2021 CLINICAL DATA:  Chest pain.  Status postextubation EXAM: PORTABLE CHEST 1 VIEW COMPARISON:  January 05, 2021 FINDINGS: Endotracheal tube and nasogastric tube have been removed. Swan-Ganz catheter tip is in the main pulmonary outflow tract, unchanged. Left chest tube as well as mediastinal drains are unchanged in position. Temporary pacemaker wires are attached to  the right heart. No pneumothorax. There is atelectatic change in portions of the right mid lung and right base regions. There is ill-defined patchy opacity in the left lower lobe, new from 1 day prior. Ill-defined airspace opacity in the right upper lobe is stable. There is new ill-defined airspace opacity in the right base. Heart is mildly enlarged with pulmonary vascularity normal. No adenopathy. There is aortic atherosclerosis. No bone lesions. IMPRESSION: Tube and catheter positions as described without pneumothorax. Areas of patchy infiltrate in each lower lung region are new compared to 1 day prior and may represent atelectasis or early changes of developing pneumonia. And ill-defined area of opacity  in the right upper lobe is stable. There is atelectatic change in the right mid lung and right base regions. Stable cardiac prominence with postoperative changes. Aortic Atherosclerosis (ICD10-I70.0). Electronically Signed   By: Lowella Grip III M.D.   On: 01/06/2021 07:58   DG Chest Port 1 View  Result Date: 01/05/2021 CLINICAL DATA:  Post CABG. EXAM: PORTABLE CHEST 1 VIEW COMPARISON:  01/03/2021 FINDINGS: Interval postoperative changes status post CABG. Endotracheal tube terminates 2.9 cm above the carina. Enteric tube is coiled within the gastric body/fundus. Right IJ Swan-Ganz catheter terminates at the level of the proximal pulmonary outflow tract. Left chest tube and mediastinal drain. Sternotomy wires and mediastinal clips. Heart size is normal. Streaky opacities within the right lung, likely atelectasis. Small right and trace left pleural effusions. No pneumothorax identified. IMPRESSION: 1. Interval postoperative changes status post CABG with support apparatus, as above. 2. Small right and trace left pleural effusions. No pneumothorax. Electronically Signed   By: Davina Poke D.O.   On: 01/05/2021 13:55   ECHO INTRAOPERATIVE TEE  Result Date: 01/05/2021  *INTRAOPERATIVE TRANSESOPHAGEAL REPORT *  Patient Name:   Rodney Strickland Date of Exam: 01/05/2021 Medical Rec #:  [id number]        Height:       66.0 in Accession #:    [id number]       Weight:       160.0 lb Date of Birth:  1966/08/23        BSA:          1.82 m Patient Age:    63 years         BP:           123/89 mmHg Patient Gender: M                HR:           46 bpm. Exam Location:  Anesthesiology Transesophogeal exam was perform intraoperatively during surgical procedure. Patient was closely monitored under general anesthesia during the entirety of examination. Indications:     CAD Native Vessel i25.0 Sonographer:     Raquel Sarna Senior RDCS Performing Phys: [id number] HQIONGE Z ATKINS Diagnosing Phys: Roderic Palau MD Complications:  No known complications during this procedure. POST-OP IMPRESSIONS Overall, there were no significant changes from pre-bypass. PRE-OP FINDINGS  Left Ventricle: The left ventricle has normal systolic function, with an ejection fraction of 60-65%. The cavity size was normal. There is moderately increased left ventricular wall thickness. Right Ventricle: The right ventricle has normal systolic function. The cavity was mildly enlarged. There is no increase in right ventricular wall thickness. Left Atrium: Left atrial size was not assessed. The left atrial appendage is well visualized and there is no evidence of thrombus present. Right Atrium: Right atrial size was not assessed. Interatrial Septum: No atrial level shunt  detected by color flow Doppler. Pericardium: There is no evidence of pericardial effusion. Mitral Valve: The mitral valve is normal in structure. No thickening of the mitral valve leaflet. No calcification of the mitral valve leaflet. Mitral valve regurgitation is mild by color flow Doppler. The MR jet is centrally-directed. Tricuspid Valve: The tricuspid valve was normal in structure. Tricuspid valve regurgitation is trivial by color flow Doppler. Aortic Valve: The aortic valve is normal in structure. Aortic valve regurgitation was not visualized by color flow Doppler. There is no stenosis of the aortic valve. Pulmonic Valve: The pulmonic valve was normal in structure. Pulmonic valve regurgitation is trivial by color flow Doppler.  Roderic Palau MD Electronically signed by Roderic Palau MD Signature Date/Time: 01/05/2021/3:34:13 PM    Final     Cardiac Studies      Patient Profile     55 y.o. male s/p CABG   Assessment & Plan    1.  CAD - s/p CABG,  Slightly tachycardic and hypertensive Will give metoprolol 25 bid  2. Post op pericarditis:   Has a friction rub,  ECG shows diffuse ST elevation. The ecg computer interpretation suggest an anterior STEMI but he does not have any  signs or symptoms of a STEMI.   I think these ECG changes are just due to pericarditis.   We will continue to follow him closely   3.   HTN:  Starting metoprolol       For questions or updates, please contact [place] Please consult [email or web address] for contact info under        Signed, Mertie Moores, MD  01/06/2021, 8:58 AM

## 2021-01-06 NOTE — Progress Notes (Signed)
EKG CRITICAL VALUE     12 lead EKG performed.  Critical value noted.  Dessa Phi, RN notified.   Alto Denver, CCT 01/06/2021 8:01 AM

## 2021-01-06 NOTE — Plan of Care (Signed)
Problem: Education: Goal: Knowledge of General Education information will improve Description: Including pain rating scale, medication(s)/side effects and non-pharmacologic comfort measures Outcome: Progressing   Problem: Health Behavior/Discharge Planning: Goal: Ability to manage health-related needs will improve Outcome: Progressing   Problem: Clinical Measurements: Goal: Ability to maintain clinical measurements within normal limits will improve Outcome: Progressing Goal: Will remain free from infection Outcome: Progressing Goal: Diagnostic test results will improve Outcome: Progressing Goal: Respiratory complications will improve Outcome: Completed/Met

## 2021-01-06 NOTE — Discharge Summary (Signed)
[address]       [place] [address]             [phone]    Physician Discharge Summary  Patient ID: Rodney Strickland MRN: [id number] DOB/AGE: [birth year] 55 y.o.  Admit date: 01/05/2021 Discharge date: 01/10/2021  Admission Diagnoses:  Patient Active Problem List   Diagnosis Date Noted  . S/P CABG x 4 01/05/2021  . Abnormal cardiac CT angiography 12/29/2020  . Unstable angina (HCC) 12/08/2020  . Right shoulder pain 11/21/2016    Discharge Diagnoses:   Coronary artery disease Unstable angina pectoris S/P CABG x 4 Chronic renal insufficiency Hypertension Dyslipidemia Expected acute blood loss anemia                          Discharged Condition: good  History of Present Illness:  [name] is a 55 yo male with history of HTN and Hypercholesterolemia.  He also has a history of lung injury on both sides from the 1980s.  He had been in his usual state of health until he recently developed fatigue.  He would also experience vague chest discomfort with exertion.  He underwent a coronary CT scan which was positive for CAD.  Subsequently he was set up for a cardiac catheterization which revealed a normal EF and severe 3 vessel CAD.  It was felt the patient would benefit from coronary bypass grafting and he was referred to [facility] for evaluation.  He was evaluated by Dr. Vickey Sages on 01/02/2021 at which time it was felt he would best be treated by coronary bypass.  The risks and benefits of the procedure were explained to the patient and he was agreeable to proceed.   Hospital Course:  [name] presented to [facility] on 01/04/2021.  He was taken to the operating room and underwent CABG x 4 utilizing LIMA to LAD, sequential radial artery to OM 1 and OM 2, and RIMA to PDA.  He also underwent open harvest of his left radial artery.  He tolerated the procedure without difficulty and was taken to the SICU in stable  condition. He was extubated the evening of surgery.  During his stay in the SICU the patient was weaned off NTG and transitioned to Isordil for his radial artery.  He developed ST changes which was felt to be consistent with Pericarditis.  He was on Colchicine for this.  The patient's arterial lines were removed without difficulty.  The patient continued to complain of pain despite pain medications.  Lidocaine patches were added for additional relief.  He was maintaining NSR and felt stable for transfer to the progressive care unit on 01/07/2021.  The patient was mildly tachycardic.  His lopressor was increased accordingly.  His pacing wires were removed without difficulty.  His chest tube output decreased and his tubes were removed on 01/09/2021.  Follow up CXR showed no pneumothorax and improvement in mild vascular congestion. He had poor appetite and was supplemented with Ensure.  His bowel was slow to resume function.  He was treated with Miralax and Reglan for this.  He had expected post operative blood loss anemia with Hgb at 8.2.  He was treated with iron supplementation.  The patient's creatinine continued to improve.  He was hyperkalemic at 5 and his supplementation was discontinued.  His bowel function recovered and he moved his bowels without difficulty.  He was moderately hypertensive with  SBP ~1107mmHg on the day of discharge. His metoprolol was increased to 37.5mg  po BID and his lisinopril was resumed.    Significant Diagnostic Studies: angiography:   1. Significant three-vessel coronary artery disease, including sequential 40%-60% mid/distal LAD stenosis as well as 90% LAD lesion near the apex, multifocal proximal-mid LCx disease of up to 90% with significant calcification and tortuosity, 90% proximal and 60% mid RCA lesions, and chronic total occlusion of RCA continuation with left-to-right collaterals filling the distal rPL branches. 2. Normal left ventricular systolic function and filling  pressure.  Treatments: surgery:    Coronary Artery Bypass Grafting x 4             Left Internal Mammary Artery to Distal Left Anterior Descending Coronary Artery, pedicled right internal mammary artery graft to Posterior Descending Coronary Artery; left radial artery graft to first and second obtuse Marginal Branches of Left Circumflex Coronary Artery Bilateral internal mammary artery harvesting Open left radial artery harvesting Completion graft surveillance with indocyanine green fluorescence imaging  Discharge Exam: Blood pressure (!) 165/65, pulse 98, temperature 98.5 F (36.9 C), temperature source Oral, resp. rate 19, height 5\' 6"  (1.676 m), weight 82.8 kg, SpO2 98 %.  General appearance: alert and cooperative Neurologic: intact Heart: regular rate and rhythm, S1, S2 normal, no murmur, click, rub or gallop Lungs: clear to auscultation bilaterally Abdomen: soft, non-tender; bowel sounds normal; no masses,  no organomegaly Extremities: extremities normal, atraumatic, no cyanosis or edema Wound: c/d/i   Discharge Medications:  The patient has been discharged on:   1.Beta Blocker:  Yes [  x ]                              No   [   ]                              If No, reason:  2.Ace Inhibitor/ARB: Yes [x   ]                                     No  [    ]                                     If No, reason:  3.Statin:   Yes [ x  ]                  No  [   ]                  If No, reason:  4.Ecasa:  Yes  [ x  ]                  No   [   ]                  If No, reason:    Discharge Instructions    Amb Referral to Cardiac Rehabilitation   Complete by: As directed    Diagnosis: CABG   CABG X ___: 4   After initial evaluation and assessments completed: Virtual Based Care may be provided alone or in conjunction with Phase 2 Cardiac Rehab based on patient barriers.: Yes  Allergies as of 01/10/2021   No Known Allergies     Medication List    STOP taking these  medications   hydrochlorothiazide 25 MG tablet Commonly known as: HYDRODIURIL   ibuprofen 200 MG tablet Commonly known as: ADVIL     TAKE these medications   allopurinol 100 MG tablet Commonly known as: ZYLOPRIM Take 100 mg by mouth daily.   aspirin EC 81 MG tablet Take 81 mg by mouth daily. Swallow whole.   cholecalciferol 25 MCG (1000 UNIT) tablet Commonly known as: VITAMIN D3 Take 1,000 Units by mouth daily.   colchicine 0.6 MG tablet Take 1 tablet (0.6 mg total) by mouth 2 (two) times daily.   furosemide 40 MG tablet Commonly known as: LASIX Take 1 tablet (40 mg total) by mouth daily.   isosorbide dinitrate 10 MG tablet Commonly known as: ISORDIL Take 1 tablet (10 mg total) by mouth 3 (three) times daily.   lisinopril 10 MG tablet Commonly known as: ZESTRIL Take 1 tablet (10 mg total) by mouth daily.   loratadine 10 MG tablet Commonly known as: CLARITIN Take 10 mg by mouth daily as needed for allergies.   metoprolol tartrate 25 MG tablet Commonly known as: LOPRESSOR Take 1.5 tablets (37.5 mg total) by mouth 2 (two) times daily. What changed: how much to take   multivitamin with minerals tablet Take 1 tablet by mouth daily.   nitroGLYCERIN 0.4 MG SL tablet Commonly known as: NITROSTAT Place 1 tablet (0.4 mg total) under the tongue every 5 (five) minutes as needed for chest pain.   oxyCODONE-acetaminophen 5-325 MG tablet Commonly known as: Percocet Take 1 tablet by mouth every 4 (four) hours as needed for severe pain.   polyethylene glycol 17 g packet Commonly known as: MIRALAX / GLYCOLAX Take 17 g by mouth daily as needed.   rosuvastatin 10 MG tablet Commonly known as: CRESTOR Take 10 mg by mouth at bedtime.   vitamin C 500 MG tablet Commonly known as: ASCORBIC ACID Take 500 mg by mouth daily.   zinc gluconate 50 MG tablet Take 50 mg by mouth daily.       Follow-up Information    [name], MD Follow up on 01/16/2021.   Specialty:  Cardiothoracic Surgery Why: Appointment is at 3:45pm Contact information: [address] [place] [address] [place] [address] [phone]        Tereso Newcomer T, [place]. Go on 01/27/2021.   Specialties: Cardiology, Physician Assistant Why: Your appointment is at 8:45am. Contact information: [address] [place] [address] [phone]        Nahser, Deloris Ping, MD. Go on 03/07/2021.   Specialty: Cardiology Why: Yopur appointment is at 3:40pm. Contact information: [address] [place] [address] [phone]               Signed: Leary Roca, PA-C  01/10/2021, 10:21 AM

## 2021-01-07 ENCOUNTER — Inpatient Hospital Stay (HOSPITAL_COMMUNITY): Payer: Federal, State, Local not specified - PPO

## 2021-01-07 DIAGNOSIS — I308 Other forms of acute pericarditis: Secondary | ICD-10-CM | POA: Diagnosis not present

## 2021-01-07 LAB — GLUCOSE, CAPILLARY
Glucose-Capillary: 110 mg/dL — ABNORMAL HIGH (ref 70–99)
Glucose-Capillary: 118 mg/dL — ABNORMAL HIGH (ref 70–99)
Glucose-Capillary: 124 mg/dL — ABNORMAL HIGH (ref 70–99)
Glucose-Capillary: 126 mg/dL — ABNORMAL HIGH (ref 70–99)
Glucose-Capillary: 142 mg/dL — ABNORMAL HIGH (ref 70–99)
Glucose-Capillary: 144 mg/dL — ABNORMAL HIGH (ref 70–99)

## 2021-01-07 LAB — BASIC METABOLIC PANEL
Anion gap: 6 (ref 5–15)
BUN: 40 mg/dL — ABNORMAL HIGH (ref 6–20)
CO2: 23 mmol/L (ref 22–32)
Calcium: 7.1 mg/dL — ABNORMAL LOW (ref 8.9–10.3)
Chloride: 102 mmol/L (ref 98–111)
Creatinine, Ser: 1.32 mg/dL — ABNORMAL HIGH (ref 0.61–1.24)
GFR, Estimated: >60 mL/min (ref >60)
Glucose, Bld: 117 mg/dL — ABNORMAL HIGH (ref 70–99)
Potassium: 4.1 mmol/L (ref 3.5–5.1)
Sodium: 131 mmol/L — ABNORMAL LOW (ref 135–145)

## 2021-01-07 LAB — TYPE AND SCREEN
ABO/RH(D): B POS
Antibody Screen: NEGATIVE
Unit division: 0

## 2021-01-07 LAB — BPAM RBC
Blood Product Expiration Date: 202203162359
ISSUE DATE / TIME: 202202250853
Unit Type and Rh: 7300

## 2021-01-07 LAB — CBC
HCT: 25.2 % — ABNORMAL LOW (ref 39.0–52.0)
Hemoglobin: 8.6 g/dL — ABNORMAL LOW (ref 13.0–17.0)
MCH: 30.3 pg (ref 26.0–34.0)
MCHC: 34.1 g/dL (ref 30.0–36.0)
MCV: 88.7 fL (ref 80.0–100.0)
Platelets: 128 10*3/uL — ABNORMAL LOW (ref 150–400)
RBC: 2.84 MIL/uL — ABNORMAL LOW (ref 4.22–5.81)
RDW: 13.5 % (ref 11.5–15.5)
WBC: 12.9 10*3/uL — ABNORMAL HIGH (ref 4.0–10.5)
nRBC: 0 % (ref 0.0–0.2)

## 2021-01-07 MED ORDER — SODIUM CHLORIDE 0.9% FLUSH: 3.0000 mL | INTRAVENOUS | Status: DC | PRN

## 2021-01-07 MED ORDER — LIDOCAINE 5 % EX PTCH
2.0000 | MEDICATED_PATCH | CUTANEOUS | Status: DC
  Administered 2021-01-07 – 2021-01-10 (×4): 2 via TRANSDERMAL
  Filled 2021-01-07 (×4): qty 2

## 2021-01-07 MED ORDER — ~~LOC~~ CARDIAC SURGERY, PATIENT & FAMILY EDUCATION: Freq: Once | Status: AC

## 2021-01-07 MED ORDER — POTASSIUM CHLORIDE CRYS ER 20 MEQ PO TBCR
40.0000 meq | EXTENDED_RELEASE_TABLET | Freq: Every day | ORAL | Status: DC
  Administered 2021-01-07 – 2021-01-08 (×2): 40 meq via ORAL
  Filled 2021-01-07 (×2): qty 2

## 2021-01-07 MED ORDER — SODIUM CHLORIDE 0.9% FLUSH
3.0000 mL | Freq: Two times a day (BID) | INTRAVENOUS | Status: DC
  Administered 2021-01-07 (×2): 3 mL via INTRAVENOUS

## 2021-01-07 MED ORDER — COLCHICINE 0.6 MG PO TABS
0.6000 mg | ORAL_TABLET | Freq: Two times a day (BID) | ORAL | Status: DC
  Administered 2021-01-07 – 2021-01-10 (×7): 0.6 mg via ORAL
  Filled 2021-01-07 (×7): qty 1

## 2021-01-07 MED ORDER — SODIUM CHLORIDE 0.9 % IV SOLN: 250.0000 mL | INTRAVENOUS | Status: DC | PRN

## 2021-01-07 MED ORDER — FUROSEMIDE 10 MG/ML IJ SOLN
40.0000 mg | Freq: Every day | INTRAMUSCULAR | Status: DC
  Administered 2021-01-07: 40 mg via INTRAVENOUS
  Filled 2021-01-07: qty 4

## 2021-01-07 NOTE — Progress Notes (Signed)
Pt arrived to 4e from 2h. Pt oriented to room and staff. Telemetry box applied and CCMD notified x2 verifiers. Vitals obtained. Wife at bedside. Will continue current plan of care.

## 2021-01-07 NOTE — Progress Notes (Signed)
Cardiology Progress Note  Patient ID: Rodney Strickland MRN: [id number] DOB: [birth year] Date of Encounter: 01/07/2021  Primary Cardiologist: No primary care provider on file.  Subjective   Chief Complaint: Chest pain.  HPI: Remains in the ICU.  Chest tube still in place.  Pain is improved with pain medications.  EKG yesterday consistent with pericarditis.  Seems to be better with pain medicines.  Vitals are stable.  ROS:  All other ROS reviewed and negative. Pertinent positives noted in the HPI.     Inpatient Medications  Scheduled Meds: . sodium chloride   Intravenous Once  . sodium chloride   Intravenous Once  . acetaminophen  1,000 mg Oral Q6H   Or  . acetaminophen (TYLENOL) oral liquid 160 mg/5 mL  1,000 mg Per Tube Q6H  . allopurinol  100 mg Oral Daily  . aspirin EC  325 mg Oral Daily   Or  . aspirin  324 mg Per Tube Daily  . bisacodyl  10 mg Oral Daily   Or  . bisacodyl  10 mg Rectal Daily  . chlorhexidine gluconate (MEDLINE KIT)  15 mL Mouth Rinse BID  . Chlorhexidine Gluconate Cloth  6 each Topical Daily  . colchicine  0.6 mg Oral BID  . [facility] Education   Does not apply Once  . diazepam  2 mg Oral Q8H  . docusate sodium  200 mg Oral Daily  . furosemide  40 mg Intravenous Daily  . insulin aspart  0-24 Units Subcutaneous Q4H  . isosorbide dinitrate  10 mg Oral TID  . metoprolol tartrate  25 mg Oral BID  . pantoprazole  40 mg Oral Daily  . potassium chloride  40 mEq Oral Daily  . rosuvastatin  10 mg Oral QHS  . sodium chloride flush  10-40 mL Intracatheter Q12H  . sodium chloride flush  3 mL Intravenous Q12H  . sodium chloride flush  3 mL Intravenous Q12H   Continuous Infusions: . sodium chloride Stopped (01/05/21 1946)  . sodium chloride    . sodium chloride 10 mL/hr at 01/05/21 1306  . sodium chloride    . cefUROXime (ZINACEF)  IV Stopped (01/06/21 2251)  . lactated ringers    . lactated ringers    . lactated ringers  Stopped (01/06/21 2221)  . nitroGLYCERIN Stopped (01/06/21 1348)   PRN Meds: sodium chloride, sodium chloride, lactated ringers, loratadine, metoprolol tartrate, midazolam, morphine injection, ondansetron (ZOFRAN) IV, oxyCODONE, sodium chloride flush, sodium chloride flush, sodium chloride flush, traMADol   Vital Signs   Vitals:   01/07/21 0337 01/07/21 0400 01/07/21 0500 01/07/21 0600  BP:  127/83 134/86 129/81  Pulse:  (!) 101 100 95  Resp:  (!) 28 (!) 32 (!) 28  Temp: 98.5 F (36.9 C)     TempSrc: Oral     SpO2:  94% 93% 92%  Weight:   78.8 kg   Height:        Intake/Output Summary (Last 24 hours) at 01/07/2021 0655 Last data filed at 01/07/2021 0600 Gross per 24 hour  Intake 2276.99 ml  Output 2010 ml  Net 266.99 ml   Last 3 Weights 01/07/2021 01/06/2021 01/05/2021  Weight (lbs) 173 lb 11.6 oz 171 lb 8.3 oz 160 lb  Weight (kg) 78.8 kg 77.8 kg 72.576 kg      Telemetry  Overnight telemetry shows sinus rhythm with sinus tachycardia heart rates in the low 100s, which I personally reviewed.   ECG  The most  recent ECG shows sinus rhythm 85, diffuse PR depressions with ST elevations in all leads, PR elevation noted in aVR consistent with pericarditis, which I personally reviewed.   Physical Exam   Vitals:   01/07/21 0337 01/07/21 0400 01/07/21 0500 01/07/21 0600  BP:  127/83 134/86 129/81  Pulse:  (!) 101 100 95  Resp:  (!) 28 (!) 32 (!) 28  Temp: 98.5 F (36.9 C)     TempSrc: Oral     SpO2:  94% 93% 92%  Weight:   78.8 kg   Height:         Intake/Output Summary (Last 24 hours) at 01/07/2021 0655 Last data filed at 01/07/2021 0600 Gross per 24 hour  Intake 2276.99 ml  Output 2010 ml  Net 266.99 ml    Last 3 Weights 01/07/2021 01/06/2021 01/05/2021  Weight (lbs) 173 lb 11.6 oz 171 lb 8.3 oz 160 lb  Weight (kg) 78.8 kg 77.8 kg 72.576 kg    Body mass index is 28.04 kg/m.   General: Well nourished, well developed, in no acute distress Head: Atraumatic, normal size   Eyes: PEERLA, EOMI  Neck: Supple, no JVD Endocrine: No thryomegaly Cardiac: Normal S1, S2; RRR; pericardial rub noted Lungs: Diminished breath sounds bilaterally Abd: Soft, nontender, no hepatomegaly  Ext: No edema, pulses 2+ Musculoskeletal: No deformities, BUE and BLE strength normal and equal Skin: Midline sternotomy site clean and dry with dressing in place Neuro: Alert and oriented to person, place, time, and situation, CNII-XII grossly intact, no focal deficits  Psych: Normal mood and affect   Labs  High Sensitivity Troponin:  No results for input(s): TROPONINIHS in the last 720 hours.   Cardiac EnzymesNo results for input(s): TROPONINI in the last 168 hours. No results for input(s): TROPIPOC in the last 168 hours.  Chemistry Recent Labs  Lab 01/03/21 1200 01/05/21 0800 01/06/21 0348 01/06/21 2049 01/07/21 0320  NA 133*   < > 134* 132* 131*  K 4.6   < > 4.8 4.1 4.1  CL 108   < > 104 102 102  CO2 18*   < > 20* 23 23  GLUCOSE 104*   < > 154* 137* 117*  BUN 38*   < > 40* 42* 40*  CREATININE 1.37*   < > 1.75* 1.51* 1.32*  CALCIUM 8.8*   < > 7.3* 7.0* 7.1*  PROT 4.6*  --   --   --   --   ALBUMIN 1.8*  --   --   --   --   AST 18  --   --   --   --   ALT 13  --   --   --   --   ALKPHOS 45  --   --   --   --   BILITOT 0.7  --   --   --   --   GFRNONAA >60   < > 45* 54* >60  ANIONGAP 7   < > 10 7 6    < > = values in this interval not displayed.    Hematology Recent Labs  Lab 01/06/21 0348 01/06/21 2049 01/07/21 0320  WBC 7.9 10.9* 12.9*  RBC 2.57* 2.68* 2.84*  HGB 8.1* 8.4* 8.6*  HCT 22.9* 23.5* 25.2*  MCV 89.1 87.7 88.7  MCH 31.5 31.3 30.3  MCHC 35.4 35.7 34.1  RDW 12.3 13.8 13.5  PLT 120* 124* 128*   BNPNo results for input(s): BNP, PROBNP in the last 168 hours.  DDimer No results for input(s): DDIMER in the last 168 hours.   Radiology  DG Chest Port 1 View  Result Date: 01/06/2021 CLINICAL DATA:  Chest pain.  Status postextubation EXAM: PORTABLE CHEST  1 VIEW COMPARISON:  January 05, 2021 FINDINGS: Endotracheal tube and nasogastric tube have been removed. Swan-Ganz catheter tip is in the main pulmonary outflow tract, unchanged. Left chest tube as well as mediastinal drains are unchanged in position. Temporary pacemaker wires are attached to the right heart. No pneumothorax. There is atelectatic change in portions of the right mid lung and right base regions. There is ill-defined patchy opacity in the left lower lobe, new from 1 day prior. Ill-defined airspace opacity in the right upper lobe is stable. There is new ill-defined airspace opacity in the right base. Heart is mildly enlarged with pulmonary vascularity normal. No adenopathy. There is aortic atherosclerosis. No bone lesions. IMPRESSION: Tube and catheter positions as described without pneumothorax. Areas of patchy infiltrate in each lower lung region are new compared to 1 day prior and may represent atelectasis or early changes of developing pneumonia. And ill-defined area of opacity in the right upper lobe is stable. There is atelectatic change in the right mid lung and right base regions. Stable cardiac prominence with postoperative changes. Aortic Atherosclerosis (ICD10-I70.0). Electronically Signed   By: Lowella Grip III M.D.   On: 01/06/2021 07:58   DG Chest Port 1 View  Result Date: 01/05/2021 CLINICAL DATA:  Post CABG. EXAM: PORTABLE CHEST 1 VIEW COMPARISON:  01/03/2021 FINDINGS: Interval postoperative changes status post CABG. Endotracheal tube terminates 2.9 cm above the carina. Enteric tube is coiled within the gastric body/fundus. Right IJ Swan-Ganz catheter terminates at the level of the proximal pulmonary outflow tract. Left chest tube and mediastinal drain. Sternotomy wires and mediastinal clips. Heart size is normal. Streaky opacities within the right lung, likely atelectasis. Small right and trace left pleural effusions. No pneumothorax identified. IMPRESSION: 1. Interval  postoperative changes status post CABG with support apparatus, as above. 2. Small right and trace left pleural effusions. No pneumothorax. Electronically Signed   By: Davina Poke D.O.   On: 01/05/2021 13:55   ECHO INTRAOPERATIVE TEE  Result Date: 01/05/2021  *INTRAOPERATIVE TRANSESOPHAGEAL REPORT *  Patient Name:   WONG STEADHAM Date of Exam: 01/05/2021 Medical Rec #:  [id number]        Height:       66.0 in Accession #:    [id number]       Weight:       160.0 lb Date of Birth:  1966-03-05        BSA:          1.82 m Patient Age:    60 years         BP:           123/89 mmHg Patient Gender: M                HR:           46 bpm. Exam Location:  Anesthesiology Transesophogeal exam was perform intraoperatively during surgical procedure. Patient was closely monitored under general anesthesia during the entirety of examination. Indications:     CAD Native Vessel i25.0 Sonographer:     Raquel Sarna Senior RDCS Performing Phys: [id number] FGHWEXH Z ATKINS Diagnosing Phys: Roderic Palau MD Complications: No known complications during this procedure. POST-OP IMPRESSIONS Overall, there were no significant changes from pre-bypass. PRE-OP FINDINGS  Left Ventricle: The left ventricle has normal systolic  function, with an ejection fraction of 60-65%. The cavity size was normal. There is moderately increased left ventricular wall thickness. Right Ventricle: The right ventricle has normal systolic function. The cavity was mildly enlarged. There is no increase in right ventricular wall thickness. Left Atrium: Left atrial size was not assessed. The left atrial appendage is well visualized and there is no evidence of thrombus present. Right Atrium: Right atrial size was not assessed. Interatrial Septum: No atrial level shunt detected by color flow Doppler. Pericardium: There is no evidence of pericardial effusion. Mitral Valve: The mitral valve is normal in structure. No thickening of the mitral valve leaflet. No calcification  of the mitral valve leaflet. Mitral valve regurgitation is mild by color flow Doppler. The MR jet is centrally-directed. Tricuspid Valve: The tricuspid valve was normal in structure. Tricuspid valve regurgitation is trivial by color flow Doppler. Aortic Valve: The aortic valve is normal in structure. Aortic valve regurgitation was not visualized by color flow Doppler. There is no stenosis of the aortic valve. Pulmonic Valve: The pulmonic valve was normal in structure. Pulmonic valve regurgitation is trivial by color flow Doppler.  Roderic Palau MD Electronically signed by Roderic Palau MD Signature Date/Time: 01/05/2021/3:34:13 PM    Final     Cardiac Studies  TTE 12/29/2020 1. Left ventricular ejection fraction, by estimation, is 55 to 60%. The  left ventricle has normal function. The left ventricle has no regional  wall motion abnormalities. Left ventricular diastolic parameters were  normal.  2. Right ventricular systolic function is normal. The right ventricular  size is normal. There is normal pulmonary artery systolic pressure.  3. The mitral valve is normal in structure. Trivial mitral valve  regurgitation. No evidence of mitral stenosis.  4. The aortic valve has an indeterminant number of cusps. Aortic valve  regurgitation is not visualized. No aortic stenosis is present.  5. The inferior vena cava is normal in size with greater than 50%  respiratory variability, suggesting right atrial pressure of 3 mmHg.   Patient Profile  Rodney Strickland is a 55 y.o. male with hypertension, two-vessel CAD status post CABG who was admitted on 01/05/2021 for bypass surgery.  Cardiology was consulted on 01/06/2021 for pericarditis.  Assessment & Plan   1.  CAD status post CABG -Doing well postop.  Management per surgery.  2.  Postop pericarditis -No STEMI.  EKG and symptoms more consistent with pericarditis.  I have increased his colchicine to 0.6 mg twice daily.  I would continue this.   We will continue with postop pain management.  This should improve.  He is on aspirin as well.  If symptoms persist would continue high-dose aspirin if this would be okay postoperatively.  He may do fine with colchicine and low-dose aspirin.   For questions or updates, please contact Will Please consult [email or web address] for contact info under    Time Spent with Patient: I have spent a total of 25 minutes with patient reviewing hospital notes, telemetry, EKGs, labs and examining the patient as well as establishing an assessment and plan that was discussed with the patient.  > 50% of time was spent in direct patient care.    Signed, Addison Naegeli. Audie Box, [place]  01/07/2021 6:55 AM

## 2021-01-07 NOTE — Progress Notes (Signed)
CARDIAC REHAB PHASE I   PRE:  Rate/Rhythm: 82 SR    BP: sitting 113/83    SaO2: 92 2L  MODE:  Ambulation: 370 ft   POST:  Rate/Rhythm: 92 SR    BP: sitting 111/88     SaO2: 92 2L  Pt very sleepy but able to follow commands. Max assist to get to EOB due to hugging pillow tightly. Stood with min assist and ambulated slowly with EVA, 2L. Tolerated fairly well. To w/c for tx. Wife present. Encouraged another walk and IS (500 mL currently). [id number]   Harriet Masson CES, ACSM 01/07/2021 2:29 PM

## 2021-01-07 NOTE — Progress Notes (Signed)
Mobility Specialist: Progress Note   01/07/21 1705  Mobility  Activity Ambulated in hall  Level of Assistance Minimal assist, patient does 75% or more  Assistive Device Four wheel walker (EVA)  Distance Ambulated (ft) 470 ft  Mobility Response Tolerated well  Mobility performed by Mobility specialist  $Mobility charge 1 Mobility   Pre-Mobility: 78 HR, 97% SpO2 During Mobility: 80 HR, 90% SpO2 Post-Mobility: 85 HR, 120/80 BP, 93% SpO2  Pt ambulated on 2 L/min [place]. Pt slightly SOB during ambulation. Coached pt through purse lipped breathing, sats as seen above. Pt desaturated to 88% after returning to room, resolved quickly. Pt back to bed after walk.   [facility] Betina Puckett Mobility Specialist Mobility Specialist Phone: [phone]

## 2021-01-07 NOTE — Progress Notes (Signed)
      [address]       [place] [address]             [phone]                 2 Days Post-Op Procedure(s) (LRB): CORONARY ARTERY BYPASS GRAFTING (CABG) TIMES FOUR, USING BILATERAL INTERNAL MAMMARY ARTERIES AND LEFT RADIAL ARTERY (N/A) RADIAL ARTERY HARVEST (Left) TRANSESOPHAGEAL ECHOCARDIOGRAM (TEE) (N/A) INDOCYANINE GREEN FLUORESCENCE IMAGING (ICG) (N/A)   Events: Continue to complain of pain No other events _______________________________________________________________ Vitals: BP 129/81   Pulse 95   Temp 98.5 F (36.9 C) (Oral)   Resp (!) 28   Ht 5\' 6"  (1.676 m)   Wt 78.8 kg   SpO2 92%   BMI 28.04 kg/m   - Neuro: alert NAD  - Cardiovascular: sinus  Drips: none.   PAP: (19-27)/(6-12) 27/12  - Pulm: EWOB, SS CT output    ABG    Component Value Date/Time   PHART 7.323 (L) 01/05/2021 1834   PCO2ART 43.0 01/05/2021 1834   PO2ART 79 (L) 01/05/2021 1834   HCO3 22.2 01/05/2021 1834   TCO2 23 01/05/2021 1834   ACIDBASEDEF 4.0 (H) 01/05/2021 1834   O2SAT 94.0 01/05/2021 1834    - Abd: ND - Extremity: warm  .Intake/Output      02/25 0701 02/26 0700   P.O. 840   I.V. (mL/kg) 569.5 (7.2)   Blood 390   IV Piggyback 477.5   Total Intake(mL/kg) 2277 (28.9)   Urine (mL/kg/hr) 1250 (0.7)   Chest Tube 760   Total Output 2010   Net +267          _______________________________________________________________ Labs: CBC Latest Ref Rng & Units 01/07/2021 01/06/2021 01/06/2021  WBC 4.0 - 10.5 K/uL 12.9(H) 10.9(H) 7.9  Hemoglobin 13.0 - 17.0 g/dL 01/08/2021) 7.4(Q) 8.1(L)  Hematocrit 39.0 - 52.0 % 25.2(L) 23.5(L) 22.9(L)  Platelets 150 - 400 K/uL 128(L) 124(L) 120(L)   CMP Latest Ref Rng & Units 01/07/2021 01/06/2021 01/06/2021  Glucose 70 - 99 mg/dL 01/08/2021) 638(V) 564(P)  BUN 6 - 20 mg/dL 329(J) 18(A) 41(Y)  Creatinine 0.61 - 1.24 mg/dL 60(Y) 3.01(S) 0.10(X)  Sodium 135 - 145 mmol/L 131(L) 132(L) 134(L)  Potassium 3.5 - 5.1 mmol/L 4.1 4.1 4.8   Chloride 98 - 111 mmol/L 102 102 104  CO2 22 - 32 mmol/L 23 23 20(L)  Calcium 8.9 - 10.3 mg/dL 7.1(L) 7.0(L) 7.3(L)  Total Protein 6.5 - 8.1 g/dL - - -  Total Bilirubin 0.3 - 1.2 mg/dL - - -  Alkaline Phos 38 - 126 U/L - - -  AST 15 - 41 U/L - - -  ALT 0 - 44 U/L - - -    CXR: PV congestion  _______________________________________________________________  Assessment and Plan: POD 2 s/p CABG  Neuro: on toradol for pain.  Will add lidocaine patches CV: on A/S/BB, and colchicine for presumed pericarditis Pulm: will keep CT for now Renal: will diurese today GI: on diet Heme: stable ID: afebrile Endo: SSI Dispo: floor today   3.23(F 01/07/2021 6:54 AM

## 2021-01-07 NOTE — Significant Event (Signed)
Patient transferred safely to 4E25, transported via wheelchair with RN and patient's spouse. All personal belongings taken to new room by his wife. Report given to receiving RN in 4E; patient settled in bed, chest tubes to wall suction; receiving staff in room prior to RN leaving.

## 2021-01-08 LAB — BASIC METABOLIC PANEL
Anion gap: 4 — ABNORMAL LOW (ref 5–15)
BUN: 49 mg/dL — ABNORMAL HIGH (ref 6–20)
CO2: 25 mmol/L (ref 22–32)
Calcium: 7.1 mg/dL — ABNORMAL LOW (ref 8.9–10.3)
Chloride: 100 mmol/L (ref 98–111)
Creatinine, Ser: 1.46 mg/dL — ABNORMAL HIGH (ref 0.61–1.24)
GFR, Estimated: 56 mL/min — ABNORMAL LOW (ref >60)
Glucose, Bld: 129 mg/dL — ABNORMAL HIGH (ref 70–99)
Potassium: 4.8 mmol/L (ref 3.5–5.1)
Sodium: 129 mmol/L — ABNORMAL LOW (ref 135–145)

## 2021-01-08 LAB — CBC
HCT: 23.7 % — ABNORMAL LOW (ref 39.0–52.0)
Hemoglobin: 8.2 g/dL — ABNORMAL LOW (ref 13.0–17.0)
MCH: 30.9 pg (ref 26.0–34.0)
MCHC: 34.6 g/dL (ref 30.0–36.0)
MCV: 89.4 fL (ref 80.0–100.0)
Platelets: 140 10*3/uL — ABNORMAL LOW (ref 150–400)
RBC: 2.65 MIL/uL — ABNORMAL LOW (ref 4.22–5.81)
RDW: 13.4 % (ref 11.5–15.5)
WBC: 12.1 10*3/uL — ABNORMAL HIGH (ref 4.0–10.5)
nRBC: 0 % (ref 0.0–0.2)

## 2021-01-08 LAB — GLUCOSE, CAPILLARY
Glucose-Capillary: 125 mg/dL — ABNORMAL HIGH (ref 70–99)
Glucose-Capillary: 124 mg/dL — ABNORMAL HIGH (ref 70–99)

## 2021-01-08 MED ORDER — ENSURE ENLIVE PO LIQD
237.0000 mL | Freq: Three times a day (TID) | ORAL | Status: DC
  Administered 2021-01-08 – 2021-01-10 (×6): 237 mL via ORAL

## 2021-01-08 MED ORDER — FUROSEMIDE 40 MG PO TABS
40.0000 mg | ORAL_TABLET | Freq: Every day | ORAL | Status: DC
  Administered 2021-01-08 – 2021-01-10 (×3): 40 mg via ORAL
  Filled 2021-01-08 (×3): qty 1

## 2021-01-08 MED ORDER — METOCLOPRAMIDE HCL 5 MG/ML IJ SOLN
10.0000 mg | Freq: Four times a day (QID) | INTRAMUSCULAR | Status: AC
  Administered 2021-01-08 – 2021-01-09 (×4): 10 mg via INTRAVENOUS
  Filled 2021-01-08 (×4): qty 2

## 2021-01-08 MED ORDER — POLYETHYLENE GLYCOL 3350 17 G PO PACK
17.0000 g | PACK | Freq: Every day | ORAL | Status: DC
  Administered 2021-01-08: 17 g via ORAL
  Filled 2021-01-08 (×3): qty 1

## 2021-01-08 MED ORDER — FERROUS SULFATE 325 (65 FE) MG PO TABS
325.0000 mg | ORAL_TABLET | Freq: Three times a day (TID) | ORAL | Status: DC
  Administered 2021-01-08 (×2): 325 mg via ORAL
  Filled 2021-01-08 (×2): qty 1

## 2021-01-08 NOTE — Plan of Care (Signed)
  Problem: Education: Goal: Knowledge of General Education information will improve Description: Including pain rating scale, medication(s)/side effects and non-pharmacologic comfort measures Outcome: Progressing   Problem: Health Behavior/Discharge Planning: Goal: Ability to manage health-related needs will improve Outcome: Progressing   Problem: Clinical Measurements: Goal: Ability to maintain clinical measurements within normal limits will improve Outcome: Progressing Goal: Will remain free from infection Outcome: Progressing Goal: Diagnostic test results will improve Outcome: Progressing Goal: Cardiovascular complication will be avoided Outcome: Progressing   Problem: Activity: Goal: Risk for activity intolerance will decrease Outcome: Progressing   Problem: Nutrition: Goal: Adequate nutrition will be maintained Outcome: Progressing   Problem: Coping: Goal: Level of anxiety will decrease Outcome: Progressing   Problem: Elimination: Goal: Will not experience complications related to bowel motility Outcome: Progressing Goal: Will not experience complications related to urinary retention Outcome: Progressing   Problem: Pain Managment: Goal: General experience of comfort will improve Outcome: Progressing   Problem: Safety: Goal: Ability to remain free from injury will improve Outcome: Progressing   Problem: Skin Integrity: Goal: Risk for impaired skin integrity will decrease Outcome: Progressing   Problem: Education: Goal: Will demonstrate proper wound care and an understanding of methods to prevent future damage Outcome: Progressing Goal: Knowledge of disease or condition will improve Outcome: Progressing Goal: Knowledge of the prescribed therapeutic regimen will improve Outcome: Progressing Goal: Individualized Educational Video(s) Outcome: Progressing   Problem: Activity: Goal: Risk for activity intolerance will decrease Outcome: Progressing    Problem: Cardiac: Goal: Will achieve and/or maintain hemodynamic stability Outcome: Progressing   Problem: Clinical Measurements: Goal: Postoperative complications will be avoided or minimized Outcome: Progressing   Problem: Respiratory: Goal: Respiratory status will improve Outcome: Progressing   Problem: Skin Integrity: Goal: Wound healing without signs and symptoms of infection Outcome: Progressing Goal: Risk for impaired skin integrity will decrease Outcome: Progressing   Problem: Urinary Elimination: Goal: Ability to achieve and maintain adequate renal perfusion and functioning will improve Outcome: Progressing

## 2021-01-08 NOTE — Progress Notes (Signed)
Mobility Specialist: Progress Note   01/08/21 1245  Mobility  Activity Ambulated in hall  Level of Assistance Minimal assist, patient does 75% or more  Assistive Device Four wheel walker  Distance Ambulated (ft) 470 ft  Mobility Response Tolerated fair  Mobility performed by Mobility specialist  $Mobility charge 1 Mobility   Pre-Mobility on 2 L/min [place]: 85 HR, 93% SpO2 During Mobility on 2 L/min [place]: 84-87% SpO2 Post-Mobility on 3 L/min [place]: 91 HR, 141/90 BP, 95% SpO2  Pt c/o feeling slightly SOB during ambulation but said he was fine to continue. Pt desat to 83-87% during walk according to pulse oximeter so [place] flow was increased to 3 L/min [place] during ambulation. Pt said he felt fine during walk. Pt struggles to take deep breathes due to chest tube discomfort. Encouraged IS use. Pt back to bed after walk on 2 L/min  in room with sats maintaining 96%.   [facility] Keshona Kartes Mobility Specialist Mobility Specialist Phone: [phone]

## 2021-01-08 NOTE — Progress Notes (Addendum)
      [address]       [place] [address]             [phone]      3 Days Post-Op Procedure(s) (LRB): CORONARY ARTERY BYPASS GRAFTING (CABG) TIMES FOUR, USING BILATERAL INTERNAL MAMMARY ARTERIES AND LEFT RADIAL ARTERY (N/A) RADIAL ARTERY HARVEST (Left) TRANSESOPHAGEAL ECHOCARDIOGRAM (TEE) (N/A) INDOCYANINE GREEN FLUORESCENCE IMAGING (ICG) (N/A)   Subjective:  No new complaints.  Continues to complain of pain.  Not eating very much, wife is at bedside trying to get him to eat.  No BM  + ambulation  Objective: Vital signs in last 24 hours: Temp:  [97.6 F (36.4 C)-97.8 F (36.6 C)] 97.6 F (36.4 C) (02/27 0720) Pulse Rate:  [73-94] 92 (02/27 0720) Cardiac Rhythm: Normal sinus rhythm (02/27 0700) Resp:  [9-23] 20 (02/27 0720) BP: (101-132)/(65-91) 122/79 (02/27 0720) SpO2:  [88 %-97 %] 97 % (02/27 0720) Weight:  [80.8 kg] 80.8 kg (02/27 0435)  Intake/Output from previous day: 02/26 0701 - 02/27 0700 In: 340 [P.O.:240; IV Piggyback:100] Out: 1350 [Urine:1040; Chest Tube:310]  General appearance: alert, cooperative and no distress Heart: regular rate and rhythm and tachy Lungs: clear to auscultation bilaterally Abdomen: + distended, non-tender, minimal BS Extremities: edema trace Wound: clean and dry, staples in place on left radial artery site, 0 parasthesias  Lab Results: Recent Labs    01/07/21 0320 01/08/21 0207  WBC 12.9* 12.1*  HGB 8.6* 8.2*  HCT 25.2* 23.7*  PLT 128* 140*   BMET:  Recent Labs    01/07/21 0320 01/08/21 0207  NA 131* 129*  K 4.1 4.8  CL 102 100  CO2 23 25  GLUCOSE 117* 129*  BUN 40* 49*  CREATININE 1.32* 1.46*  CALCIUM 7.1* 7.1*    PT/INR:  Recent Labs    01/05/21 1330  LABPROT 16.9*  INR 1.4*   ABG    Component Value Date/Time   PHART 7.323 (L) 01/05/2021 1834   HCO3 22.2 01/05/2021 1834   TCO2 23 01/05/2021 1834   ACIDBASEDEF 4.0 (H) 01/05/2021 1834   O2SAT 94.0 01/05/2021 1834   CBG (last 3)   Recent Labs    01/07/21 2343 01/08/21 0419 01/08/21 0742  GLUCAP 124* 125* 124*    Assessment/Plan: S/P Procedure(s) (LRB): CORONARY ARTERY BYPASS GRAFTING (CABG) TIMES FOUR, USING BILATERAL INTERNAL MAMMARY ARTERIES AND LEFT RADIAL ARTERY (N/A) RADIAL ARTERY HARVEST (Left) TRANSESOPHAGEAL ECHOCARDIOGRAM (TEE) (N/A) INDOCYANINE GREEN FLUORESCENCE IMAGING (ICG) (N/A)  1. CV- Sins Tach mild, BP stable- continue Lopressor 25 mg BID, Isordil for radial artery graft 2. Pulm- CT output remains elevated (pleurovac level currently at 250), wean oxygen as tolerated, can hopefully d/c chest tubes soon 3. Renal- creatinine improved, weight is elevated if accurate- continue lasix, potassium for now 4. GI- poor oral intake, minimal bowel activity, could be developing early ileus, instructed wife to not force patient to eat, added reglan x 4 doses, Miralax daily, encouraged ambulation 5. Expected post operative blood loss anemia, moderate Hgb at 8.2 which is relatively stable, will add iron daily 6. CBGs- not a diabetic will d/c SSIP 7. Dispo- patient stable, watch for ileus, bowel regimen started, continue CTs for now with increased output, will d/c EPW today, patient making slow progress   LOS: 3 days    Lowella Dandy, PA-C 01/08/2021   Agree with above Needs aggressive ambulation Will keep CT for now  [employer]

## 2021-01-08 NOTE — Progress Notes (Signed)
Epicardial pacing wires removed, per order. Wire ends intact. Vitals being monitored per protocol. Bedrest x1 hour.

## 2021-01-09 ENCOUNTER — Inpatient Hospital Stay (HOSPITAL_COMMUNITY): Payer: Federal, State, Local not specified - PPO

## 2021-01-09 LAB — CBC
HCT: 24.1 % — ABNORMAL LOW (ref 39.0–52.0)
Hemoglobin: 8.6 g/dL — ABNORMAL LOW (ref 13.0–17.0)
MCH: 31.2 pg (ref 26.0–34.0)
MCHC: 35.7 g/dL (ref 30.0–36.0)
MCV: 87.3 fL (ref 80.0–100.0)
Platelets: 187 10*3/uL (ref 150–400)
RBC: 2.76 MIL/uL — ABNORMAL LOW (ref 4.22–5.81)
RDW: 13.3 % (ref 11.5–15.5)
WBC: 10.9 10*3/uL — ABNORMAL HIGH (ref 4.0–10.5)
nRBC: 0 % (ref 0.0–0.2)

## 2021-01-09 LAB — BASIC METABOLIC PANEL
Anion gap: 7 (ref 5–15)
BUN: 44 mg/dL — ABNORMAL HIGH (ref 6–20)
CO2: 24 mmol/L (ref 22–32)
Calcium: 7.5 mg/dL — ABNORMAL LOW (ref 8.9–10.3)
Chloride: 101 mmol/L (ref 98–111)
Creatinine, Ser: 1.31 mg/dL — ABNORMAL HIGH (ref 0.61–1.24)
GFR, Estimated: >60 mL/min (ref >60)
Glucose, Bld: 122 mg/dL — ABNORMAL HIGH (ref 70–99)
Potassium: 5.1 mmol/L (ref 3.5–5.1)
Sodium: 132 mmol/L — ABNORMAL LOW (ref 135–145)

## 2021-01-09 MED ORDER — POLYETHYLENE GLYCOL 3350 17 G PO PACK: 17.0000 g | PACK | Freq: Every day | ORAL | 0 refills | Status: DC | PRN

## 2021-01-09 MED ORDER — ACETAMINOPHEN 500 MG PO TABS: 1000.0000 mg | ORAL_TABLET | Freq: Four times a day (QID) | ORAL | 0 refills | Status: DC | PRN

## 2021-01-09 MED ORDER — FE FUMARATE-B12-VIT C-FA-IFC PO CAPS
1.0000 | ORAL_CAPSULE | Freq: Two times a day (BID) | ORAL | Status: DC
  Administered 2021-01-09 – 2021-01-10 (×3): 1 via ORAL
  Filled 2021-01-09 (×3): qty 1

## 2021-01-09 MED FILL — Heparin Sodium (Porcine) Inj 1000 Unit/ML: INTRAMUSCULAR | Qty: 10 | Status: AC

## 2021-01-09 MED FILL — Magnesium Sulfate Inj 50%: INTRAMUSCULAR | Qty: 10 | Status: AC

## 2021-01-09 MED FILL — Potassium Chloride Inj 2 mEq/ML: INTRAVENOUS | Qty: 40 | Status: AC

## 2021-01-09 MED FILL — Calcium Chloride Inj 10%: INTRAVENOUS | Qty: 10 | Status: AC

## 2021-01-09 MED FILL — Sodium Bicarbonate IV Soln 8.4%: INTRAVENOUS | Qty: 50 | Status: AC

## 2021-01-09 MED FILL — Mannitol IV Soln 20%: INTRAVENOUS | Qty: 500 | Status: AC

## 2021-01-09 MED FILL — Sodium Chloride IV Soln 0.9%: INTRAVENOUS | Qty: 3000 | Status: AC

## 2021-01-09 MED FILL — Heparin Sodium (Porcine) Inj 1000 Unit/ML: INTRAMUSCULAR | Qty: 30 | Status: AC

## 2021-01-09 MED FILL — Electrolyte-R (PH 7.4) Solution: INTRAVENOUS | Qty: 4000 | Status: AC

## 2021-01-09 NOTE — Progress Notes (Signed)
      [address]       [place] [address]             [phone]      4 Days Post-Op Procedure(s) (LRB): CORONARY ARTERY BYPASS GRAFTING (CABG) TIMES FOUR, USING BILATERAL INTERNAL MAMMARY ARTERIES AND LEFT RADIAL ARTERY (N/A) RADIAL ARTERY HARVEST (Left) TRANSESOPHAGEAL ECHOCARDIOGRAM (TEE) (N/A) INDOCYANINE GREEN FLUORESCENCE IMAGING (ICG) (N/A)   Subjective:  No new complaints.  Feeling a little better.  Having some nausea, but denies vomiting.  States he moved his bowels yesterday.  + ambulation  Objective: Vital signs in last 24 hours: Temp:  [97.6 F (36.4 C)-98.4 F (36.9 C)] 98.1 F (36.7 C) (02/28 0724) Pulse Rate:  [76-87] 86 (02/28 0724) Cardiac Rhythm: Normal sinus rhythm (02/27 2000) Resp:  [12-22] 17 (02/28 0724) BP: (97-134)/(63-89) 134/79 (02/28 0724) SpO2:  [93 %-99 %] 97 % (02/28 0724) Weight:  [78.5 kg] 78.5 kg (02/28 0423)  Intake/Output from previous day: 02/27 0701 - 02/28 0700 In: 800 [P.O.:800] Out: 2525 [Urine:2455; Chest Tube:70]  General appearance: alert, cooperative and no distress Heart: regular rate and rhythm Lungs: clear to auscultation bilaterally Abdomen: soft, non-tender; bowel sounds normal; no masses,  no organomegaly Extremities: edema trace Wound: clean and dry  Lab Results: Recent Labs    01/08/21 0207 01/09/21 0104  WBC 12.1* 10.9*  HGB 8.2* 8.6*  HCT 23.7* 24.1*  PLT 140* 187   BMET:  Recent Labs    01/08/21 0207 01/09/21 0104  NA 129* 132*  K 4.8 5.1  CL 100 101  CO2 25 24  GLUCOSE 129* 122*  BUN 49* 44*  CREATININE 1.46* 1.31*  CALCIUM 7.1* 7.5*    PT/INR: No results for input(s): LABPROT, INR in the last 72 hours. ABG    Component Value Date/Time   PHART 7.323 (L) 01/05/2021 1834   HCO3 22.2 01/05/2021 1834   TCO2 23 01/05/2021 1834   ACIDBASEDEF 4.0 (H) 01/05/2021 1834   O2SAT 94.0 01/05/2021 1834   CBG (last 3)  Recent Labs    01/07/21 2343 01/08/21 0419 01/08/21 0742   GLUCAP 124* 125* 124*    Assessment/Plan: S/P Procedure(s) (LRB): CORONARY ARTERY BYPASS GRAFTING (CABG) TIMES FOUR, USING BILATERAL INTERNAL MAMMARY ARTERIES AND LEFT RADIAL ARTERY (N/A) RADIAL ARTERY HARVEST (Left) TRANSESOPHAGEAL ECHOCARDIOGRAM (TEE) (N/A) INDOCYANINE GREEN FLUORESCENCE IMAGING (ICG) (N/A)  1. CV- NSR, BP improved- continue Lopressor, Isordil, on Colchicine for suspected pericarditis 2. Pulm- CT output 200 cc yesterday, CXR remains stable, + atelectasis, will discuss CT management with Dr. Vickey Sages 3. Renal-creatinine improved to 1.31, K is at 5.1 will stop supplementation, continue lasix for now 4. Expected post operative blood loss anemia, moderate but stable at 8.6, continue iron 5.  GI- bowel exam improved today, BS have increased, patient has moved bowels, continue reglan for remaining doses, continue Miralax 6. Dispo- patient stable, CT output decreasing to 200 cc yesterday, possibly remove today will discuss with Dr. Vickey Sages, stop potassium supplement with K of 5.1, continue ambulation   LOS: 4 days    Lowella Dandy, PA-C 01/09/2021

## 2021-01-09 NOTE — Progress Notes (Signed)
CARDIAC REHAB PHASE I   Went to offer to walk with pt. Pt requesting pain medicine and to walk after. Will f/u to encourage continued ambulation.  Reynold Bowen, RN BSN 01/09/2021 11:00 AM

## 2021-01-09 NOTE — Addendum Note (Signed)
Addendum  created 01/09/21 0724 by Rachel Moulds, CRNA   Order list changed, Pharmacy for encounter modified

## 2021-01-09 NOTE — Progress Notes (Signed)
Mobility Specialist: Progress Note   01/09/21 1425  Mobility  Activity Ambulated in hall  Level of Assistance Contact guard assist, steadying assist  Assistive Device Four wheel walker  Distance Ambulated (ft) 470 ft  Mobility Response Tolerated well  Mobility performed by Mobility specialist  $Mobility charge 1 Mobility   Post-Mobility: 94 HR, 148/87 BP, 97% SpO2  Pt ambulated on 2 L/min [place]. Pt asx during ambulation. After returning to room pt was taken off O2 and sats are holding in the mid 90%s. Encouraged IS use and to continue walks.   [facility] Rodney Strickland Mobility Specialist Mobility Specialist Phone: [phone]

## 2021-01-09 NOTE — Progress Notes (Addendum)
Confirmed with cardiothoracic PA about 80 ml output coming out from the chest tube this morning prior to pull out the chest tube. Ok to pull out per PA. D/c 3 lines of chest tube. Pt tolerated fair. VSS.   Lawson Radar, RN

## 2021-01-09 NOTE — Progress Notes (Signed)
CARDIAC REHAB PHASE I   Went to offer to walk with pt. Pt ambulating with mobility tech. Stressed importance of sternal precautions, IS use, and walks. Pt encouraged to ambulate with wife. Taken off O2, maintaining sats on RA. Began some d/c education. Pt requesting rollator for home use. CM aware. Will continue to follow and encourage ambulation.  [id number] Reynold Bowen, RN BSN 01/09/2021 2:32 PM

## 2021-01-10 ENCOUNTER — Inpatient Hospital Stay (HOSPITAL_COMMUNITY): Payer: Federal, State, Local not specified - PPO

## 2021-01-10 MED ORDER — OXYCODONE-ACETAMINOPHEN 5-325 MG PO TABS: 1.0000 | ORAL_TABLET | ORAL | 0 refills | Status: DC | PRN

## 2021-01-10 MED ORDER — COLCHICINE 0.6 MG PO TABS: 0.6000 mg | ORAL_TABLET | Freq: Two times a day (BID) | ORAL | 1 refills | Status: DC

## 2021-01-10 MED ORDER — FUROSEMIDE 40 MG PO TABS: 40.0000 mg | ORAL_TABLET | Freq: Every day | ORAL | 0 refills | Status: DC

## 2021-01-10 MED ORDER — METOPROLOL TARTRATE 25 MG PO TABS: 37.5000 mg | ORAL_TABLET | Freq: Two times a day (BID) | ORAL | 5 refills | Status: DC

## 2021-01-10 MED ORDER — FE FUMARATE-B12-VIT C-FA-IFC PO CAPS: 1.0000 | ORAL_CAPSULE | Freq: Two times a day (BID) | ORAL | 0 refills | Status: DC

## 2021-01-10 MED ORDER — ISOSORBIDE DINITRATE 10 MG PO TABS: 10.0000 mg | ORAL_TABLET | Freq: Three times a day (TID) | ORAL | 1 refills | Status: DC

## 2021-01-10 NOTE — Progress Notes (Signed)
5 Days Post-Op Procedure(s) (LRB): CORONARY ARTERY BYPASS GRAFTING (CABG) TIMES FOUR, USING BILATERAL INTERNAL MAMMARY ARTERIES AND LEFT RADIAL ARTERY (N/A) RADIAL ARTERY HARVEST (Left) TRANSESOPHAGEAL ECHOCARDIOGRAM (TEE) (N/A) INDOCYANINE GREEN FLUORESCENCE IMAGING (ICG) (N/A) Subjective: No complaints; feeling better  Objective: Vital signs in last 24 hours: Temp:  [97.7 F (36.5 C)-98.1 F (36.7 C)] 98 F (36.7 C) (03/01 0320) Pulse Rate:  [80-100] 87 (03/01 0320) Cardiac Rhythm: Normal sinus rhythm (03/01 0100) Resp:  [12-22] 20 (03/01 0320) BP: (104-148)/(71-91) 139/90 (03/01 0320) SpO2:  [92 %-100 %] 93 % (03/01 0320) Weight:  [82.8 kg] 82.8 kg (03/01 0320)  Hemodynamic parameters for last 24 hours:    Intake/Output from previous day: 02/28 0701 - 03/01 0700 In: 240 [P.O.:240] Out: 830 [Urine:750; Chest Tube:80] Intake/Output this shift: No intake/output data recorded.  General appearance: alert and cooperative Neurologic: intact Heart: regular rate and rhythm, S1, S2 normal, no murmur, click, rub or gallop Lungs: clear to auscultation bilaterally Abdomen: soft, non-tender; bowel sounds normal; no masses,  no organomegaly Extremities: extremities normal, atraumatic, no cyanosis or edema Wound: c/d/i  Lab Results: Recent Labs    01/08/21 0207 01/09/21 0104  WBC 12.1* 10.9*  HGB 8.2* 8.6*  HCT 23.7* 24.1*  PLT 140* 187   BMET:  Recent Labs    01/08/21 0207 01/09/21 0104  NA 129* 132*  K 4.8 5.1  CL 100 101  CO2 25 24  GLUCOSE 129* 122*  BUN 49* 44*  CREATININE 1.46* 1.31*  CALCIUM 7.1* 7.5*    PT/INR: No results for input(s): LABPROT, INR in the last 72 hours. ABG    Component Value Date/Time   PHART 7.323 (L) 01/05/2021 1834   HCO3 22.2 01/05/2021 1834   TCO2 23 01/05/2021 1834   ACIDBASEDEF 4.0 (H) 01/05/2021 1834   O2SAT 94.0 01/05/2021 1834   CBG (last 3)  Recent Labs    01/07/21 2343 01/08/21 0419 01/08/21 0742  GLUCAP 124* 125*  124*    Assessment/Plan: S/P Procedure(s) (LRB): CORONARY ARTERY BYPASS GRAFTING (CABG) TIMES FOUR, USING BILATERAL INTERNAL MAMMARY ARTERIES AND LEFT RADIAL ARTERY (N/A) RADIAL ARTERY HARVEST (Left) TRANSESOPHAGEAL ECHOCARDIOGRAM (TEE) (N/A) INDOCYANINE GREEN FLUORESCENCE IMAGING (ICG) (N/A) Ok for discharge today from surg perspective F/u next Monday in office   LOS: 5 days    [name] 01/10/2021

## 2021-01-10 NOTE — Progress Notes (Signed)
CARDIAC REHAB PHASE I   D/c education completed with pt and wife. Pt educated on importance of site care and monitoring incision daily. Encouraged continued IS use, walks, and sternal precautions. Pt given in-the-tube sheet, cardiac surgery booklet, and heart healthy diet. Reviewed restrictions and exercise guidelines. Will refer to CRP II [place]. Pt requesting rollator for home use, CM and PA aware.   [id number] Reynold Bowen, RN BSN 01/10/2021 9:43 AM

## 2021-01-10 NOTE — Progress Notes (Signed)
Mobility Specialist: Progress Note   01/10/21 1417  Mobility  Activity Ambulated in hall  Level of Assistance Standby assist, set-up cues, supervision of patient - no hands on  Assistive Device Four wheel walker  Distance Ambulated (ft) 470 ft  Mobility Response Tolerated well  Mobility performed by Mobility specialist  $Mobility charge 1 Mobility   Pre-Mobility: 95 HR, 96% SpO2 Post-Mobility: 95 HR, 141/86 BP, 97% SpO2  Pt took a brief standing break due to feeling SOB, quickly resolved. Pt sitting EOB with family member in room.   [facility] Rodney Strickland Mobility Specialist Mobility Specialist Phone: [phone]

## 2021-01-10 NOTE — TOC Transition Note (Signed)
Transition of Care (TOC) - CM/SW Discharge Note Donn Pierini RN, BSN Transitions of Care Unit 4E- RN Case Manager See Treatment Team for direct phone #    Patient Details  Name: Rodney Strickland MRN: [id number] Date of Birth: [birth year]  Transition of [facility]) CM/SW Contact:  Darrold Span, RN Phone Number: 01/10/2021, 12:13 PM   Clinical Narrative:    Pt stable for transition home today, per cardiac rehab pt needs rollator for home- order has been placed. Pt to return home with wife s/p CABG Call made to [employer] line for rollator referral- rollator to be delivered to room prior to discharge.    Final next level of care: Home/Self Care Barriers to Discharge: No Barriers Identified   Patient Goals and CMS Choice Patient states their goals for this hospitalization and ongoing recovery are:: return home   Choice offered to / list presented to : NA  Discharge Placement               Home        Discharge Plan and Services   Discharge Planning Services: CM Consult Post Acute Care Choice: Durable Medical Equipment          DME Arranged: Walker rolling with seat DME Agency: [employer] Date DME Agency Contacted: 01/10/21 Time DME Agency Contacted: 1100 Representative spoke with at DME Agency: sheila HH Arranged: NA HH Agency: NA        Social Determinants of Health (SDOH) Interventions     Readmission Risk Interventions Readmission Risk Prevention Plan 01/10/2021  Transportation Screening Complete  PCP or Specialist Appt within 5-7 Days Complete  Home Care Screening Complete  Medication Review (RN CM) Complete  Some recent data might be hidden

## 2021-01-10 NOTE — Progress Notes (Signed)
D/c tele and IVs. Went over AVS with pt and his wife and all questions were answered. Pt going home with rollator.   Lawson Radar, RN

## 2021-01-11 ENCOUNTER — Other Ambulatory Visit: Payer: Self-pay | Admitting: *Deleted

## 2021-01-11 MED ORDER — ONDANSETRON HCL 4 MG PO TABS: 4.0000 mg | ORAL_TABLET | Freq: Three times a day (TID) | ORAL | 0 refills | Status: DC | PRN

## 2021-01-11 NOTE — Progress Notes (Signed)
Pt's wife called for patient stating he has been nauseous since discharge from the hospital yesterday. Per wife, pt is unable to eat. She states he had a BM yesterday and one today. States he has not vomited. Per E. Barrett, prescription for Zofran sent in to pt's preferred pharmacy. Wife also states pt's BP have been elevated 162/90, 166/100, most recent 158/95. Per PA, instructed wife that pt may resume taking his home dose of lisinopril of 10mg  every day. Wife states she has already begun that regimen. Per PA, they will reach out to speak with pt's wife regarding high blood pressure.

## 2021-01-13 ENCOUNTER — Other Ambulatory Visit: Payer: Self-pay | Admitting: Cardiothoracic Surgery

## 2021-01-13 DIAGNOSIS — Z951 Presence of aortocoronary bypass graft: Secondary | ICD-10-CM

## 2021-01-13 MED FILL — ROSUVASTATIN CALCIUM 10 MG: 10 | 30 days supply | Qty: 30 | Fill #0

## 2021-01-13 MED FILL — ALLOPURINOL 100 MG TABS: 100 | 30 days supply | Qty: 30 | Fill #1

## 2021-01-16 ENCOUNTER — Other Ambulatory Visit: Payer: Self-pay

## 2021-01-16 ENCOUNTER — Ambulatory Visit (INDEPENDENT_AMBULATORY_CARE_PROVIDER_SITE_OTHER): Payer: Self-pay | Admitting: Cardiothoracic Surgery

## 2021-01-16 ENCOUNTER — Other Ambulatory Visit: Payer: Self-pay | Admitting: Cardiothoracic Surgery

## 2021-01-16 ENCOUNTER — Other Ambulatory Visit: Payer: Self-pay | Admitting: *Deleted

## 2021-01-16 ENCOUNTER — Ambulatory Visit
Admission: RE | Admit: 2021-01-16 | Discharge: 2021-01-16 | Disposition: A | Discharge status: Home / Self Care | Payer: Federal, State, Local not specified - PPO | Source: Ambulatory Visit | Attending: Cardiothoracic Surgery | Admitting: Cardiothoracic Surgery

## 2021-01-16 ENCOUNTER — Encounter: Payer: Self-pay | Admitting: Cardiothoracic Surgery

## 2021-01-16 VITALS — BP 119/81 | HR 84 | Temp 97.9°F | Resp 20 | Wt 170.2 lb

## 2021-01-16 DIAGNOSIS — Z951 Presence of aortocoronary bypass graft: Secondary | ICD-10-CM | POA: Diagnosis not present

## 2021-01-16 DIAGNOSIS — R0602 Shortness of breath: Secondary | ICD-10-CM | POA: Diagnosis not present

## 2021-01-16 MED ORDER — AMLODIPINE BESYLATE 5 MG PO TABS: 5.0000 mg | ORAL_TABLET | Freq: Every day | ORAL | 0 refills | Status: DC

## 2021-01-16 MED ORDER — FUROSEMIDE 80 MG PO TABS: 80.0000 mg | ORAL_TABLET | Freq: Every day | ORAL | 11 refills | Status: DC

## 2021-01-16 NOTE — Progress Notes (Signed)
[address]       [place] [address]             [phone]     CARDIOTHORACIC SURGERY OFFICE NOTE  Referring Provider is Juliann Pares, DO Primary Cardiologist is No primary care provider on file. PCP is Juliann Pares, DO   HPI:  55 year old man presents for first outpatient visit status post CABG on 01/05/2021.  He did generally well after surgery although he does have an elevated creatinine at baseline.  He also had a relative degree of anemia after surgery.  Today he complains of weakness and some shortness of breath.  He states his appetite is poor and he is not sleeping well either.  He was started on lisinopril upon discharge.  He is taking Lasix but not having a great response to 40 mg daily.  Denies chest pain or fevers and chills   Current Outpatient Medications  Medication Sig Dispense Refill  . allopurinol (ZYLOPRIM) 100 MG tablet Take 100 mg by mouth daily.    Marland Kitchen aspirin EC 81 MG tablet Take 81 mg by mouth daily. Swallow whole.    . cholecalciferol (VITAMIN D3) 25 MCG (1000 UNIT) tablet Take 1,000 Units by mouth daily.    . ferrous fumarate-b12-vitamic C-folic acid (TRINSICON / FOLTRIN) capsule Take 1 capsule by mouth 2 (two) times daily after a meal. 60 capsule 0  . furosemide (LASIX) 80 MG tablet Take 1 tablet (80 mg total) by mouth daily. 30 tablet 11  . loratadine (CLARITIN) 10 MG tablet Take 10 mg by mouth daily as needed for allergies.    . metoprolol tartrate (LOPRESSOR) 25 MG tablet Take 1.5 tablets (37.5 mg total) by mouth 2 (two) times daily. 100 tablet 5  . Multiple Vitamins-Minerals (MULTIVITAMIN WITH MINERALS) tablet Take 1 tablet by mouth daily.    . nitroGLYCERIN (NITROSTAT) 0.4 MG SL tablet Place 1 tablet (0.4 mg total) under the tongue every 5 (five) minutes as needed for chest pain. 25 tablet 3  . ondansetron (ZOFRAN) 4 MG tablet Take 1 tablet (4 mg total) by mouth every 8 (eight) hours as needed for nausea or vomiting. 20  tablet 0  . oxyCODONE-acetaminophen (PERCOCET) 5-325 MG tablet Take 1 tablet by mouth every 4 (four) hours as needed for severe pain. 30 tablet 0  . polyethylene glycol (MIRALAX / GLYCOLAX) 17 g packet Take 17 g by mouth daily as needed. 14 each 0  . rosuvastatin (CRESTOR) 10 MG tablet Take 10 mg by mouth at bedtime.    . vitamin C (ASCORBIC ACID) 500 MG tablet Take 500 mg by mouth daily.    Marland Kitchen zinc gluconate 50 MG tablet Take 50 mg by mouth daily.    Marland Kitchen amLODipine (NORVASC) 5 MG tablet Take 1 tablet (5 mg total) by mouth daily. 30 tablet 0   No current facility-administered medications for this visit.      Physical Exam:   BP 119/81 (BP Location: Right Arm, Patient Position: Sitting, Cuff Size: Normal)   Pulse 84   Temp 97.9 F (36.6 C) (Skin)   Resp 20   Wt 77.2 kg   SpO2 97% Comment: RA  BMI 27.47 kg/m   General:  No acute distress but weak in appearance  Chest:   Clear to auscultation except at left base  CV:   Regular rate and rhythm  Incisions:  Healing well; staples are removed from the left forearm and the wound is Steri-Stripped  Abdomen:  Soft nontender mildly swollen  Extremities:  2+ edema bilaterally  Diagnostic Tests:  Chest x-ray with clear lung fields but a small left pleural effusion.   Impression:  Doing reasonably well after CABG.  Plan:  Check CBC and basic metabolic panel today Stop colchicine Stop lisinopril Stop isosorbide Lasix 80 mg p.o. daily Follow-up in 2 weeks with chest x-ray  I spent in excess of 20 minutes during the conduct of this office consultation and >50% of this time involved direct face-to-face encounter with the patient for counseling and/or coordination of their care.  Level 2                 10 minutes Level 3                 15 minutes Level 4                 25 minutes Level 5                 40 minutes  B.  Lorayne Marek, MD 01/16/2021 1:55 PM

## 2021-01-17 ENCOUNTER — Inpatient Hospital Stay (HOSPITAL_COMMUNITY): Payer: Federal, State, Local not specified - PPO

## 2021-01-17 ENCOUNTER — Inpatient Hospital Stay (HOSPITAL_COMMUNITY)
Admission: AD | Admit: 2021-01-17 | Discharge: 2021-01-22 | DRG: 683 | Disposition: A | Discharge status: Home / Self Care | Payer: Federal, State, Local not specified - PPO | Source: Ambulatory Visit | Attending: Cardiothoracic Surgery | Admitting: Cardiothoracic Surgery

## 2021-01-17 ENCOUNTER — Other Ambulatory Visit: Payer: Self-pay

## 2021-01-17 ENCOUNTER — Encounter (HOSPITAL_COMMUNITY): Payer: Self-pay | Admitting: Cardiothoracic Surgery

## 2021-01-17 DIAGNOSIS — E875 Hyperkalemia: Secondary | ICD-10-CM

## 2021-01-17 DIAGNOSIS — Z20822 Contact with and (suspected) exposure to covid-19: Secondary | ICD-10-CM | POA: Diagnosis present

## 2021-01-17 DIAGNOSIS — N1831 Chronic kidney disease, stage 3a: Secondary | ICD-10-CM | POA: Diagnosis not present

## 2021-01-17 DIAGNOSIS — E8809 Other disorders of plasma-protein metabolism, not elsewhere classified: Secondary | ICD-10-CM | POA: Diagnosis present

## 2021-01-17 DIAGNOSIS — N049 Nephrotic syndrome with unspecified morphologic changes: Secondary | ICD-10-CM | POA: Diagnosis present

## 2021-01-17 DIAGNOSIS — R627 Adult failure to thrive: Secondary | ICD-10-CM

## 2021-01-17 DIAGNOSIS — I5032 Chronic diastolic (congestive) heart failure: Secondary | ICD-10-CM | POA: Diagnosis present

## 2021-01-17 DIAGNOSIS — I314 Cardiac tamponade: Secondary | ICD-10-CM

## 2021-01-17 DIAGNOSIS — I130 Hypertensive heart and chronic kidney disease with heart failure and stage 1 through stage 4 chronic kidney disease, or unspecified chronic kidney disease: Secondary | ICD-10-CM | POA: Diagnosis present

## 2021-01-17 DIAGNOSIS — J90 Pleural effusion, not elsewhere classified: Secondary | ICD-10-CM

## 2021-01-17 DIAGNOSIS — J918 Pleural effusion in other conditions classified elsewhere: Secondary | ICD-10-CM | POA: Diagnosis present

## 2021-01-17 DIAGNOSIS — E860 Dehydration: Secondary | ICD-10-CM | POA: Diagnosis present

## 2021-01-17 DIAGNOSIS — I10 Essential (primary) hypertension: Secondary | ICD-10-CM | POA: Diagnosis not present

## 2021-01-17 DIAGNOSIS — N289 Disorder of kidney and ureter, unspecified: Secondary | ICD-10-CM

## 2021-01-17 DIAGNOSIS — Z8249 Family history of ischemic heart disease and other diseases of the circulatory system: Secondary | ICD-10-CM

## 2021-01-17 DIAGNOSIS — Z9889 Other specified postprocedural states: Secondary | ICD-10-CM

## 2021-01-17 DIAGNOSIS — E871 Hypo-osmolality and hyponatremia: Secondary | ICD-10-CM | POA: Diagnosis not present

## 2021-01-17 DIAGNOSIS — N179 Acute kidney failure, unspecified: Secondary | ICD-10-CM

## 2021-01-17 DIAGNOSIS — I2510 Atherosclerotic heart disease of native coronary artery without angina pectoris: Secondary | ICD-10-CM | POA: Diagnosis present

## 2021-01-17 DIAGNOSIS — E785 Hyperlipidemia, unspecified: Secondary | ICD-10-CM | POA: Diagnosis not present

## 2021-01-17 DIAGNOSIS — Z951 Presence of aortocoronary bypass graft: Secondary | ICD-10-CM

## 2021-01-17 DIAGNOSIS — E872 Acidosis: Secondary | ICD-10-CM | POA: Diagnosis not present

## 2021-01-17 DIAGNOSIS — E781 Pure hyperglyceridemia: Secondary | ICD-10-CM | POA: Diagnosis present

## 2021-01-17 DIAGNOSIS — S20211A Contusion of right front wall of thorax, initial encounter: Secondary | ICD-10-CM | POA: Diagnosis present

## 2021-01-17 DIAGNOSIS — R601 Generalized edema: Secondary | ICD-10-CM | POA: Diagnosis not present

## 2021-01-17 DIAGNOSIS — M109 Gout, unspecified: Secondary | ICD-10-CM | POA: Diagnosis not present

## 2021-01-17 LAB — BASIC METABOLIC PANEL
Anion gap: 7 (ref 5–15)
BUN/Creatinine Ratio: 23 (calc) — ABNORMAL HIGH (ref 6–22)
BUN: 75 mg/dL — ABNORMAL HIGH (ref 7–25)
BUN: 71 mg/dL — ABNORMAL HIGH (ref 6–20)
CO2: 22 mmol/L (ref 20–32)
CO2: 20 mmol/L — ABNORMAL LOW (ref 22–32)
Calcium: 7.6 mg/dL — ABNORMAL LOW (ref 8.6–10.3)
Calcium: 7.6 mg/dL — ABNORMAL LOW (ref 8.9–10.3)
Chloride: 104 mmol/L (ref 98–110)
Chloride: 105 mmol/L (ref 98–111)
Creat: 3.21 mg/dL — ABNORMAL HIGH (ref 0.70–1.33)
Creatinine, Ser: 3.02 mg/dL — ABNORMAL HIGH (ref 0.61–1.24)
GFR, Estimated: 24 mL/min — ABNORMAL LOW (ref >60)
Glucose, Bld: 106 mg/dL — ABNORMAL HIGH (ref 65–99)
Glucose, Bld: 123 mg/dL — ABNORMAL HIGH (ref 70–99)
Potassium: 5.5 mmol/L — ABNORMAL HIGH (ref 3.5–5.3)
Potassium: 4.6 mmol/L (ref 3.5–5.1)
Sodium: 132 mmol/L — ABNORMAL LOW (ref 135–146)
Sodium: 132 mmol/L — ABNORMAL LOW (ref 135–145)

## 2021-01-17 LAB — CBC WITH DIFFERENTIAL/PLATELET
Absolute Monocytes: 821 cells/uL (ref 200–950)
Basophils Absolute: 56 cells/uL (ref 0–200)
Basophils Relative: 0.5 %
Eosinophils Absolute: 200 cells/uL (ref 15–500)
Eosinophils Relative: 1.8 %
HCT: 32 % — ABNORMAL LOW (ref 38.5–50.0)
Hemoglobin: 10.8 g/dL — ABNORMAL LOW (ref 13.2–17.1)
Lymphs Abs: 1410 cells/uL (ref 850–3900)
MCH: 30.3 pg (ref 27.0–33.0)
MCHC: 33.8 g/dL (ref 32.0–36.0)
MCV: 89.9 fL (ref 80.0–100.0)
MPV: 9.3 fL (ref 7.5–12.5)
Monocytes Relative: 7.4 %
Neutro Abs: 8614 cells/uL — ABNORMAL HIGH (ref 1500–7800)
Neutrophils Relative %: 77.6 %
Platelets: 455 10*3/uL — ABNORMAL HIGH (ref 140–400)
RBC: 3.56 10*6/uL — ABNORMAL LOW (ref 4.20–5.80)
RDW: 13.2 % (ref 11.0–15.0)
Total Lymphocyte: 12.7 %
WBC: 11.1 10*3/uL — ABNORMAL HIGH (ref 3.8–10.8)

## 2021-01-17 LAB — ECHOCARDIOGRAM COMPLETE
Area-P 1/2: 3.72 cm2
S' Lateral: 2.3 cm

## 2021-01-17 LAB — CBC
HCT: 29.1 % — ABNORMAL LOW (ref 39.0–52.0)
Hemoglobin: 10.4 g/dL — ABNORMAL LOW (ref 13.0–17.0)
MCH: 31.6 pg (ref 26.0–34.0)
MCHC: 35.7 g/dL (ref 30.0–36.0)
MCV: 88.4 fL (ref 80.0–100.0)
Platelets: 442 10*3/uL — ABNORMAL HIGH (ref 150–400)
RBC: 3.29 MIL/uL — ABNORMAL LOW (ref 4.22–5.81)
RDW: 13.2 % (ref 11.5–15.5)
WBC: 9 10*3/uL (ref 4.0–10.5)
nRBC: 0 % (ref 0.0–0.2)

## 2021-01-17 MED ORDER — ENOXAPARIN SODIUM 30 MG/0.3ML ~~LOC~~ SOLN
30.0000 mg | SUBCUTANEOUS | Status: DC
  Administered 2021-01-17 – 2021-01-21 (×4): 30 mg via SUBCUTANEOUS
  Filled 2021-01-17 (×4): qty 0.3

## 2021-01-17 MED ORDER — SODIUM CHLORIDE 0.45 % IV SOLN: INTRAVENOUS | Status: DC

## 2021-01-17 MED ORDER — METOPROLOL TARTRATE 25 MG PO TABS
25.0000 mg | ORAL_TABLET | Freq: Two times a day (BID) | ORAL | Status: DC
  Administered 2021-01-17 – 2021-01-22 (×10): 25 mg via ORAL
  Filled 2021-01-17 (×10): qty 1

## 2021-01-17 MED ORDER — ENSURE ENLIVE PO LIQD
237.0000 mL | Freq: Three times a day (TID) | ORAL | Status: DC
  Administered 2021-01-17 – 2021-01-21 (×6): 237 mL via ORAL

## 2021-01-17 MED ORDER — LORATADINE 10 MG PO TABS: 10.0000 mg | ORAL_TABLET | Freq: Every day | ORAL | Status: DC | PRN

## 2021-01-17 MED ORDER — ACETAMINOPHEN 325 MG PO TABS: 650.0000 mg | ORAL_TABLET | Freq: Four times a day (QID) | ORAL | Status: DC | PRN

## 2021-01-17 MED ORDER — ASPIRIN EC 81 MG PO TBEC
81.0000 mg | DELAYED_RELEASE_TABLET | Freq: Every day | ORAL | Status: DC
  Administered 2021-01-18 – 2021-01-22 (×5): 81 mg via ORAL
  Filled 2021-01-17 (×5): qty 1

## 2021-01-17 MED ORDER — ONDANSETRON HCL 4 MG/2ML IJ SOLN: 4.0000 mg | Freq: Four times a day (QID) | INTRAMUSCULAR | Status: DC | PRN

## 2021-01-17 MED ORDER — POLYETHYLENE GLYCOL 3350 17 G PO PACK
17.0000 g | PACK | Freq: Every day | ORAL | Status: DC | PRN
  Administered 2021-01-21: 17 g via ORAL
  Filled 2021-01-17: qty 1

## 2021-01-17 MED ORDER — OXYCODONE HCL 5 MG PO TABS
5.0000 mg | ORAL_TABLET | ORAL | Status: DC | PRN
  Administered 2021-01-19: 5 mg via ORAL
  Filled 2021-01-17 (×2): qty 1

## 2021-01-17 MED ORDER — ROSUVASTATIN CALCIUM 5 MG PO TABS
10.0000 mg | ORAL_TABLET | Freq: Every day | ORAL | Status: DC
  Administered 2021-01-17 – 2021-01-18 (×2): 10 mg via ORAL
  Filled 2021-01-17 (×2): qty 2

## 2021-01-17 MED ORDER — DOCUSATE SODIUM 100 MG PO CAPS
100.0000 mg | ORAL_CAPSULE | Freq: Two times a day (BID) | ORAL | Status: DC | PRN
  Administered 2021-01-22: 100 mg via ORAL
  Filled 2021-01-17: qty 1

## 2021-01-17 NOTE — Progress Notes (Signed)
   Prelim echo report:  The echo file is locked up and I am not able to read the study  LV - normal LV function,  Small LV cavity No significant valvular disease There is no pericardial effusion  Full report to follow when the study can be officially read    Kristeen Miss, MD  01/17/2021 5:12 PM    [facility] [address]  [address] [place], [place]  [address] Phone: [phone]; Fax: [phone]

## 2021-01-17 NOTE — H&P (Addendum)
[address]       [place] [address]             [phone]      Patient is a 55 y.o. male well known to [facility].  He recently underwent CABG x 4 on 01/05/2021.  His hospital stay was complicated by post operative pericarditis, poor appetite, elevated blood pressures, and elevated creatinine.  These symptoms all improved prior to discharge and he was discharged on his home antihypertensive regimen.  He was discharged home on 01/10/2021.  His wife contacted our office on 01/11/2021 with concerns about patient having issues with nausea.  There was also concerns about patients elevated BP.  I called and spoke with the patient's wife who overall was very concerned about her husbands elevated blood pressure.  However, being the patient was just resumed on all his home medications the day prior I felt we should continue his medications as ordered.  He was given a prescription for zofran to help with nausea.  The patient's wife stated he was up and moving around, however he would experience some dizziness.  They were instructed to stop Lasix and only take on days, the patient experienced a weight gain of 3 lbs or more.  They presented for scheduled hospital discharge follow up with Dr. Vickey Sages on 01/16/2021 at which time the patient's issues persisted.  He presented in a wheel chair with complaints of weakness and shortness of breath.  He also stated his appetite was poor, nausea, and he was not sleeping well.  He was taking his Lasix without much of a response.  CXR was obtained and showed a left sided pleural effusion.  BMET was also obtained and showed an elevated creatinine of 3.21, BUN was 75,  K was elevated at 5.5, and his hemoglobin level improved to 10.8.  It was felt the patient would require admission.  He was contacted and direct admission was arranged.   Patient Active Problem List   Diagnosis Date Noted  . Failure to thrive in adult 01/17/2021  . AKI (acute kidney injury) (HCC)  01/17/2021  . Hyperkalemia 01/17/2021  . S/P CABG x 4 01/05/2021  . Abnormal cardiac CT angiography 12/29/2020  . Unstable angina (HCC) 12/08/2020  . Right shoulder pain 11/21/2016   Past Medical History:  Diagnosis Date  . Coronary artery disease   . Gout   . Hypercholesteremia   . Hypertension   . Injury of both lungs 1986   water in lungs    Past Surgical History:  Procedure Laterality Date  . CARDIAC CATHETERIZATION    . CHOLECYSTECTOMY    . CORONARY ARTERY BYPASS GRAFT N/A 01/05/2021   Procedure: CORONARY ARTERY BYPASS GRAFTING (CABG) TIMES FOUR, USING BILATERAL INTERNAL MAMMARY ARTERIES AND LEFT RADIAL ARTERY;  Surgeon: [name], MD;  Location: [facility] OR;  Service: Open Heart Surgery;  Laterality: N/A;  . LEFT HEART CATH AND CORONARY ANGIOGRAPHY N/A 12/29/2020   Procedure: LEFT HEART CATH AND CORONARY ANGIOGRAPHY;  Surgeon: Yvonne Kendall, MD;  Location: [facility] INVASIVE CV LAB;  Service: Cardiovascular;  Laterality: N/A;  . RADIAL ARTERY HARVEST Left 01/05/2021   Procedure: RADIAL ARTERY HARVEST;  Surgeon: [name], MD;  Location: [facility] OR;  Service: Open Heart Surgery;  Laterality: Left;  . TEE WITHOUT CARDIOVERSION N/A 01/05/2021   Procedure: TRANSESOPHAGEAL ECHOCARDIOGRAM (TEE);  Surgeon: [name], MD;  [facility];  Service: Open Heart Surgery;  Laterality: N/A;  . THORACENTESIS  Medications prior to admission: these medications   allopurinol 100 MG tablet Commonly known as: ZYLOPRIM Take 100 mg by mouth daily.   aspirin EC 81 MG tablet Take 81 mg by mouth daily. Swallow whole.   cholecalciferol 25 MCG (1000 UNIT) tablet Commonly known as: VITAMIN D3 Take 1,000 Units by mouth daily.     furosemide 80 MG tablet Commonly known as: LASIX Take 1 tablet (80 mg total) by mouth daily.       loratadine 10 MG tablet Commonly known as: CLARITIN Take 10 mg by mouth daily as needed for allergies.   metoprolol tartrate 25 MG  tablet Commonly known as: LOPRESSOR Take 1.5 tablets (37.5 mg total) by mouth 2 (two) times daily. What changed: how much to take   multivitamin with minerals tablet Take 1 tablet by mouth daily.   nitroGLYCERIN 0.4 MG SL tablet Commonly known as: NITROSTAT Place 1 tablet (0.4 mg total) under the tongue every 5 (five) minutes as needed for chest pain.   oxyCODONE-acetaminophen 5-325 MG tablet Commonly known as: Percocet Take 1 tablet by mouth every 4 (four) hours as needed for severe pain.   polyethylene glycol 17 g packet Commonly known as: MIRALAX / GLYCOLAX Take 17 g by mouth daily as needed.   rosuvastatin 10 MG tablet Commonly known as: CRESTOR Take 10 mg by mouth at bedtime.   vitamin C 500 MG tablet Commonly known as: ASCORBIC ACID Take 500 mg by mouth daily.   zinc gluconate 50 MG tablet Take 50 mg by mouth daily.       No Known Allergies  Social History   Tobacco Use  . Smoking status: Never Smoker  . Smokeless tobacco: Never Used  Substance Use Topics  . Alcohol use: No    Family History  Problem Relation Age of Onset  . Diabetes type II Mother   . Hypertension Father   . Stroke Father   . Hypertension Sister     Review of Systems General Review of Systems: [Y] = yes [ N ]=no Constitional: anorexia [ Y ]; fatigue [ Y ]; nausea [ Y ]; night sweats [ N ]; fever [ N ]; or chills Klaus.Mock  ]                            Eye : blurred vision Klaus.Mock  ];   Amaurosis fugax[N  ]; Resp: cough [ N ];  wheezing[N  ];  hemoptysis[ N ]; ;  dyspnea on exertion[Y  ]; GI:  vomiting[ N ];  melena[  N];  hematochezia [ N ];  GU: hematuria[N  ];                Skin: rash, swelling[ N ];,  peripheral edema[ Y ];  or itching[ N ];  Heme/Lymph:   anemia[ Y ];  Neuro: TIA[ N ];  stroke[N  ];   seizures[ N ];     Endocrine: diabetes[ N ];   Objective:   No data found. No intake/output data recorded. No intake/output data recorded.  PE: CV-RRR, no  murmur Pulmonary-Decreased at left base, right lung is clear Abdomen-Soft, non tender, ++ bowel soudns Extremities-++ LE edema Wounds-Sternal and LUE wounds are clean, dry, and healing without sign of infection. Motor/sensory LUE intact. There are steri strips intact LUE Neurologic-Grossly intact without focal deficit   Assessment:   55 yo male S/P CABG x 4 01/05/2021 with AKI, uncontrolled HTN, failure  to thrive, and poor oral intake.    Plan:    1. AKI- suspect due to dehydration, diuretic use, and lisinopril- will start IV fluids at 100 ml per hour, avoid nephrotoxic agents, repeat BMET in AM. If kidney functions worsens, will consider nephrology consult. 2. Hyperkalemia- 5.5, monitor closely if doesn't improve with hydration and treat with Lokelma 3. S/P CABG x 4- will continue Lopressor 25 mg BID, will restart antihypertensive agents as needed; however, can run a little higher until creatinine recovers, TTE ordered to rule out cardiac tamponade 4. Left Pleural effusion- will repeat CXR in AM and as discussed with Dr. Vickey Sages, will likely get CT scan of the chest 5. GI- Zofran prn for nausea, stop iron as Hgb has improved. Encourage oral intake as able, ensure TID addeed  Erin Barrett, PA-C   Pt seen and examined; I agree with documentation including that of assessment/plan, which we formulated together. Await echo read; appreciate cardiology assistance in managing acute renal failure and anasarca. Roxas Clymer Z. Vickey Sages, MD [phone]
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
## 2021-01-17 NOTE — Progress Notes (Signed)
  Echocardiogram 2D Echocardiogram has been performed.  Rodney Strickland 01/17/2021, 4:46 PM

## 2021-01-18 ENCOUNTER — Inpatient Hospital Stay (HOSPITAL_COMMUNITY): Payer: Federal, State, Local not specified - PPO

## 2021-01-18 DIAGNOSIS — J90 Pleural effusion, not elsewhere classified: Secondary | ICD-10-CM | POA: Diagnosis not present

## 2021-01-18 DIAGNOSIS — N179 Acute kidney failure, unspecified: Secondary | ICD-10-CM | POA: Diagnosis not present

## 2021-01-18 DIAGNOSIS — I2510 Atherosclerotic heart disease of native coronary artery without angina pectoris: Secondary | ICD-10-CM | POA: Diagnosis not present

## 2021-01-18 DIAGNOSIS — J9811 Atelectasis: Secondary | ICD-10-CM | POA: Diagnosis not present

## 2021-01-18 DIAGNOSIS — I10 Essential (primary) hypertension: Secondary | ICD-10-CM | POA: Diagnosis not present

## 2021-01-18 DIAGNOSIS — E875 Hyperkalemia: Secondary | ICD-10-CM

## 2021-01-18 DIAGNOSIS — I5032 Chronic diastolic (congestive) heart failure: Secondary | ICD-10-CM | POA: Diagnosis not present

## 2021-01-18 DIAGNOSIS — Z951 Presence of aortocoronary bypass graft: Secondary | ICD-10-CM | POA: Diagnosis not present

## 2021-01-18 DIAGNOSIS — N289 Disorder of kidney and ureter, unspecified: Secondary | ICD-10-CM | POA: Diagnosis not present

## 2021-01-18 DIAGNOSIS — I509 Heart failure, unspecified: Secondary | ICD-10-CM | POA: Diagnosis not present

## 2021-01-18 LAB — SODIUM, URINE, RANDOM: Sodium, Ur: 10 mmol/L

## 2021-01-18 LAB — URINALYSIS, ROUTINE W REFLEX MICROSCOPIC
Bacteria, UA: NONE SEEN
Bilirubin Urine: NEGATIVE
Glucose, UA: 50 mg/dL — AB
Ketones, ur: NEGATIVE mg/dL
Leukocytes,Ua: NEGATIVE
Nitrite: NEGATIVE
Protein, ur: >=300 mg/dL — AB
Specific Gravity, Urine: 1.019 (ref 1.005–1.030)
pH: 6 (ref 5.0–8.0)

## 2021-01-18 LAB — BASIC METABOLIC PANEL
Anion gap: 8 (ref 5–15)
Anion gap: 7 (ref 5–15)
BUN: 70 mg/dL — ABNORMAL HIGH (ref 6–20)
BUN: 66 mg/dL — ABNORMAL HIGH (ref 6–20)
CO2: 16 mmol/L — ABNORMAL LOW (ref 22–32)
CO2: 16 mmol/L — ABNORMAL LOW (ref 22–32)
Calcium: 7.8 mg/dL — ABNORMAL LOW (ref 8.9–10.3)
Calcium: 7.5 mg/dL — ABNORMAL LOW (ref 8.9–10.3)
Chloride: 107 mmol/L (ref 98–111)
Chloride: 107 mmol/L (ref 98–111)
Creatinine, Ser: 2.7 mg/dL — ABNORMAL HIGH (ref 0.61–1.24)
Creatinine, Ser: 2.39 mg/dL — ABNORMAL HIGH (ref 0.61–1.24)
GFR, Estimated: 27 mL/min — ABNORMAL LOW (ref >60)
GFR, Estimated: 31 mL/min — ABNORMAL LOW (ref >60)
Glucose, Bld: 91 mg/dL (ref 70–99)
Glucose, Bld: 101 mg/dL — ABNORMAL HIGH (ref 70–99)
Potassium: 6.1 mmol/L — ABNORMAL HIGH (ref 3.5–5.1)
Potassium: 5.5 mmol/L — ABNORMAL HIGH (ref 3.5–5.1)
Sodium: 131 mmol/L — ABNORMAL LOW (ref 135–145)
Sodium: 130 mmol/L — ABNORMAL LOW (ref 135–145)

## 2021-01-18 MED ORDER — SODIUM BICARBONATE 8.4 % IV SOLN
INTRAVENOUS | Status: DC
  Filled 2021-01-18: qty 850

## 2021-01-18 MED ORDER — SODIUM BICARBONATE 650 MG PO TABS
1300.0000 mg | ORAL_TABLET | Freq: Three times a day (TID) | ORAL | Status: DC
  Administered 2021-01-18 – 2021-01-19 (×3): 1300 mg via ORAL
  Filled 2021-01-18 (×3): qty 2

## 2021-01-18 MED ORDER — ICOSAPENT ETHYL 1 G PO CAPS
2.0000 g | ORAL_CAPSULE | Freq: Two times a day (BID) | ORAL | Status: DC
  Administered 2021-01-18 – 2021-01-22 (×8): 2 g via ORAL
  Filled 2021-01-18 (×11): qty 2

## 2021-01-18 MED ORDER — SODIUM BICARBONATE 650 MG PO TABS: 1300.0000 mg | ORAL_TABLET | Freq: Two times a day (BID) | ORAL | Status: DC

## 2021-01-18 MED ORDER — ROSUVASTATIN CALCIUM 20 MG PO TABS
20.0000 mg | ORAL_TABLET | Freq: Every day | ORAL | Status: DC
  Administered 2021-01-19 – 2021-01-22 (×4): 20 mg via ORAL
  Filled 2021-01-18 (×4): qty 1

## 2021-01-18 MED ORDER — SODIUM ZIRCONIUM CYCLOSILICATE 10 G PO PACK
10.0000 g | PACK | Freq: Three times a day (TID) | ORAL | Status: DC
  Administered 2021-01-18 (×3): 10 g via ORAL
  Filled 2021-01-18 (×5): qty 1

## 2021-01-18 MED ORDER — DIPHENHYDRAMINE HCL 25 MG PO CAPS
25.0000 mg | ORAL_CAPSULE | Freq: Once | ORAL | Status: AC
  Administered 2021-01-18: 25 mg via ORAL
  Filled 2021-01-18: qty 1

## 2021-01-18 NOTE — Evaluation (Signed)
Physical Therapy Evaluation Patient Details Name: Rodney Strickland MRN: [id number] DOB: 1966/10/27 Today's Date: 01/18/2021   History of Present Illness  55 y.o. male with recent CABG x4 on 01/05/2021 complicated by pericarditis. Pt D/C home 01/10/2021. Since discharge pt continued to have symptoms of nausea, HTN, dizziness, weakness, and SOB and presented for post-discharge follow-up 01/16/2021. CXR demonstrates L pleural effusion. Pt also found to have AKI and hyperkalemia. Pt subsequently admitted. Other PMH includes CAD, gout, HTN.  Clinical Impression  Pt presents to PT with deficits in functional mobility, gait, balance, endurance, strength, power, and activity tolerance. Pt reports dyspnea with exertion although sats and RR remain stable. Pt does require assistance to perform bed mobility due to generalized weakness, and benefits from supervision for all OOB mobility to monitor symptoms and maintain safety. Pt will benefit from aggressive mobilization and acute PT services to improve mobility quality and to aide in a return to independence. PT recommends HHPT at the time of discharge.    Follow Up Recommendations Home health PT;Supervision - Intermittent    Equipment Recommendations  None recommended by PT    Recommendations for Other Services       Precautions / Restrictions Precautions Precautions: Fall Precaution Comments: monitor SpO2 Restrictions Weight Bearing Restrictions: No (sternal precautions)      Mobility  Bed Mobility Overal bed mobility: Needs Assistance Bed Mobility: Supine to Sit     Supine to sit: Min assist     General bed mobility comments: pt requires assistance to initiate elevating trunk into sitting    Transfers Overall transfer level: Needs assistance Equipment used: None Transfers: Sit to/from [employer] Sit to Stand: Min guard Stand pivot transfers: Min guard          Ambulation/Gait Ambulation/Gait assistance: Min  guard Gait Distance (Feet): 70 Feet Assistive device: None Gait Pattern/deviations: Step-to pattern Gait velocity: reduced Gait velocity interpretation: <1.8 ft/sec, indicate of risk for recurrent falls General Gait Details: pt with slowed step-to gait, reduced stride length, increased lateral sway  Stairs            Wheelchair Mobility    Modified Rankin (Stroke Patients Only)       Balance Overall balance assessment: Needs assistance Sitting-balance support: No upper extremity supported;Feet supported Sitting balance-Leahy Scale: Good     Standing balance support: No upper extremity supported;During functional activity Standing balance-Leahy Scale: Fair                               Pertinent Vitals/Pain Pain Assessment: No/denies pain    Home Living Family/patient expects to be discharged to:: Private residence Living Arrangements: Spouse/significant other;Children Available Help at Discharge: Family;Available 24 hours/day Type of Home: House Home Access: Stairs to enter Entrance Stairs-Rails: Right Entrance Stairs-Number of Steps: 5 Home Layout: Two level Home Equipment: Walker - 4 wheels      Prior Function Level of Independence: Independent         Comments: pt has been ambulating short household distances since CABG, independent prior to CABG     Hand Dominance        Extremity/Trunk Assessment   Upper Extremity Assessment Upper Extremity Assessment: RUE deficits/detail;LUE deficits/detail;Generalized weakness RUE Deficits / Details: PT notes bilateral UE edema, incision LUE clean dry and intact LUE Deficits / Details: PT notes bilateral UE edema    Lower Extremity Assessment Lower Extremity Assessment: Generalized weakness (PT notes BLE pitting edema)  Cervical / Trunk Assessment Cervical / Trunk Assessment: Normal (stenal incision dry and intact)  Communication   Communication: No difficulties  Cognition  Arousal/Alertness: Awake/alert Behavior During Therapy: WFL for tasks assessed/performed Overall Cognitive Status: Within Functional Limits for tasks assessed                                        General Comments General comments (skin integrity, edema, etc.): pt on RA, VSS, pt does report SOB although no significant increase in RR noted with activity    Exercises     Assessment/Plan    PT Assessment Patient needs continued PT services  PT Problem List Decreased strength;Decreased activity tolerance;Decreased balance;Decreased mobility;Decreased knowledge of use of DME;Cardiopulmonary status limiting activity       PT Treatment Interventions DME instruction;Gait training;Stair training;Functional mobility training;Therapeutic activities;Therapeutic exercise;Balance training;Patient/family education    PT Goals (Current goals can be found in the Care Plan section)  Acute Rehab PT Goals Patient Stated Goal: to improve activity tolerance and return to independence PT Goal Formulation: With patient/family Time For Goal Achievement: 02/01/21 Potential to Achieve Goals: Good    Frequency Min 3X/week   Barriers to discharge        Co-evaluation               AM-PAC PT "6 Clicks" Mobility  Outcome Measure Help needed turning from your back to your side while in a flat bed without using bedrails?: A Little Help needed moving from lying on your back to sitting on the side of a flat bed without using bedrails?: A Little Help needed moving to and from a bed to a chair (including a wheelchair)?: A Little Help needed standing up from a chair using your arms (e.g., wheelchair or bedside chair)?: A Little Help needed to walk in hospital room?: A Little Help needed climbing 3-5 steps with a railing? : A Little 6 Click Score: 18    End of Session   Activity Tolerance: Patient limited by fatigue Patient left: in chair;with call bell/phone within reach;with  family/visitor present Nurse Communication: Mobility status PT Visit Diagnosis: Other abnormalities of gait and mobility (R26.89);Muscle weakness (generalized) (M62.81);Other (comment) (cardiopulmonary deficits)    Time: 3976-7341 PT Time Calculation (min) (ACUTE ONLY): 15 min   Charges:   PT Evaluation $PT Eval Moderate Complexity: 1 Mod          Arlyss Gandy, PT, DPT Acute Rehabilitation Pager: [phone]   Arlyss Gandy 01/18/2021, 4:20 PM

## 2021-01-18 NOTE — Hospital Course (Addendum)
History of Present Illness:  Patient is a 55 y.o. male well known to [facility].  He recently underwent CABG x 4 on 01/05/2021.  His hospital stay was complicated by post operative pericarditis, poor appetite, elevated blood pressures, and elevated creatinine.  These symptoms all improved prior to discharge and he was discharged on his home antihypertensive regimen.  He was discharged home on 01/10/2021.  His wife contacted our office on 01/11/2021 with concerns about patient having issues with nausea.  There was also concerns about patients elevated BP.  I called and spoke with the patient's wife who overall was very concerned about her husbands elevated blood pressure.  However, being the patient was just resumed on all his home medications the day prior I felt we should continue his medications as ordered.  He was given a prescription for zofran to help with nausea.  The patient's wife stated he was up and moving around, however he would experience some dizziness.  They were instructed to stop Lasix and only take on days, the patient experienced a weight gain of 3 lbs or more.  They presented for scheduled hospital discharge follow up with Dr. Orvan Seen on 01/16/2021 at which time the patient's issues persisted.  He presented in a wheel chair with complaints of weakness and shortness of breath.  He also stated his appetite was poor, nausea, and he was not sleeping well.  He was taking his Lasix without much of a response.  CXR was obtained and showed a left sided pleural effusion.  BMET was also obtained and showed an elevated creatinine of 3.21, BUN was 75,  K was elevated at 5.5, and his hemoglobin level improved to 10.8.  It was felt the patient would require admission.  He was contacted and direct admission was arranged.  Hospital Course:  Upon arrival to the hospital Echocardiogram was obtained.  This did not show any evidence of cardiac tamponade.  His EF was also normal at 60-65%.  He was started on IV fluids due to  his elevated creatinine.  Repeat blood work showed some improvement in his creatinine to 2.7.  He remained hyperkalemic with level reaching 6.1.  He was started on Lokelma with good response.  Repeat level was down to 4.1  Renal ultrasound was ordered and Nephrology consult was obtained.  The patients CXR showed a slight increase in left pleural effusion with atelectasis and possible infiltrate.  CT of the chest was performed and showed a left pleural effusion.  He was also noted to have a right chest wall hematoma.  The patient was persistently hypertensive prior to admission to the hospital. Advanced heart failure was consulted to assist with management of the patient.  They evaluated patient and felt he could very well be suffering from nephrotic syndrome.  They ordered multiple urine studies.  His UA was positive for glucose, blood, and protein.  A total urine protein count was elevated at 1386.  His protein creatinine was 11.33.  ESR was also obtained and was elevated at 135.  Una boots were applied to help with LE edema.  His nausea had improved, but overall his oral intake was marginal.  He was supplemented with ensure and he was encouraged to eat what he was able.  He was sent for Thoracentesis on the left side.  They were able to remove 1000 ml of blood tinged fluid.  The fluid was cultured and showed ***.  Nephrology add bicarb to patient's medication regimen to treat acidosis.  They  also ordered workup for Nephritic/Neprhotic syndrome.  They felt patient may need a renal biopsy in the future which could be done on an outpatient basis.  The patient underwent right heart catheterization on 01/19/2021.  This showed ***.

## 2021-01-18 NOTE — Progress Notes (Addendum)
      [address]       [place] [address]             [phone]       Subjective:  Patient feeling a little better.  He isn't as nauseated as he has been.  He states that when he lays flat he notices an increase in "pressure" in his left chest with some shortness of breath.  + BM  Objective: Vital signs in last 24 hours: Temp:  [97.9 F (36.6 C)-98.7 F (37.1 C)] 98.7 F (37.1 C) (03/09 0449) Pulse Rate:  [74-88] 87 (03/09 0449) Cardiac Rhythm: Normal sinus rhythm (03/08 1945) Resp:  [18-19] 18 (03/09 0449) BP: (106-139)/(80-89) 113/82 (03/09 0449) SpO2:  [95 %-99 %] 97 % (03/09 0449) Weight:  [77.1 kg-77.3 kg] 77.3 kg (03/09 0449)  Intake/Output from previous day: 03/08 0701 - 03/09 0700 In: 1695.2 [P.O.:700; I.V.:995.2] Out: 1530 [Urine:1530]  General appearance: alert, cooperative and no distress Heart: regular rate and rhythm Lungs: diminished breath sounds bibasilar Abdomen: soft non-tender, mild distention, + BS Extremities: edema trace Wound: clean and dry  Lab Results: Recent Labs    01/16/21 1434 01/17/21 1842  WBC 11.1* 9.0  HGB 10.8* 10.4*  HCT 32.0* 29.1*  PLT 455* 442*   BMET:  Recent Labs    01/17/21 1842 01/18/21 0142  NA 132* 131*  K 4.6 6.1*  CL 105 107  CO2 20* 16*  GLUCOSE 123* 91  BUN 71* 70*  CREATININE 3.02* 2.70*  CALCIUM 7.6* 7.8*    PT/INR: No results for input(s): LABPROT, INR in the last 72 hours. ABG    Component Value Date/Time   PHART 7.323 (L) 01/05/2021 1834   HCO3 22.2 01/05/2021 1834   TCO2 23 01/05/2021 1834   ACIDBASEDEF 4.0 (H) 01/05/2021 1834   O2SAT 94.0 01/05/2021 1834   CBG (last 3)  No results for input(s): GLUCAP in the last 72 hours.  Assessment/Plan:  1. CV- S/P CABG x 4, + Hypertension persists both Systolic and Diastolic- on Lopressor, ? May benefit from Renal Ultrasound to assess for RAS which could be attributing to persistent hypertension.. with elevated creatinine will  hold off on restarting additional anti-hypertensive agents for now 2. Pulm- CXR with left pleural effusion, appears slightly increased this morning, will discuss Thoracentesis vs. CT of chest for further evaluation with Dr. Vickey Sages 3. AKI- likely due to dehydration, creatinine improved to 2.7, IV fluids remaining running at 100 per hr 4. Hyperkalemia-increased to 6.1, will start Lokelma if no response will need Kaexylate 5. GI- nausea improved, oral intake remains poor, continue ensure TID, encourage oral intake 6. Dispo- patient stable, feeling a little better this morning, care as mentioned above, AHF team has been consulted, will discuss possible Thoracentesis with Dr. Vickey Sages  Addendum: as discussed with Dr. Vickey Sages will obtain CT chest.  Renal Ultrasound due to persistent hypertension and will consult Nephrology for additional assistance.   LOS: 1 day   Lowella Dandy, PA-C 01/18/2021

## 2021-01-18 NOTE — Progress Notes (Signed)
Orthopedic Tech Progress Note Patient Details:  Rodney Strickland 1966-04-10 [id number] RN called for unna boots. Applied unna boots to BLE. Ortho Devices Type of Ortho Device: Radio broadcast assistant Ortho Device/Splint Location: BLE Ortho Device/Splint Interventions: Ordered,Application   Post Interventions Patient Tolerated: Well Instructions Provided: Care of device   Kerry Fort 01/18/2021, 5:19 PM
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
## 2021-01-19 ENCOUNTER — Inpatient Hospital Stay (HOSPITAL_COMMUNITY): Payer: Federal, State, Local not specified - PPO

## 2021-01-19 DIAGNOSIS — I10 Essential (primary) hypertension: Secondary | ICD-10-CM | POA: Diagnosis not present

## 2021-01-19 DIAGNOSIS — J90 Pleural effusion, not elsewhere classified: Secondary | ICD-10-CM | POA: Diagnosis not present

## 2021-01-19 DIAGNOSIS — I5032 Chronic diastolic (congestive) heart failure: Secondary | ICD-10-CM | POA: Diagnosis not present

## 2021-01-19 DIAGNOSIS — I2510 Atherosclerotic heart disease of native coronary artery without angina pectoris: Secondary | ICD-10-CM | POA: Diagnosis not present

## 2021-01-19 DIAGNOSIS — N179 Acute kidney failure, unspecified: Secondary | ICD-10-CM | POA: Diagnosis not present

## 2021-01-19 DIAGNOSIS — J948 Other specified pleural conditions: Secondary | ICD-10-CM | POA: Diagnosis not present

## 2021-01-19 DIAGNOSIS — I509 Heart failure, unspecified: Secondary | ICD-10-CM | POA: Diagnosis not present

## 2021-01-19 DIAGNOSIS — R918 Other nonspecific abnormal finding of lung field: Secondary | ICD-10-CM | POA: Diagnosis not present

## 2021-01-19 DIAGNOSIS — E875 Hyperkalemia: Secondary | ICD-10-CM | POA: Diagnosis not present

## 2021-01-19 HISTORY — PX: IR THORACENTESIS ASP PLEURAL SPACE W/IMG GUIDE: IMG5380

## 2021-01-19 LAB — LACTATE DEHYDROGENASE, PLEURAL OR PERITONEAL FLUID: LD, Fluid: 472 U/L — ABNORMAL HIGH (ref 3–23)

## 2021-01-19 LAB — BODY FLUID CELL COUNT WITH DIFFERENTIAL
Eos, Fluid: 1 %
Lymphs, Fluid: 59 %
Monocyte-Macrophage-Serous Fluid: 18 % — ABNORMAL LOW (ref 50–90)
Neutrophil Count, Fluid: 22 % (ref 0–25)
Total Nucleated Cell Count, Fluid: 2200 cu mm — ABNORMAL HIGH (ref 0–1000)

## 2021-01-19 LAB — CREATININE, URINE, 24 HOUR
Collection Interval-UCRE24: 24 hours
Creatinine, 24H Ur: 1115 mg/d (ref 800–2000)
Creatinine, Urine: 111.46 mg/dL
Urine Total Volume-UCRE24: 1000 mL

## 2021-01-19 LAB — RESP PANEL BY RT-PCR (FLU A&B, COVID) ARPGX2
Influenza A by PCR: NEGATIVE
Influenza B by PCR: NEGATIVE
SARS Coronavirus 2 by RT PCR: NEGATIVE

## 2021-01-19 LAB — GRAM STAIN

## 2021-01-19 LAB — BASIC METABOLIC PANEL
Anion gap: 6 (ref 5–15)
BUN: 62 mg/dL — ABNORMAL HIGH (ref 6–20)
CO2: 20 mmol/L — ABNORMAL LOW (ref 22–32)
Calcium: 7.3 mg/dL — ABNORMAL LOW (ref 8.9–10.3)
Chloride: 105 mmol/L (ref 98–111)
Creatinine, Ser: 2.35 mg/dL — ABNORMAL HIGH (ref 0.61–1.24)
GFR, Estimated: 32 mL/min — ABNORMAL LOW (ref >60)
Glucose, Bld: 102 mg/dL — ABNORMAL HIGH (ref 70–99)
Potassium: 4 mmol/L (ref 3.5–5.1)
Sodium: 131 mmol/L — ABNORMAL LOW (ref 135–145)

## 2021-01-19 LAB — GLUCOSE, PLEURAL OR PERITONEAL FLUID: Glucose, Fluid: 120 mg/dL

## 2021-01-19 LAB — PROTEIN / CREATININE RATIO, URINE
Creatinine, Urine: 122.34 mg/dL
Creatinine, Urine: 111.46 mg/dL
Protein Creatinine Ratio: 11.33 mg/mg{Cre} — ABNORMAL HIGH (ref 0.00–0.15)
Protein Creatinine Ratio: 9.58 mg/mg{Cre} — ABNORMAL HIGH (ref 0.00–0.15)
Total Protein, Urine: 1386 mg/dL
Total Protein, Urine: 1068 mg/dL

## 2021-01-19 LAB — ALBUMIN, PLEURAL OR PERITONEAL FLUID: Albumin, Fluid: <1 g/dL

## 2021-01-19 LAB — SEDIMENTATION RATE: Sed Rate: 135 mm/hr — ABNORMAL HIGH (ref 0–16)

## 2021-01-19 LAB — BRAIN NATRIURETIC PEPTIDE: B Natriuretic Peptide: 185.2 pg/mL — ABNORMAL HIGH (ref 0.0–100.0)

## 2021-01-19 LAB — PROTEIN, PLEURAL OR PERITONEAL FLUID: Total protein, fluid: <3 g/dL

## 2021-01-19 LAB — C-REACTIVE PROTEIN: CRP: 0.6 mg/dL (ref <1.0)

## 2021-01-19 LAB — HEPATITIS PANEL, ACUTE
HCV Ab: NONREACTIVE
Hep A IgM: NONREACTIVE
Hep B C IgM: NONREACTIVE
Hepatitis B Surface Ag: NONREACTIVE

## 2021-01-19 LAB — HIV ANTIBODY (ROUTINE TESTING W REFLEX): HIV Screen 4th Generation wRfx: NONREACTIVE

## 2021-01-19 MED ORDER — SODIUM CHLORIDE 0.9 % IV SOLN: 250.0000 mL | INTRAVENOUS | Status: DC | PRN

## 2021-01-19 MED ORDER — LIDOCAINE HCL 1 % IJ SOLN
INTRAMUSCULAR | Status: AC
  Filled 2021-01-19: qty 20

## 2021-01-19 MED ORDER — SODIUM BICARBONATE 650 MG PO TABS
650.0000 mg | ORAL_TABLET | Freq: Two times a day (BID) | ORAL | Status: DC
  Administered 2021-01-19 – 2021-01-22 (×6): 650 mg via ORAL
  Filled 2021-01-19 (×6): qty 1

## 2021-01-19 MED ORDER — ASPIRIN 81 MG PO CHEW
81.0000 mg | CHEWABLE_TABLET | ORAL | Status: AC
  Administered 2021-01-20: 81 mg via ORAL
  Filled 2021-01-19: qty 1

## 2021-01-19 MED ORDER — SODIUM CHLORIDE 0.9% FLUSH
3.0000 mL | Freq: Two times a day (BID) | INTRAVENOUS | Status: DC
Start: 2021-01-19 — End: 2021-01-22
  Administered 2021-01-21 – 2021-01-22 (×2): 3 mL via INTRAVENOUS

## 2021-01-19 MED ORDER — SODIUM CHLORIDE 0.9% FLUSH: 3.0000 mL | INTRAVENOUS | Status: DC | PRN

## 2021-01-19 MED ORDER — LIDOCAINE HCL 1 % IJ SOLN
INTRAMUSCULAR | Status: AC | PRN
Start: 2021-01-19 — End: 2021-01-19
  Administered 2021-01-19: 20 mL via INTRADERMAL

## 2021-01-19 MED ORDER — SODIUM CHLORIDE 0.9 % IV SOLN: INTRAVENOUS | Status: DC

## 2021-01-19 NOTE — Progress Notes (Signed)
Nephrology Follow-Up Consult note   Assessment/Recommendations: Rodney Strickland is a/an 55 y.o. male with a past medical history significant for gout, HTN, CAD s/p CABG rencently, CHF who presents with AKI and hyperkalemia  Improving nonoliguric AKI on CKD 3 a: Likely secondary to dehydration given nausea with some intrinsic renal injury possibly with contributions from lisinopril.  Patient is improving at this time.  Now appears volume overloaded -Creatinine continues to improve slowly -Hold diuretics for now -Urinalysis with hematuria and proteinuria, work-up as below -Continue to monitor daily Cr, Dose meds for GFR -Monitor Daily I/Os, Daily weight  -Maintain MAP>65 for optimal renal perfusion.  -Avoid nephrotoxic medications including NSAIDs and Vanc/Zosyn combo -Currently no indication for HD  Proteinuria/hematuria: Present on urinalysis.  Could just be from AKI but proteinuria seems higher than expected.  His urinalysis was completely bland in January but this developed on 2/22 with urinalysis demonstrating proteinuria and hematuria at that time as well.  Unclear cause at this time but glomerular disease is possible -Extensive nephritic and nephrotic syndrome work-up sent -Follow-up quantification of urine protein -Could consider biopsy but ideally would do this outpatient; providing with extensive therapy such as cytotoxic therapies and high-dose steroids could be deleterious to his recovery otherwise  Hyperkalemia: Associated with AKI and acidosis.    Significantly improved with Lokelma.  Can stop Lokelma today  Metabolic acidosis: Likely associated with AKI.    Greatly improved.  Changed to bicarb 650 mg twice daily  Pleural effusion: Moderate in size on left side.  Present on CT scan.  Likely related to volume overload.  Could consider drainage per primary team  Recent CABG: CT surgery following.  Appreciate help  Hypertension: Blood pressure well controlled at this time.   Possibly higher outpatient.  Continue metoprolol twice daily.  Agree with holding lisinopril for now  CHF: EF normal.    Has diastolic dysfunction.  Heart failure team following.  Plan for right heart catheterization tomorrow.  Appreciate help   Recommendations conveyed to primary service.    Darnell Level [facility] 01/19/2021 1:23 PM  ___________________________________________________________  CC: AKI on CKD  Interval History/Subjective: Patient feels a little bit better today.  Urine output has been okay but not robust.  Some vague left-sided chest pain.  No other significant complaints   Medications:  Current Facility-Administered Medications  Medication Dose Route Frequency Provider Last Rate Last Admin  . acetaminophen (TYLENOL) tablet 650 mg  650 mg Oral Q6H PRN Barrett, Erin R, PA-C      . aspirin EC tablet 81 mg  81 mg Oral Daily Barrett, Erin R, PA-C   81 mg at 01/19/21 1004  . docusate sodium (COLACE) capsule 100 mg  100 mg Oral BID PRN Barrett, Erin R, PA-C      . enoxaparin (LOVENOX) injection 30 mg  30 mg Subcutaneous Q24H Barrett, Erin R, PA-C   30 mg at 01/17/21 1727  . feeding supplement (ENSURE ENLIVE / ENSURE PLUS) liquid 237 mL  237 mL Oral TID WC Barrett, Erin R, PA-C   237 mL at 01/18/21 1724  . icosapent Ethyl (VASCEPA) 1 g capsule 2 g  2 g Oral BID Robbie Lis M, PA-C   2 g at 01/19/21 1003  . loratadine (CLARITIN) tablet 10 mg  10 mg Oral Daily PRN Barrett, Erin R, PA-C      . metoprolol tartrate (LOPRESSOR) tablet 25 mg  25 mg Oral BID Barrett, Erin R, PA-C   25 mg at  01/19/21 1003  . ondansetron (ZOFRAN) injection 4 mg  4 mg Intravenous Q6H PRN Barrett, Erin R, PA-C      . oxyCODONE (Oxy IR/ROXICODONE) immediate release tablet 5 mg  5 mg Oral Q4H PRN Barrett, Erin R, PA-C      . polyethylene glycol (MIRALAX / GLYCOLAX) packet 17 g  17 g Oral Daily PRN Barrett, Erin R, PA-C      . rosuvastatin (CRESTOR) tablet 20 mg  20 mg Oral  Daily Robbie Lis M, PA-C   20 mg at 01/19/21 1003  . sodium bicarbonate tablet 1,300 mg  1,300 mg Oral TID Darnell Level, MD   1,300 mg at 01/19/21 1003  . sodium chloride flush (NS) 0.9 % injection 3 mL  3 mL Intravenous Q12H Clegg, Amy D, NP          Review of Systems: 10 systems reviewed and negative except per interval history/subjective  Physical Exam: Vitals:   01/19/21 0131 01/19/21 0455  BP: 127/81 123/81  Pulse: 80 86  Resp:    Temp:  98.3 F (36.8 C)  SpO2: 97% 99%   No intake/output data recorded.  Intake/Output Summary (Last 24 hours) at 01/19/2021 1323 Last data filed at 01/19/2021 0500 Gross per 24 hour  Intake 560 ml  Output 430 ml  Net 130 ml   Constitutional: Tired but no distress, sitting in chair ENMT: ears and nose without scars or lesions, MMM CV: normal rate, 1+ pitting edema in the bilateral lower extremities Respiratory: clear to auscultation anteriorly with no increased work of breathing Gastrointestinal: soft, non-tender, no palpable masses or hernias Skin: Surgical scar on left forearm and chest, otherwise no visible lesions or rashes Psych: alert, judgement/insight appropriate, appropriate mood and affect   Test Results I personally reviewed new and old clinical labs and radiology tests Lab Results  Component Value Date   NA 131 (L) 01/19/2021   K 4.0 01/19/2021   CL 105 01/19/2021   CO2 20 (L) 01/19/2021   BUN 62 (H) 01/19/2021   CREATININE 2.35 (H) 01/19/2021   CALCIUM 7.3 (L) 01/19/2021   ALBUMIN 1.8 (L) 01/03/2021

## 2021-01-19 NOTE — Progress Notes (Incomplete)
Pharmacy Rounding Learning Note - not an active part of the chart  PMH: stage 2 CKD, s/p CAGB x4 (12/2020), HTN, HLD   CC/HPI: Pt returned to hospital on 3/8 with complaints of weakness and SOB. Pt just had a CABG placed and returned to the ED shortly after discharge on 3/1.    Problem 1: Possible nephrotic syndrome - urinalysis revealed >= 300 mg/dL of protein and hematuria in the urine on 3/9 - glomerular serologies ordered - lisinopril, furosemide, and colchicine on hold - MD will likely consider biopsy in the future  - If pt still admitted on Monday (3/14), could do a biopsy, or could arrange for outpatient biopsy if kidney function is stable or improving    Problem 2: Chronic diastolic HF - ECHO on 3/8 revealed LVEF 60 to 65%, normal function, normal RV - pt is undergoing cardiac catheterization today (3/11)  Problem 3: HTN/CAD - pt BP 112/79 to 142/89 from 3/10 to 3/11 - pt currently taking aspirin 81 mg daily, metoprolol tartrate, and rosuvastatin   Problem 4:  -    Renal:  Cause of AKI: likely d/t dehydration given nausea with intrinsic renal injury (possibly from lisinopril)  SCr: baseline 1.1 to 1.5 after surgery in 12/2020  3/8: 3.02  3/9: 2.70  3/9: 2.39  3/10: 2.35  3/11: 2.43  Dialysis: none currently  Type:    Schedule:    Access type:   Days:   Tolerating?  Urine output: (no Foley cath noted)  3/9: 430 mL urine  3/10: 0 mL urine  3/11: none noted yet  Electrolytes: 3/11  Sodium: 131 (low)  Potassium: 4.0  Calcium: 7.1 (low), corrected calcium   Magnesium: 2.8 (2/25)  Phosphorus:   Hgb: 10.1 (3/11, low) Ferritin:  Iron saturation:   Renal meds:  - none    Plan:  - follow up with glomerular serologies - continue to monitor SCr, electrolytes, and hemoglobin - pt is undergoing cardiac catheterization today 3/11 - keep an eye on any dye use for him (such as in cath lab), this could cause SCr to increase, should be able to see if they  used any dye or not

## 2021-01-19 NOTE — H&P (View-Only) (Signed)
Patient ID: Rodney Strickland, male   DOB: [birth year], 55 y.o.   MRN: [id number]     Advanced Heart Failure Rounding Note  PCP-Cardiologist: No primary care provider on file.   Subjective:    Creatinine lower at 2.35 today.  ESR noted to be markedly elevated at 135.  He is in NSR in 90s.  Unna boots on.    Objective:   Weight Range: 78.4 kg Body mass index is 27.91 kg/m.   Vital Signs:   Temp:  [97.9 F (36.6 C)-98.3 F (36.8 C)] 98.3 F (36.8 C) (03/10 0455) Pulse Rate:  [80-93] 86 (03/10 0455) Resp:  [16-17] 16 (03/09 1932) BP: (123-147)/(81-95) 123/81 (03/10 0455) SpO2:  [97 %-99 %] 99 % (03/10 0455) Weight:  [78.4 kg] 78.4 kg (03/10 0455) Last BM Date: 01/18/21  Weight change: Filed Weights   01/17/21 1551 01/18/21 0449 01/19/21 0455  Weight: 77.1 kg 77.3 kg 78.4 kg    Intake/Output:   Intake/Output Summary (Last 24 hours) at 01/19/2021 0948 Last data filed at 01/19/2021 0500 Gross per 24 hour  Intake 560 ml  Output 430 ml  Net 130 ml      Physical Exam    General:  Well appearing. No resp difficulty HEENT: Normal Neck: Supple. JVP not elevated. Carotids 2+ bilat; no bruits. No lymphadenopathy or thyromegaly appreciated. Cor: PMI nondisplaced. Regular rate & rhythm. No rubs, gallops or murmurs. Lungs: Clear Abdomen: Soft, nontender, nondistended. No hepatosplenomegaly. No bruits or masses. Good bowel sounds. Extremities: No cyanosis, clubbing, rash. 2+ edema to knees and edema of forearms Neuro: Alert & orientedx3, cranial nerves grossly intact. moves all 4 extremities w/o difficulty. Affect pleasant   Telemetry   NSR 90s (personally reviewed).   Labs    CBC Recent Labs    01/16/21 1434 01/17/21 1842  WBC 11.1* 9.0  NEUTROABS 8,614*  --   HGB 10.8* 10.4*  HCT 32.0* 29.1*  MCV 89.9 88.4  PLT 455* 417*   Basic Metabolic Panel Recent Labs    01/18/21 1309 01/19/21 0253  NA 130* 131*  K 5.5* 4.0  CL 107 105  CO2 16* 20*  GLUCOSE 101*  102*  BUN 66* 62*  CREATININE 2.39* 2.35*  CALCIUM 7.5* 7.3*   Liver Function Tests No results for input(s): AST, ALT, ALKPHOS, BILITOT, PROT, ALBUMIN in the last 72 hours. No results for input(s): LIPASE, AMYLASE in the last 72 hours. Cardiac Enzymes No results for input(s): CKTOTAL, CKMB, CKMBINDEX, TROPONINI in the last 72 hours.  BNP: BNP (last 3 results) Recent Labs    01/19/21 0253  BNP 185.2*    ProBNP (last 3 results) No results for input(s): PROBNP in the last 8760 hours.   D-Dimer No results for input(s): DDIMER in the last 72 hours. Hemoglobin A1C No results for input(s): HGBA1C in the last 72 hours. Fasting Lipid Panel No results for input(s): CHOL, HDL, LDLCALC, TRIG, CHOLHDL, LDLDIRECT in the last 72 hours. Thyroid Function Tests No results for input(s): TSH, T4TOTAL, T3FREE, THYROIDAB in the last 72 hours.  Invalid input(s): FREET3  Other results:   Imaging    CT chest without contrast  Result Date: 01/18/2021 CLINICAL DATA:  Pleural effusion suspected. EXAM: CT CHEST WITHOUT CONTRAST TECHNIQUE: Multidetector CT imaging of the chest was performed following the standard protocol without IV contrast. COMPARISON:  Radiographs 01/18/2021 and 01/16/2021. Cardiac CT 12/23/2020 and chest CTA 11/17/2016. FINDINGS: Cardiovascular: Since the prior CTs, the patient has undergone interval median sternotomy and CABG. Atherosclerosis  of the aorta, great vessels and coronary arteries noted. The heart size is stable. There is no pericardial effusion. Mediastinum/Nodes: There are no enlarged mediastinal, hilar or axillary lymph nodes.No evidence of mediastinal hematoma, although there is a probable extrapleural hematoma anteriorly in the right hemithorax which demonstrates heterogeneous areas of high density. This hematoma measures approximately 12.8 x 3.9 x 8.8 cm. The thyroid gland, trachea and esophagus demonstrate no significant findings. Lungs/Pleura: As demonstrated  radiographically, there is a moderate-sized pleural effusion on the left which measures water density and is predominately dependent. There is a trace right pleural effusion. As above, there is an extrapleural hematoma anteriorly in the right hemithorax. No pneumothorax. There is subtotal collapse of the left lower lobe attributed to the pleural effusion. No endobronchial lesion identified. Mild compressive atelectasis anteriorly in the right upper and middle lobes. Stable biapical scarring. Upper abdomen: No acute findings within the visualized upper abdomen. Previous cholecystectomy. Musculoskeletal/Chest wall: Status post median sternotomy without complication. No evidence of acute fracture. There is generalized edema throughout the subcutaneous tissues of the lower chest and upper abdomen without focal fluid collection. IMPRESSION: 1. Interval median sternotomy and CABG. 2. Probable extrapleural hematoma anteriorly in the right hemithorax, possibly related to a bleeding internal mammary artery. No significant mediastinal hematoma. 3. Moderate-sized left pleural effusion with subtotal collapse of the left lower lobe. Trace right pleural effusion. 4. Aortic Atherosclerosis (ICD10-I70.0). Electronically Signed   By: Richardean Sale M.D.   On: 01/18/2021 14:03   US RENAL  Result Date: 01/18/2021 CLINICAL DATA:  Renal insufficiency.  CABG 2 weeks ago. EXAM: RENAL / URINARY TRACT ULTRASOUND COMPLETE COMPARISON:  CTA of the chest, abdomen, and pelvis 11/17/16 FINDINGS: Right Kidney: Renal measurements: 10.5 x 4.4 x 5.5 cm = volume: 137 mL. Echogenicity within normal limits. No mass or hydronephrosis visualized. Left Kidney: Renal measurements: 13.1 x 5.7 x 6.1 cm = volume: 236 mL. Echogenicity within normal limits. No mass or hydronephrosis visualized. Bladder: Appears normal for degree of bladder distention. Ureteral jets were not visualized Other: None. IMPRESSION: Normal sonographic appearance of the kidneys  bilaterally. Electronically Signed   By: San Morelle M.D.   On: 01/18/2021 14:58      Medications:     Scheduled Medications: . aspirin EC  81 mg Oral Daily  . enoxaparin (LOVENOX) injection  30 mg Subcutaneous Q24H  . feeding supplement  237 mL Oral TID WC  . icosapent Ethyl  2 g Oral BID  . metoprolol tartrate  25 mg Oral BID  . rosuvastatin  20 mg Oral Daily  . sodium bicarbonate  1,300 mg Oral TID  . sodium chloride flush  3 mL Intravenous Q12H     Infusions:   PRN Medications:  acetaminophen, docusate sodium, loratadine, ondansetron (ZOFRAN) IV, oxyCODONE, polyethylene glycol   Assessment/Plan   1. AKI: Baseline creatinine around 1.3, creatinine 3.21 at admission.  He had been getting Lasix 80 mg daily due to significant lower extremity edema.  Hypoalbuminemia (albumin 1.8) noted with proteinuria, I wonder if this is a nephrotic syndrome picture with peripheral edema due to low oncotic pressure rather than CHF, and patient developed AKI due to aggressive Lasix regimen + lisinopril. Creatinine down to 2.35 today. He is not a diabetic. ESR is elevated, which can be seen with nephrotic syndrome.  - Multiple serologies have been sent as well as SPEP/UPEP.  - Hold Lasix and lisinopril.  - Waiting for spot urine protein/creatinine ratio.  Getting 24 hr urine protein and  creatinine collection.  - Nephrology following.  - With proteinuria, would eventually want to restart ACEI/ARB.  2. Chronic diastolic CHF: Echo this admission with EF 60-65%, normal RV, normal IVC.  No JVD.  He has significant lower extremity edema, but as above, I suspect this is due to low oncotic pressure/nephrotic syndrome with hypoalbuminemia rather than CHF.   - Hold off on Lasix for now.  - Place Unna boots.  - Discussed RHC tomorrow with his wife to assess filling pressures.  My suspicion is that they are not high and that peripheral edema is more due to low oncotic pressure.  3. CAD: s/p CABG  in 2/22.  - Increase Crestor to 20 mg daily.  - Continue ASA - Add vascepa with elevated triglycerides.  4. Pleural effusion: Moderate on left.  - Will arrange for IR-guided drainage today.  5. Extrapleural hematoma: On right, post-op.  Hgb stable so suspect not actively bleeding into this. However, with elevated ESR would also be concerned about infection of hematoma.  - Management per TCTS, ?need IR to obtain sample for culture.  6. Post-op pericarditis/post-pericardiotomy syndrome: No further chest pain.  - Stopped colchicine with AKI.  7. Hyperkalemia: Due to AKI.  Resolved. - Stopped lisinopril.  - Got Lokelma.   Length of Stay: 2  Loralie Champagne, MD  01/19/2021, 9:48 AM  Advanced Heart Failure Team Pager [phone] (M-F; 7a - 5p)  Please contact [facility] for night-coverage after hours (4p -7a ) and weekends on [email or web address]

## 2021-01-19 NOTE — Procedures (Signed)
PROCEDURE SUMMARY:  Successful US guided left thoracentesis. Yielded 1 L of blood-tinged fluid. Pt tolerated procedure well. No immediate complications.  Specimen sent for labs. CXR ordered; no post-procedure pneumothorax identified.   EBL < 5 mL  Mickie Kay, NP 01/19/2021 3:14 PM

## 2021-01-19 NOTE — Progress Notes (Signed)
      [address]       [place] [address]             [phone]      Subjective:  Patient feels a little better this morning.  He had some nausea w/o vomiting this morning.  He and his wife notice he is a little more swollen this morning.  He continues to have some shortness of breath  Objective: Vital signs in last 24 hours: Temp:  [97.9 F (36.6 C)-98.3 F (36.8 C)] 98.3 F (36.8 C) (03/10 0455) Pulse Rate:  [80-93] 86 (03/10 0455) Cardiac Rhythm: Normal sinus rhythm (03/09 1952) Resp:  [16-17] 16 (03/09 1932) BP: (123-147)/(81-95) 123/81 (03/10 0455) SpO2:  [97 %-99 %] 99 % (03/10 0455) Weight:  [78.4 kg] 78.4 kg (03/10 0455)  Intake/Output from previous day: 03/09 0701 - 03/10 0700 In: 560 [P.O.:560] Out: 430 [Urine:430]  General appearance: alert, cooperative and no distress Heart: regular rate and rhythm Lungs: diminished breath sounds left base Abdomen: soft, non-tender; bowel sounds normal; no masses,  no organomegaly Extremities: edema wide spread, increase in bilateral hands today, una boots in place Wound: clean, mild serous drainage present  Lab Results: Recent Labs    01/16/21 1434 01/17/21 1842  WBC 11.1* 9.0  HGB 10.8* 10.4*  HCT 32.0* 29.1*  PLT 455* 442*   BMET:  Recent Labs    01/18/21 1309 01/19/21 0253  NA 130* 131*  K 5.5* 4.0  CL 107 105  CO2 16* 20*  GLUCOSE 101* 102*  BUN 66* 62*  CREATININE 2.39* 2.35*  CALCIUM 7.5* 7.3*    PT/INR: No results for input(s): LABPROT, INR in the last 72 hours. ABG    Component Value Date/Time   PHART 7.323 (L) 01/05/2021 1834   HCO3 22.2 01/05/2021 1834   TCO2 23 01/05/2021 1834   ACIDBASEDEF 4.0 (H) 01/05/2021 1834   O2SAT 94.0 01/05/2021 1834   CBG (last 3)  No results for input(s): GLUCAP in the last 72 hours.  Assessment/Plan:  1. CV- NSR, BP elevated at times- on Lopressor... AHF following, currently working patient up for nephrotic syndrome 2. Pulm- left  pleural effusion with near complete collapse of LLL, IR thoracentesis order placed, will hopefully be done today 3. Renal- creatinine is slightly improved to 2.35, UA shows some protein, glucose and red cells present, 24 Urine collection in process, Nephrology is following 4. Hyperkalemia- improved down to 4.0 after [facility], this has been stopped 5. Right chest wall hematoma- will discuss with Dr. Vickey Sages possible placement of IR drain 6. GI- nausea at times, oral intake remains poor, continue supplements TID 7. Dispo- patient is stable, creatinine has improved, K has improved, currently being worked up for Nephrotic syndrome, AHF/Nephrology following, IR Thoracentesis on left hopefully to be done today, continue current care   LOS: 2 days   Lowella Dandy, PA-C 01/19/2021

## 2021-01-19 NOTE — Progress Notes (Signed)
CARDIAC REHAB PHASE I   PRE:  Rate/Rhythm: 103 ST    BP: sitting 128/81    SaO2: 100 RA  MODE:  Ambulation: 100 ft in hall   POST:  Rate/Rhythm: 106 ST    BP: sitting 127/84     SaO2: 98 RA  Pt in BR on arrival, fatigued after cleaning up and rested on EOB for few minutes. HR max 110 ST in BR. Pt with shuffled steps in room without RW but once in hall longer, more normal strides with RW. SOB is main c/o but also generally fatigued. To recliner, legs elevated. VSS. Got new IS, 750 mL max. Highly encouraged oral intake, which has been difficult. Wife present, can walk with him more today. [phone]  Harriet Masson CES, ACSM 01/19/2021 10:03 AM

## 2021-01-19 NOTE — Progress Notes (Signed)
Patient ID: Rodney Strickland, male   DOB: 08/25/1966, 55 y.o.   MRN: [id number]     Advanced Heart Failure Rounding Note  PCP-Cardiologist: No primary care provider on file.   Subjective:    Creatinine lower at 2.35 today.  ESR noted to be markedly elevated at 135.  He is in NSR in 90s.  Unna boots on.    Objective:   Weight Range: 78.4 kg Body mass index is 27.91 kg/m.   Vital Signs:   Temp:  [97.9 F (36.6 C)-98.3 F (36.8 C)] 98.3 F (36.8 C) (03/10 0455) Pulse Rate:  [80-93] 86 (03/10 0455) Resp:  [16-17] 16 (03/09 1932) BP: (123-147)/(81-95) 123/81 (03/10 0455) SpO2:  [97 %-99 %] 99 % (03/10 0455) Weight:  [78.4 kg] 78.4 kg (03/10 0455) Last BM Date: 01/18/21  Weight change: Filed Weights   01/17/21 1551 01/18/21 0449 01/19/21 0455  Weight: 77.1 kg 77.3 kg 78.4 kg    Intake/Output:   Intake/Output Summary (Last 24 hours) at 01/19/2021 0948 Last data filed at 01/19/2021 0500 Gross per 24 hour  Intake 560 ml  Output 430 ml  Net 130 ml      Physical Exam    General:  Well appearing. No resp difficulty HEENT: Normal Neck: Supple. JVP not elevated. Carotids 2+ bilat; no bruits. No lymphadenopathy or thyromegaly appreciated. Cor: PMI nondisplaced. Regular rate & rhythm. No rubs, gallops or murmurs. Lungs: Clear Abdomen: Soft, nontender, nondistended. No hepatosplenomegaly. No bruits or masses. Good bowel sounds. Extremities: No cyanosis, clubbing, rash. 2+ edema to knees and edema of forearms Neuro: Alert & orientedx3, cranial nerves grossly intact. moves all 4 extremities w/o difficulty. Affect pleasant   Telemetry   NSR 90s (personally reviewed).   Labs    CBC Recent Labs    01/16/21 1434 01/17/21 1842  WBC 11.1* 9.0  NEUTROABS 8,614*  --   HGB 10.8* 10.4*  HCT 32.0* 29.1*  MCV 89.9 88.4  PLT 455* 322*   Basic Metabolic Panel Recent Labs    01/18/21 1309 01/19/21 0253  NA 130* 131*  K 5.5* 4.0  CL 107 105  CO2 16* 20*  GLUCOSE 101*  102*  BUN 66* 62*  CREATININE 2.39* 2.35*  CALCIUM 7.5* 7.3*   Liver Function Tests No results for input(s): AST, ALT, ALKPHOS, BILITOT, PROT, ALBUMIN in the last 72 hours. No results for input(s): LIPASE, AMYLASE in the last 72 hours. Cardiac Enzymes No results for input(s): CKTOTAL, CKMB, CKMBINDEX, TROPONINI in the last 72 hours.  BNP: BNP (last 3 results) Recent Labs    01/19/21 0253  BNP 185.2*    ProBNP (last 3 results) No results for input(s): PROBNP in the last 8760 hours.   D-Dimer No results for input(s): DDIMER in the last 72 hours. Hemoglobin A1C No results for input(s): HGBA1C in the last 72 hours. Fasting Lipid Panel No results for input(s): CHOL, HDL, LDLCALC, TRIG, CHOLHDL, LDLDIRECT in the last 72 hours. Thyroid Function Tests No results for input(s): TSH, T4TOTAL, T3FREE, THYROIDAB in the last 72 hours.  Invalid input(s): FREET3  Other results:   Imaging    CT chest without contrast  Result Date: 01/18/2021 CLINICAL DATA:  Pleural effusion suspected. EXAM: CT CHEST WITHOUT CONTRAST TECHNIQUE: Multidetector CT imaging of the chest was performed following the standard protocol without IV contrast. COMPARISON:  Radiographs 01/18/2021 and 01/16/2021. Cardiac CT 12/23/2020 and chest CTA 11/17/2016. FINDINGS: Cardiovascular: Since the prior CTs, the patient has undergone interval median sternotomy and CABG. Atherosclerosis  of the aorta, great vessels and coronary arteries noted. The heart size is stable. There is no pericardial effusion. Mediastinum/Nodes: There are no enlarged mediastinal, hilar or axillary lymph nodes.No evidence of mediastinal hematoma, although there is a probable extrapleural hematoma anteriorly in the right hemithorax which demonstrates heterogeneous areas of high density. This hematoma measures approximately 12.8 x 3.9 x 8.8 cm. The thyroid gland, trachea and esophagus demonstrate no significant findings. Lungs/Pleura: As demonstrated  radiographically, there is a moderate-sized pleural effusion on the left which measures water density and is predominately dependent. There is a trace right pleural effusion. As above, there is an extrapleural hematoma anteriorly in the right hemithorax. No pneumothorax. There is subtotal collapse of the left lower lobe attributed to the pleural effusion. No endobronchial lesion identified. Mild compressive atelectasis anteriorly in the right upper and middle lobes. Stable biapical scarring. Upper abdomen: No acute findings within the visualized upper abdomen. Previous cholecystectomy. Musculoskeletal/Chest wall: Status post median sternotomy without complication. No evidence of acute fracture. There is generalized edema throughout the subcutaneous tissues of the lower chest and upper abdomen without focal fluid collection. IMPRESSION: 1. Interval median sternotomy and CABG. 2. Probable extrapleural hematoma anteriorly in the right hemithorax, possibly related to a bleeding internal mammary artery. No significant mediastinal hematoma. 3. Moderate-sized left pleural effusion with subtotal collapse of the left lower lobe. Trace right pleural effusion. 4. Aortic Atherosclerosis (ICD10-I70.0). Electronically Signed   By: Richardean Sale M.D.   On: 01/18/2021 14:03   US RENAL  Result Date: 01/18/2021 CLINICAL DATA:  Renal insufficiency.  CABG 2 weeks ago. EXAM: RENAL / URINARY TRACT ULTRASOUND COMPLETE COMPARISON:  CTA of the chest, abdomen, and pelvis 11/17/16 FINDINGS: Right Kidney: Renal measurements: 10.5 x 4.4 x 5.5 cm = volume: 137 mL. Echogenicity within normal limits. No mass or hydronephrosis visualized. Left Kidney: Renal measurements: 13.1 x 5.7 x 6.1 cm = volume: 236 mL. Echogenicity within normal limits. No mass or hydronephrosis visualized. Bladder: Appears normal for degree of bladder distention. Ureteral jets were not visualized Other: None. IMPRESSION: Normal sonographic appearance of the kidneys  bilaterally. Electronically Signed   By: San Morelle M.D.   On: 01/18/2021 14:58      Medications:     Scheduled Medications: . aspirin EC  81 mg Oral Daily  . enoxaparin (LOVENOX) injection  30 mg Subcutaneous Q24H  . feeding supplement  237 mL Oral TID WC  . icosapent Ethyl  2 g Oral BID  . metoprolol tartrate  25 mg Oral BID  . rosuvastatin  20 mg Oral Daily  . sodium bicarbonate  1,300 mg Oral TID  . sodium chloride flush  3 mL Intravenous Q12H     Infusions:   PRN Medications:  acetaminophen, docusate sodium, loratadine, ondansetron (ZOFRAN) IV, oxyCODONE, polyethylene glycol   Assessment/Plan   1. AKI: Baseline creatinine around 1.3, creatinine 3.21 at admission.  He had been getting Lasix 80 mg daily due to significant lower extremity edema.  Hypoalbuminemia (albumin 1.8) noted with proteinuria, I wonder if this is a nephrotic syndrome picture with peripheral edema due to low oncotic pressure rather than CHF, and patient developed AKI due to aggressive Lasix regimen + lisinopril. Creatinine down to 2.35 today. He is not a diabetic. ESR is elevated, which can be seen with nephrotic syndrome.  - Multiple serologies have been sent as well as SPEP/UPEP.  - Hold Lasix and lisinopril.  - Waiting for spot urine protein/creatinine ratio.  Getting 24 hr urine protein and  creatinine collection.  - Nephrology following.  - With proteinuria, would eventually want to restart ACEI/ARB.  2. Chronic diastolic CHF: Echo this admission with EF 60-65%, normal RV, normal IVC.  No JVD.  He has significant lower extremity edema, but as above, I suspect this is due to low oncotic pressure/nephrotic syndrome with hypoalbuminemia rather than CHF.   - Hold off on Lasix for now.  - Place Unna boots.  - Discussed RHC tomorrow with his wife to assess filling pressures.  My suspicion is that they are not high and that peripheral edema is more due to low oncotic pressure.  3. CAD: s/p CABG  in 2/22.  - Increase Crestor to 20 mg daily.  - Continue ASA - Add vascepa with elevated triglycerides.  4. Pleural effusion: Moderate on left.  - Will arrange for IR-guided drainage today.  5. Extrapleural hematoma: On right, post-op.  Hgb stable so suspect not actively bleeding into this. However, with elevated ESR would also be concerned about infection of hematoma.  - Management per TCTS, ?need IR to obtain sample for culture.  6. Post-op pericarditis/post-pericardiotomy syndrome: No further chest pain.  - Stopped colchicine with AKI.  7. Hyperkalemia: Due to AKI.  Resolved. - Stopped lisinopril.  - Got Lokelma.   Length of Stay: 2  Loralie Champagne, MD  01/19/2021, 9:48 AM  Advanced Heart Failure Team Pager [phone] (M-F; 7a - 5p)  Please contact [facility] for night-coverage after hours (4p -7a ) and weekends on [email or web address]

## 2021-01-20 ENCOUNTER — Encounter (HOSPITAL_COMMUNITY): Payer: Self-pay | Admitting: Cardiology

## 2021-01-20 ENCOUNTER — Inpatient Hospital Stay (HOSPITAL_COMMUNITY)
Admission: AD | Disposition: A | Discharge status: Home / Self Care | Payer: Self-pay | Source: Ambulatory Visit | Attending: Cardiothoracic Surgery

## 2021-01-20 DIAGNOSIS — I10 Essential (primary) hypertension: Secondary | ICD-10-CM | POA: Diagnosis not present

## 2021-01-20 DIAGNOSIS — R601 Generalized edema: Secondary | ICD-10-CM | POA: Diagnosis not present

## 2021-01-20 DIAGNOSIS — I2510 Atherosclerotic heart disease of native coronary artery without angina pectoris: Secondary | ICD-10-CM | POA: Diagnosis not present

## 2021-01-20 DIAGNOSIS — E875 Hyperkalemia: Secondary | ICD-10-CM | POA: Diagnosis not present

## 2021-01-20 DIAGNOSIS — N179 Acute kidney failure, unspecified: Secondary | ICD-10-CM | POA: Diagnosis not present

## 2021-01-20 DIAGNOSIS — I5032 Chronic diastolic (congestive) heart failure: Secondary | ICD-10-CM | POA: Diagnosis not present

## 2021-01-20 DIAGNOSIS — I509 Heart failure, unspecified: Secondary | ICD-10-CM | POA: Diagnosis not present

## 2021-01-20 DIAGNOSIS — J90 Pleural effusion, not elsewhere classified: Secondary | ICD-10-CM | POA: Diagnosis not present

## 2021-01-20 HISTORY — PX: RIGHT HEART CATH: CATH118263

## 2021-01-20 LAB — PROTEIN ELECTROPHORESIS, SERUM
A/G Ratio: 0.5 — ABNORMAL LOW (ref 0.7–1.7)
Albumin ELP: 1.4 g/dL — ABNORMAL LOW (ref 2.9–4.4)
Alpha-1-Globulin: 0.2 g/dL (ref 0.0–0.4)
Alpha-2-Globulin: 0.6 g/dL (ref 0.4–1.0)
Beta Globulin: 1.2 g/dL (ref 0.7–1.3)
Gamma Globulin: 0.4 g/dL (ref 0.4–1.8)
Globulin, Total: 2.6 g/dL (ref 2.2–3.9)
Total Protein ELP: 4 g/dL — ABNORMAL LOW (ref 6.0–8.5)

## 2021-01-20 LAB — POCT I-STAT EG7
Acid-base deficit: 3 mmol/L — ABNORMAL HIGH (ref 0.0–2.0)
Acid-base deficit: 4 mmol/L — ABNORMAL HIGH (ref 0.0–2.0)
Bicarbonate: 21.6 mmol/L (ref 20.0–28.0)
Bicarbonate: 20.7 mmol/L (ref 20.0–28.0)
Calcium, Ion: 1.11 mmol/L — ABNORMAL LOW (ref 1.15–1.40)
Calcium, Ion: 1.07 mmol/L — ABNORMAL LOW (ref 1.15–1.40)
HCT: 24 % — ABNORMAL LOW (ref 39.0–52.0)
HCT: 24 % — ABNORMAL LOW (ref 39.0–52.0)
Hemoglobin: 8.2 g/dL — ABNORMAL LOW (ref 13.0–17.0)
Hemoglobin: 8.2 g/dL — ABNORMAL LOW (ref 13.0–17.0)
O2 Saturation: 64 %
O2 Saturation: 66 %
Potassium: 4.4 mmol/L (ref 3.5–5.1)
Potassium: 4.3 mmol/L (ref 3.5–5.1)
Sodium: 134 mmol/L — ABNORMAL LOW (ref 135–145)
Sodium: 135 mmol/L (ref 135–145)
TCO2: 23 mmol/L (ref 22–32)
TCO2: 22 mmol/L (ref 22–32)
pCO2, Ven: 34.8 mmHg — ABNORMAL LOW (ref 44.0–60.0)
pCO2, Ven: 33.9 mmHg — ABNORMAL LOW (ref 44.0–60.0)
pH, Ven: 7.401 (ref 7.250–7.430)
pH, Ven: 7.393 (ref 7.250–7.430)
pO2, Ven: 33 mmHg (ref 32.0–45.0)
pO2, Ven: 34 mmHg (ref 32.0–45.0)

## 2021-01-20 LAB — CBC
HCT: 28.4 % — ABNORMAL LOW (ref 39.0–52.0)
Hemoglobin: 10.1 g/dL — ABNORMAL LOW (ref 13.0–17.0)
MCH: 30.8 pg (ref 26.0–34.0)
MCHC: 35.6 g/dL (ref 30.0–36.0)
MCV: 86.6 fL (ref 80.0–100.0)
Platelets: 453 10*3/uL — ABNORMAL HIGH (ref 150–400)
RBC: 3.28 MIL/uL — ABNORMAL LOW (ref 4.22–5.81)
RDW: 13.2 % (ref 11.5–15.5)
WBC: 8.2 10*3/uL (ref 4.0–10.5)
nRBC: 0 % (ref 0.0–0.2)

## 2021-01-20 LAB — BASIC METABOLIC PANEL
Anion gap: 6 (ref 5–15)
BUN: 61 mg/dL — ABNORMAL HIGH (ref 6–20)
CO2: 21 mmol/L — ABNORMAL LOW (ref 22–32)
Calcium: 7.1 mg/dL — ABNORMAL LOW (ref 8.9–10.3)
Chloride: 104 mmol/L (ref 98–111)
Creatinine, Ser: 2.43 mg/dL — ABNORMAL HIGH (ref 0.61–1.24)
GFR, Estimated: 31 mL/min — ABNORMAL LOW (ref >60)
Glucose, Bld: 102 mg/dL — ABNORMAL HIGH (ref 70–99)
Potassium: 4 mmol/L (ref 3.5–5.1)
Sodium: 131 mmol/L — ABNORMAL LOW (ref 135–145)

## 2021-01-20 LAB — ANCA TITERS
Atypical P-ANCA titer: <1:20 {titer}
C-ANCA: <1:20 {titer}
P-ANCA: <1:20 {titer}

## 2021-01-20 LAB — ENA+DNA/DS+ANTICH+CENTRO+JO...
Anti JO-1: <0.2 AI (ref 0.0–0.9)
Centromere Ab Screen: <0.2 AI (ref 0.0–0.9)
Chromatin Ab SerPl-aCnc: <0.2 AI (ref 0.0–0.9)
ENA SM Ab Ser-aCnc: <0.2 AI (ref 0.0–0.9)
Ribonucleic Protein: <0.2 AI (ref 0.0–0.9)
SSA (Ro) (ENA) Antibody, IgG: <0.2 AI (ref 0.0–0.9)
SSB (La) (ENA) Antibody, IgG: 3.2 AI — ABNORMAL HIGH (ref 0.0–0.9)
Scleroderma (Scl-70) (ENA) Antibody, IgG: <0.2 AI (ref 0.0–0.9)
ds DNA Ab: 2 IU/mL (ref 0–9)

## 2021-01-20 LAB — CYTOLOGY - NON PAP

## 2021-01-20 LAB — GLOMERULAR BASEMENT MEMBRANE ANTIBODIES: GBM Ab: 3 units (ref 0–20)

## 2021-01-20 LAB — KAPPA/LAMBDA LIGHT CHAINS
Kappa free light chain: 128.2 mg/L — ABNORMAL HIGH (ref 3.3–19.4)
Kappa, lambda light chain ratio: 1.57 (ref 0.26–1.65)
Lambda free light chains: 81.5 mg/L — ABNORMAL HIGH (ref 5.7–26.3)

## 2021-01-20 LAB — ANA W/REFLEX IF POSITIVE: Anti Nuclear Antibody (ANA): POSITIVE — AB

## 2021-01-20 LAB — PH, BODY FLUID: pH, Body Fluid: 7.3

## 2021-01-20 LAB — C4 COMPLEMENT: Complement C4, Body Fluid: 58 mg/dL — ABNORMAL HIGH (ref 12–38)

## 2021-01-20 LAB — ANTI-DNA ANTIBODY, DOUBLE-STRANDED: ds DNA Ab: 3 IU/mL (ref 0–9)

## 2021-01-20 LAB — RHEUMATOID FACTOR: Rheumatoid fact SerPl-aCnc: <10 IU/mL (ref <14.0)

## 2021-01-20 LAB — C3 COMPLEMENT: C3 Complement: 187 mg/dL — ABNORMAL HIGH (ref 82–167)

## 2021-01-20 SURGERY — RIGHT HEART CATH
Anesthesia: LOCAL

## 2021-01-20 MED ORDER — LIDOCAINE HCL (PF) 1 % IJ SOLN
INTRAMUSCULAR | Status: AC
  Filled 2021-01-20: qty 30

## 2021-01-20 MED ORDER — LIDOCAINE HCL (PF) 1 % IJ SOLN
INTRAMUSCULAR | Status: DC | PRN
  Administered 2021-01-20: 2 mL

## 2021-01-20 MED ORDER — HEPARIN (PORCINE) IN NACL 1000-0.9 UT/500ML-% IV SOLN
INTRAVENOUS | Status: AC
  Filled 2021-01-20: qty 500

## 2021-01-20 MED ORDER — HEPARIN (PORCINE) IN NACL 1000-0.9 UT/500ML-% IV SOLN
INTRAVENOUS | Status: DC | PRN
  Administered 2021-01-20: 500 mL

## 2021-01-20 MED ORDER — MELATONIN 5 MG PO TABS
5.0000 mg | ORAL_TABLET | Freq: Every day | ORAL | Status: DC
  Filled 2021-01-20: qty 1

## 2021-01-20 SURGICAL SUPPLY — 6 items
CATH BALLN WEDGE 5F 110CM (CATHETERS) ×1 IMPLANT
PACK CARDIAC CATHETERIZATION (CUSTOM PROCEDURE TRAY) ×2 IMPLANT
SHEATH GLIDE SLENDER 4/5FR (SHEATH) ×1 IMPLANT
TRANSDUCER W/STOPCOCK (MISCELLANEOUS) ×2 IMPLANT
TUBING ART PRESS 72  MALE/FEM (TUBING) ×2
TUBING ART PRESS 72 MALE/FEM (TUBING) IMPLANT

## 2021-01-20 NOTE — Progress Notes (Signed)
Nephrology Follow-Up Consult note   Assessment/Recommendations: Rodney Strickland is a/an 55 y.o. male with a past medical history significant for gout, HTN, CAD s/p CABG rencently, CHF who presents with AKI and hyperkalemia  Nonoliguric AKI on CKD 3 a: Likely secondary to dehydration given nausea with some intrinsic renal injury possibly with contributions from lisinopril.    However, also some concern for underlying glomerular disease as below.  -Creatinine essentially stable today -Follow-up right heart catheterization and consider diuretics if needed -Urinalysis with hematuria and proteinuria, work-up as below -Continue to monitor daily Cr, Dose meds for GFR -Monitor Daily I/Os, Daily weight  -Maintain MAP>65 for optimal renal perfusion.  -Avoid nephrotoxic medications including NSAIDs and Vanc/Zosyn combo -Currently no indication for HD  Proteinuria/hematuria: Present on urinalysis from this hospitalization as well as February.  Unclear cause at this time but there is concern for glomerular disease with UPC around 10 g.  Complements normal but further studies pending -Follow-up glomerular serologies -We will likely consider biopsy in the future.  If he remains inpatient on Monday we could biopsy and then but also if kidney function is stable or improving could arrange for biopsy outpatient  Hyperkalemia: Resolved with current treatment.  Continue to monitor  Metabolic acidosis: Likely associated with AKI.  Greatly improved.  Continue sodium bicarb  Pleural effusion: Moderate in size on left side.  Present on CT scan.  Likely related to volume overload.    Status post thoracentesis with bloody fluid.  Work-up per primary team  Recent CABG: CT surgery following.  Appreciate help  Hypertension: Blood pressure well controlled at this time.  Possibly higher outpatient.  Continue current treatment  CHF: EF normal.    Has diastolic dysfunction.  Heart failure team following.  Plan  for right heart catheterization today.  We will follow up results   Recommendations conveyed to primary service.    Darnell Level [facility] 01/20/2021 11:52 AM  ___________________________________________________________  CC: AKI on CKD  Interval History/Subjective: Patient states he continues to feel better today.  Creatinine essentially stable.  UPC returned with 10 g of proteinuria.  Plans for right heart catheterization today   Medications:  Current Facility-Administered Medications  Medication Dose Route Frequency Provider Last Rate Last Admin  . acetaminophen (TYLENOL) tablet 650 mg  650 mg Oral Q6H PRN Barrett, Erin R, PA-C      . aspirin EC tablet 81 mg  81 mg Oral Daily Barrett, Erin R, PA-C   81 mg at 01/20/21 1027  . docusate sodium (COLACE) capsule 100 mg  100 mg Oral BID PRN Barrett, Erin R, PA-C      . enoxaparin (LOVENOX) injection 30 mg  30 mg Subcutaneous Q24H Barrett, Erin R, PA-C   30 mg at 01/19/21 1815  . feeding supplement (ENSURE ENLIVE / ENSURE PLUS) liquid 237 mL  237 mL Oral TID WC Barrett, Erin R, PA-C   237 mL at 01/18/21 1724  . icosapent Ethyl (VASCEPA) 1 g capsule 2 g  2 g Oral BID Robbie Lis M, PA-C   2 g at 01/20/21 1026  . loratadine (CLARITIN) tablet 10 mg  10 mg Oral Daily PRN Barrett, Erin R, PA-C      . metoprolol tartrate (LOPRESSOR) tablet 25 mg  25 mg Oral BID Barrett, Erin R, PA-C   25 mg at 01/20/21 1026  . ondansetron (ZOFRAN) injection 4 mg  4 mg Intravenous Q6H PRN Barrett, Erin R, PA-C      .  oxyCODONE (Oxy IR/ROXICODONE) immediate release tablet 5 mg  5 mg Oral Q4H PRN Barrett, Erin R, PA-C   5 mg at 01/19/21 2114  . polyethylene glycol (MIRALAX / GLYCOLAX) packet 17 g  17 g Oral Daily PRN Barrett, Erin R, PA-C      . rosuvastatin (CRESTOR) tablet 20 mg  20 mg Oral Daily Robbie Lis M, PA-C   20 mg at 01/20/21 1027  . sodium bicarbonate tablet 650 mg  650 mg Oral BID Darnell Level, MD   650 mg at  01/20/21 1027  . sodium chloride flush (NS) 0.9 % injection 3 mL  3 mL Intravenous Q12H Clegg, Amy D, NP          Review of Systems: 10 systems reviewed and negative except per interval history/subjective  Physical Exam: Vitals:   01/20/21 1026 01/20/21 1150  BP: 111/79   Pulse: 96 78  Resp:    Temp:    SpO2:     No intake/output data recorded.  Intake/Output Summary (Last 24 hours) at 01/20/2021 1152 Last data filed at 01/19/2021 2100 Gross per 24 hour  Intake 240 ml  Output --  Net 240 ml   Constitutional: Tired but no distress, sitting in chair ENMT: ears and nose without scars or lesions, MMM CV: normal rate, 1+ pitting edema in the bilateral lower extremities Respiratory: Bilateral chest rise with no increased work of breathing Gastrointestinal: soft, non-tender, no palpable masses or hernias Skin: Surgical scar on left forearm and chest, otherwise no visible lesions or rashes Psych: alert, judgement/insight appropriate, appropriate mood and affect   Test Results I personally reviewed new and old clinical labs and radiology tests Lab Results  Component Value Date   NA 131 (L) 01/20/2021   K 4.0 01/20/2021   CL 104 01/20/2021   CO2 21 (L) 01/20/2021   BUN 61 (H) 01/20/2021   CREATININE 2.43 (H) 01/20/2021   CALCIUM 7.1 (L) 01/20/2021   ALBUMIN 1.8 (L) 01/03/2021

## 2021-01-20 NOTE — Progress Notes (Signed)
      [address]       [place] [address]             [phone]       Subjective:   Patient feeling better today.  Breathing improved.  Appetite slightly improved yesterday.  Wishes he could have some water this morning, however he is NPO for RHC  Objective: Vital signs in last 24 hours: Temp:  [97.8 F (36.6 C)-98.2 F (36.8 C)] 98.2 F (36.8 C) (03/11 0629) Pulse Rate:  [87-95] 93 (03/11 0629) Cardiac Rhythm: Normal sinus rhythm (03/10 2100) Resp:  [16-18] 18 (03/11 0629) BP: (112-142)/(79-89) 112/79 (03/11 0629) SpO2:  [97 %-98 %] 98 % (03/11 0629) Weight:  [77.1 kg] 77.1 kg (03/11 0629)  Intake/Output from previous day: 03/10 0701 - 03/11 0700 In: 240 [P.O.:240] Out: -   General appearance: alert, cooperative and no distress Heart: regular rate and rhythm Lungs: diminished breath sounds left base Abdomen: soft, non-tender; bowel sounds normal; no masses,  no organomegaly Extremities: edema present, una boots in place Wound: clean and dry  Lab Results: Recent Labs    01/17/21 1842 01/20/21 0154  WBC 9.0 8.2  HGB 10.4* 10.1*  HCT 29.1* 28.4*  PLT 442* 453*   BMET:  Recent Labs    01/19/21 0253 01/20/21 0154  NA 131* 131*  K 4.0 4.0  CL 105 104  CO2 20* 21*  GLUCOSE 102* 102*  BUN 62* 61*  CREATININE 2.35* 2.43*  CALCIUM 7.3* 7.1*    PT/INR: No results for input(s): LABPROT, INR in the last 72 hours. ABG    Component Value Date/Time   PHART 7.323 (L) 01/05/2021 1834   HCO3 22.2 01/05/2021 1834   TCO2 23 01/05/2021 1834   ACIDBASEDEF 4.0 (H) 01/05/2021 1834   O2SAT 94.0 01/05/2021 1834   CBG (last 3)  No results for input(s): GLUCAP in the last 72 hours.  Assessment/Plan: S/P Procedure(s) (LRB): RIGHT HEART CATH (N/A)  1. CV- NSR, BP stable- currently on Lopressor, Vascepa, Crestor... AHF following, for RHC today 2. Pulm- S/P Thoracentesis, 1L of fluid removed, post film was free of pneumothorax, significant  improvement of left pleural, repeat CXR in AM 3. Renal- creatinine at 2.43 today, currently being worked up for Nephrotic/Nephritic syndrome per Nephrology 4. GI- oral intake improving, no further nausea, continue ensure supplements 5. Hyperkalemia- stable at 4.0 6. Dispo- patient stable, for RHC today, breathing improved post thoracentesis, will repeat CXR in AM, continue renal workup per Nephrology, continue oral intake   LOS: 3 days    Lowella Dandy, PA-C 01/20/2021

## 2021-01-20 NOTE — Progress Notes (Signed)
Physical Therapy Treatment Patient Details Name: Rodney Strickland MRN: [id number] DOB: [birth year] Today's Date: 01/20/2021    History of Present Illness Pt is a 55 y.o. male s/p recent CABGx4 (01/05/21) complicated by pericarditis with d/c home 01/10/21, presented for post-discharge with nausea, HTN, weakness, SOB and subsequently readmitted 01/17/21 - workup for L pleural effusion, AKI, hyperkalemia, R chest wall hematoma. S/p L thoracentesis 3/10. S/p heart cath 3/11. Other PMH includes CAD, gout, HTN.   PT Comments    Pt progressing with mobility, remains limited by fatigue and decreased activity tolerance. Pt tolerated hallway ambulation and short bouts of therex well with supervision for safety. Family present and supportive. Encouraged more frequent OOB mobility while admitted, including hallway ambulation with supervision from staff or family. Will continue to follow acutely.   Follow Up Recommendations  Home health PT;Supervision - Intermittent     Equipment Recommendations  None recommended by PT    Recommendations for Other Services       Precautions / Restrictions Precautions Precautions: Fall Restrictions Weight Bearing Restrictions: No    Mobility  Bed Mobility Overal bed mobility: Needs Assistance Bed Mobility: Supine to Sit;Sit to Supine     Supine to sit: Min assist Sit to supine: Min assist   General bed mobility comments: Wife providing minA for trunk elevation to sit EOB; wife providing minA for LE management return to supine    Transfers Overall transfer level: Needs assistance Equipment used: None;Rolling walker (2 wheeled) Transfers: Sit to/from Stand Sit to Stand: Supervision         General transfer comment: Pt requesting use of RW, able to stand from EOB to RW with supervision for safety/lines. Additional repeated sit<>stands from EOB without DME, pt performing without UE support, increased fatigue  Ambulation/Gait Ambulation/Gait assistance:  Supervision Gait Distance (Feet): 200 Feet Assistive device: Rolling walker (2 wheeled) Gait Pattern/deviations: Step-through pattern;Decreased stride length Gait velocity: Decreased   General Gait Details: Pt requesting use of RW for ambulation - slow, guarded gait with RW and supervision for safety; pt agreeable to increased distance with encouragement; no overt instability or LOB, pt fatigued   Stairs             Wheelchair Mobility    Modified Rankin (Stroke Patients Only)       Balance Overall balance assessment: Needs assistance Sitting-balance support: No upper extremity supported;Feet supported Sitting balance-Leahy Scale: Good       Standing balance-Leahy Scale: Fair Standing balance comment: Can stand and take steps without UE support; pt appreciates having RW for comfort                            Cognition Arousal/Alertness: Awake/alert Behavior During Therapy: Flat affect Overall Cognitive Status: Within Functional Limits for tasks assessed                                        Exercises Other Exercises Other Exercises: Noted swollen bilateral hands/fingers - encouraged frequent AROM and elevation Other Exercises: 5x repeated sit<>stand without UE support Other Exercises: Encouraged hourly LE therex/ROM while supine or seated (hip flex, knee flex/ext, ankle pumps)    General Comments General comments (skin integrity, edema, etc.): Wife and daughter present and supportive. Pt going for cardiac cath this AM. Encouraged more frequent ambulation and OOB mobility while admitted (walking at least 3-5x/day while  admitted)      Pertinent Vitals/Pain Pain Assessment: Faces Faces Pain Scale: Hurts a little bit Pain Location: sternal incision Pain Descriptors / Indicators: Discomfort Pain Intervention(s): Monitored during session    Home Living                      Prior Function            PT Goals (current  goals can now be found in the care plan section) Progress towards PT goals: Progressing toward goals    Frequency    Min 3X/week      PT Plan Current plan remains appropriate    Co-evaluation              AM-PAC PT "6 Clicks" Mobility   Outcome Measure  Help needed turning from your back to your side while in a flat bed without using bedrails?: A Little Help needed moving from lying on your back to sitting on the side of a flat bed without using bedrails?: A Little Help needed moving to and from a bed to a chair (including a wheelchair)?: A Little Help needed standing up from a chair using your arms (e.g., wheelchair or bedside chair)?: A Little Help needed to walk in hospital room?: A Little Help needed climbing 3-5 steps with a railing? : A Little 6 Click Score: 18    End of Session   Activity Tolerance: Patient tolerated treatment well;Patient limited by fatigue Patient left: with family/visitor present;in bed Nurse Communication: Mobility status PT Visit Diagnosis: Other abnormalities of gait and mobility (R26.89);Muscle weakness (generalized) (M62.81);Other (comment)     Time: 7425-9563 PT Time Calculation (min) (ACUTE ONLY): 15 min  Charges:  $Therapeutic Exercise: 8-22 mins                     Ina Homes, PT, DPT Acute Rehabilitation Services  Pager [phone] Office [phone]  Malachy Chamber 01/20/2021, 12:05 PM

## 2021-01-20 NOTE — Interval H&P Note (Signed)
History and Physical Interval Note:  01/20/2021 1:04 PM  Rodney Strickland  has presented today for surgery, with the diagnosis of chest pain.  The various methods of treatment have been discussed with the patient and family. After consideration of risks, benefits and other options for treatment, the patient has consented to  Procedure(s): RIGHT HEART CATH (N/A) as a surgical intervention.  The patient's history has been reviewed, patient examined, no change in status, stable for surgery.  I have reviewed the patient's chart and labs.  Questions were answered to the patient's satisfaction.     Kayal Mula [employer]

## 2021-01-20 NOTE — Progress Notes (Addendum)
Patient ID: Rodney Strickland, male   DOB: 12/19/1965, 55 y.o.   MRN: [id number]     Advanced Heart Failure Rounding Note  PCP-Cardiologist: No primary care provider on file.   Subjective:   He has been off diuretics/acei   01/19/21 S/P Left Thoracentesis with 1 liter removed.   Denies SOB.    Objective:   Weight Range: 77.1 kg Body mass index is 27.42 kg/m.   Vital Signs:   Temp:  [97.6 F (36.4 C)-98.2 F (36.8 C)] 97.6 F (36.4 C) (03/11 0750) Pulse Rate:  [87-95] 87 (03/11 0750) Resp:  [16-18] 18 (03/11 0750) BP: (101-142)/(70-89) 101/70 (03/11 0750) SpO2:  [96 %-98 %] 96 % (03/11 0750) Weight:  [77.1 kg] 77.1 kg (03/11 0629) Last BM Date: 01/18/21  Weight change: Filed Weights   01/18/21 0449 01/19/21 0455 01/20/21 0629  Weight: 77.3 kg 78.4 kg 77.1 kg    Intake/Output:   Intake/Output Summary (Last 24 hours) at 01/20/2021 0801 Last data filed at 01/19/2021 2100 Gross per 24 hour  Intake 240 ml  Output --  Net 240 ml      Physical Exam   General:   No resp difficulty HEENT: normal Neck: supple. no JVD. Carotids 2+ bilat; no bruits. No lymphadenopathy or thryomegaly appreciated. Cor: PMI nondisplaced. Regular rate & rhythm. No rubs, gallops or murmurs. Sternal incision approximated Lungs: Crackles. On room air.  Abdomen: soft, nontender, nondistended. No hepatosplenomegaly. No bruits or masses. Good bowel sounds. Extremities: no cyanosis, clubbing, rash, R and LLE unna boots. L forearm incision approximated.  Neuro: alert & orientedx3, cranial nerves grossly intact. moves all 4 extremities w/o difficulty. Affect pleasant   Telemetry   NSR 90s personally reviewed.  Labs    CBC Recent Labs    01/17/21 1842 01/20/21 0154  WBC 9.0 8.2  HGB 10.4* 10.1*  HCT 29.1* 28.4*  MCV 88.4 86.6  PLT 442* 253*   Basic Metabolic Panel Recent Labs    01/19/21 0253 01/20/21 0154  NA 131* 131*  K 4.0 4.0  CL 105 104  CO2 20* 21*  GLUCOSE 102* 102*  BUN  62* 61*  CREATININE 2.35* 2.43*  CALCIUM 7.3* 7.1*   Liver Function Tests No results for input(s): AST, ALT, ALKPHOS, BILITOT, PROT, ALBUMIN in the last 72 hours. No results for input(s): LIPASE, AMYLASE in the last 72 hours. Cardiac Enzymes No results for input(s): CKTOTAL, CKMB, CKMBINDEX, TROPONINI in the last 72 hours.  BNP: BNP (last 3 results) Recent Labs    01/19/21 0253  BNP 185.2*    ProBNP (last 3 results) No results for input(s): PROBNP in the last 8760 hours.   D-Dimer No results for input(s): DDIMER in the last 72 hours. Hemoglobin A1C No results for input(s): HGBA1C in the last 72 hours. Fasting Lipid Panel No results for input(s): CHOL, HDL, LDLCALC, TRIG, CHOLHDL, LDLDIRECT in the last 72 hours. Thyroid Function Tests No results for input(s): TSH, T4TOTAL, T3FREE, THYROIDAB in the last 72 hours.  Invalid input(s): FREET3  Other results:   Imaging    DG Chest 1 View  Result Date: 01/19/2021 CLINICAL DATA:  Status post thoracentesis EXAM: CHEST  1 VIEW COMPARISON:  January 18, 2021 chest radiograph and chest CT FINDINGS: No pneumothorax. There has been resolution of pleural effusion on the left after thoracentesis. Areas of atelectatic change in the left base noted. Consolidation noted medial left base. Right lung is clear. Heart size and pulmonary vascularity are normal. No adenopathy. Patient is  status post coronary artery bypass grafting. No adenopathy. No bone lesions. IMPRESSION: Resolution of left-sided pleural effusion. Airspace opacity concerning for pneumonia medial left base as well as areas of atelectatic change elsewhere in the left lower lung region. Right lung clear. Stable cardiac silhouette. Postoperative coronary artery bypass grafting. Electronically Signed   By: Lowella Grip III M.D.   On: 01/19/2021 15:10   IR THORACENTESIS ASP PLEURAL SPACE W/IMG GUIDE  Result Date: 01/19/2021 INDICATION: Patient with a history of a recent CABG  presents today with left pleural effusion. Interventional radiology asked to perform a diagnostic and therapeutic thoracentesis. EXAM: ULTRASOUND GUIDED THORACENTESIS MEDICATIONS: 1% lidocaine 10 mL COMPLICATIONS: None immediate. PROCEDURE: An ultrasound guided thoracentesis was thoroughly discussed with the patient and questions answered. The benefits, risks, alternatives and complications were also discussed. The patient understands and wishes to proceed with the procedure. Written consent was obtained. Ultrasound was performed to localize and mark an adequate pocket of fluid in the left chest. The area was then prepped and draped in the normal sterile fashion. 1% Lidocaine was used for local anesthesia. Under ultrasound guidance a 6 Fr Safe-T-Centesis catheter was introduced. Thoracentesis was performed. The catheter was removed and a dressing applied. FINDINGS: A total of approximately 1 L of blood-tinged fluid was removed. Samples were sent to the laboratory as requested by the clinical team. IMPRESSION: Successful ultrasound guided left thoracentesis yielding 1 L of pleural fluid. Read by: Soyla Dryer, NP Electronically Signed   By: Lucrezia Europe M.D.   On: 01/19/2021 15:15     Medications:     Scheduled Medications:  aspirin EC  81 mg Oral Daily   enoxaparin (LOVENOX) injection  30 mg Subcutaneous Q24H   feeding supplement  237 mL Oral TID WC   icosapent Ethyl  2 g Oral BID   metoprolol tartrate  25 mg Oral BID   rosuvastatin  20 mg Oral Daily   sodium bicarbonate  650 mg Oral BID   sodium chloride flush  3 mL Intravenous Q12H    Infusions:  sodium chloride     sodium chloride 10 mL/hr at 01/20/21 0654    PRN Medications: sodium chloride, acetaminophen, docusate sodium, loratadine, ondansetron (ZOFRAN) IV, oxyCODONE, polyethylene glycol, sodium chloride flush   Assessment/Plan   1. AKI: Baseline creatinine around 1.3, creatinine 3.21 at admission.  He had been getting  Lasix 80 mg daily due to significant lower extremity edema.  Hypoalbuminemia (albumin 1.8) noted with proteinuria, I wonder if this is a nephrotic syndrome picture with peripheral edema due to low oncotic pressure rather than CHF, and patient developed AKI due to aggressive Lasix regimen + lisinopril. Creatinine down to 2.35 today. He is not a diabetic. ESR is elevated, which can be seen with nephrotic syndrome.  - Multiple serologies have been sent as well as SPEP/UPEP.  - Off diuretics and AceiHold Lasix and lisinopril.  - Waiting for spot urine protein/creatinine ratio.  Getting 24 hr urine protein and creatinine collection.  - Nephrology following.  - With proteinuria, would eventually want to restart ACEI/ARB.  2. Chronic diastolic CHF: Echo this admission with EF 60-65%, normal RV, normal IVC.  No JVD.  He has significant lower extremity edema, but as above, suspect this is due to low oncotic pressure/nephrotic syndrome with hypoalbuminemia rather than CHF.  -RHC today to further assess filling pressures.  May need diuretics based on resulted.  - Hold off on Lasix for now.  - Place Unna boots.  3. CAD: s/p  CABG in 2/22.  - Continue Crestor to 20 mg daily.  - Continue ASA - Continue vascepa with elevated triglycerides.  4. Pleural effusion: Moderate on left.  - 3/10 22 S/P Thoracentesis on Left with 1 liter removed.  - IR appreciated..  5. Extrapleural hematoma: On right, post-op.  Hgb stable so suspect not actively bleeding into this. However, with elevated ESR would also be concerned about infection of hematoma.  - Management per TCTS, ?need IR to obtain sample for culture.  6. Post-op pericarditis/post-pericardiotomy syndrome: No further chest pain.  - Stopped colchicine with AKI.  7. Hyperkalemia: Due to AKI.  Resolved. - Stopped lisinopril.  - Got Lokelma.  - Resolved.   Length of Stay: 3  Amy Clegg, NP  01/20/2021, 8:01 AM  Advanced Heart Failure Team Pager [phone] (M-F; 7a  - 5p)  Please contact [facility] for night-coverage after hours (4p -7a ) and weekends on [email or web address]  Patient seen with NP, agree with the above note.   RHC Procedural Findings: Hemodynamics (mmHg) RA mean 2 RV 16/1 PA 20/2, mean 12 PCWP mean 2 Oxygen saturations: PA 65% AO 96% Cardiac Output (Fick) 6.94  Cardiac Index (Fick) 3.67  General: NAD Neck: No JVD, no thyromegaly or thyroid nodule.  Lungs: Clear to auscultation bilaterally with normal respiratory effort. CV: Nondisplaced PMI.  Heart regular S1/S2, no S3/S4, no murmur. 2+ edema to knees.  Abdomen: Soft, nontender, no hepatosplenomegaly, no distention.  Skin: Intact without lesions or rashes.  Neurologic: Alert and oriented x 3.  Psych: Normal affect. Extremities: No clubbing or cyanosis.  HEENT: Normal.   Still with anasarca, creatinine mildly higher at 2.43.  RHC today showed low filling pressures and preserved cardiac output.  I think that the primary issue here is renal.  Patient has nephrotic level proteinuria with urine protein/creatinine ratio 11.3.  I suspect this is the cause of his anasarca (not CHF).  He also was noted to have hematuria on UA.  Therefore, suspect nephrotic syndrome (?form of glomerulonephritis given UA with RBCs though no casts mentioned).   - No Lasix needed.  - Avoid nephrotoxins.  - Workup at this point will be per renal => I think he needs a renal biopsy.  - Cardiology will be available to prn, will follow at a distance.   Loralie Champagne 01/20/2021 1:36 PM

## 2021-01-20 NOTE — Discharge Summary (Signed)
[address]       [place] [address]             [phone]    Physician Discharge Summary  Patient ID: Rodney Strickland MRN: [id number] DOB/AGE: 1966-01-20 55 y.o.  Admit date: 01/17/2021 Discharge date: 01/22/2021  Admission Diagnoses:  Patient Active Problem List   Diagnosis Date Noted  . Failure to thrive in adult 01/17/2021  . AKI (acute kidney injury) ([place]) 01/17/2021  . Hyperkalemia 01/17/2021  . S/P CABG x 4 01/05/2021  . Abnormal cardiac CT angiography 12/29/2020  . Unstable angina ([place]) 12/08/2020  . Right shoulder pain 11/21/2016     Discharge Diagnoses:  Patient Active Problem List   Diagnosis Date Noted  . Failure to thrive in adult 01/17/2021  . AKI (acute kidney injury) ([place]) 01/17/2021  . Hyperkalemia 01/17/2021  . S/P CABG x 4 01/05/2021  . Abnormal cardiac CT angiography 12/29/2020  . Unstable angina ([place]) 12/08/2020  . Right shoulder pain 11/21/2016     Discharged Condition: good  History of Present Illness:  Patient is a 55 y.o. male well known to [facility].  He recently underwent CABG x 4 on 01/05/2021.  His hospital stay was complicated by post operative pericarditis, poor appetite, elevated blood pressures, and elevated creatinine.  These symptoms all improved prior to discharge and he was discharged on his home antihypertensive regimen.  He was discharged home on 01/10/2021.  His wife contacted our office on 01/11/2021 with concerns about patient having issues with nausea.  There was also concerns about patients elevated BP.  I called and spoke with the patient's wife who overall was very concerned about her husbands elevated blood pressure.  However, being the patient was just resumed on all his home medications the day prior I felt we should continue his medications as ordered.  He was given a prescription for zofran to help with nausea.  The patient's wife stated he was up and moving around, however he would experience some  dizziness.  They were instructed to stop Lasix and only take on days, the patient experienced a weight gain of 3 lbs or more.  They presented for scheduled hospital discharge follow up with Dr. Orvan Seen on 01/16/2021 at which time the patient's issues persisted.  He presented in a wheel chair with complaints of weakness and shortness of breath.  He also stated his appetite was poor, nausea, and he was not sleeping well.  He was taking his Lasix without much of a response.  CXR was obtained and showed a left sided pleural effusion.  BMET was also obtained and showed an elevated creatinine of 3.21, BUN was 75,  K was elevated at 5.5, and his hemoglobin level improved to 10.8.  It was felt the patient would require admission.  He was contacted and direct admission was arranged.  Hospital Course:  Upon arrival to the hospital Echocardiogram was obtained.  This did not show any evidence of cardiac tamponade.  His EF was also normal at 60-65%.  He was started on IV fluids due to his elevated creatinine.  Repeat blood work showed some improvement in his creatinine to 2.7.  He remained hyperkalemic with level reaching 6.1.  He was started on Lokelma with good response.  Repeat level was down to 4.1  Renal ultrasound was ordered and Nephrology consult was obtained.  The patients CXR showed a slight increase in left pleural effusion with atelectasis and possible infiltrate.  CT of  the chest was performed and showed a left pleural effusion.  He was also noted to have a right chest wall hematoma.  The patient was persistently hypertensive prior to admission to the hospital. Advanced heart failure was consulted to assist with management of the patient.  They evaluated patient and felt he could very well be suffering from nephrotic syndrome.  They ordered multiple urine studies.  His UA was positive for glucose, blood, and protein.  A total urine protein count was elevated at 1386.  His protein creatinine was 11.33.  ESR was also  obtained and was elevated at 135.  Una boots were applied to help with LE edema.  His nausea had improved, but overall his oral intake was marginal.  He was supplemented with ensure and he was encouraged to eat what he was able.  He was sent for Thoracentesis on the left side.  They were able to remove 1000 ml of blood tinged fluid.  The fluid was cultured and showed abundant WBC no organisms.  Nephrology add bicarb to patient's medication regimen to treat acidosis.  They also ordered workup for Nephritic/Neprhotic syndrome.  They felt patient may need a renal biopsy in the future which could be done on an outpatient basis.  The patient underwent right heart catheterization on 01/19/2021.  This showed no CHF. Patient is stable for discharge today. Will follow-up outpatient with renal on Thursday for biopsy.    Consults: cardiology, nephrology and interventional radiology  Significant Diagnostic Studies:   CT Scan:  IMPRESSION: 1. Interval median sternotomy and CABG. 2. Probable extrapleural hematoma anteriorly in the right hemithorax, possibly related to a bleeding internal mammary artery. No significant mediastinal hematoma. 3. Moderate-sized left pleural effusion with subtotal collapse of the left lower lobe. Trace right pleural effusion. 4. Aortic Atherosclerosis (ICD10-I70.0).  RHC:  1. Low filling pressures.  2. Preserved cardiac output.   No evidence for CHF.   Treatments: N/A   Discharge Exam: Blood pressure (!) 148/82, pulse 89, temperature 98.3 F (36.8 C), temperature source Oral, resp. rate 16, height 5' 6" (1.676 m), weight 80 kg, SpO2 96 %.   General appearance: alert, cooperative and no distress Heart: regular rate and rhythm, S1, S2 normal, no murmur, click, rub or gallop Lungs: clear to auscultation bilaterally Abdomen: soft, non-tender; bowel sounds normal; no masses,  no organomegaly Extremities: extremities normal, atraumatic, no cyanosis or edema Wound: clean  and dry   Discharge Medications: Discussed bicarb with Dr. Joylene Grapes  Allergies as of 01/22/2021   No Known Allergies     Medication List    STOP taking these medications   amLODipine 5 MG tablet Commonly known as: NORVASC   cholecalciferol 25 MCG (1000 UNIT) tablet Commonly known as: VITAMIN D3   ferrous [id number] C-folic acid capsule Commonly known as: TRINSICON / FOLTRIN   furosemide 80 MG tablet Commonly known as: Lasix   multivitamin with minerals tablet   nitroGLYCERIN 0.4 MG SL tablet Commonly known as: NITROSTAT   oxyCODONE-acetaminophen 5-325 MG tablet Commonly known as: Percocet     TAKE these medications   acetaminophen 325 MG tablet Commonly known as: TYLENOL Take 650 mg by mouth every 6 (six) hours as needed for headache.   allopurinol 100 MG tablet Commonly known as: ZYLOPRIM Take 100 mg by mouth daily.   aspirin EC 81 MG tablet Take 81 mg by mouth daily. Swallow whole.   icosapent Ethyl 1 g capsule Commonly known as: VASCEPA Take 2 capsules (2 g total) by  mouth 2 (two) times daily.   lisinopril 5 MG tablet Commonly known as: ZESTRIL Take 1 tablet (5 mg total) by mouth daily. Start taking on: January 23, 2021   loratadine 10 MG tablet Commonly known as: CLARITIN Take 10 mg by mouth daily as needed for allergies.   metoprolol tartrate 25 MG tablet Commonly known as: LOPRESSOR Take 1.5 tablets (37.5 mg total) by mouth 2 (two) times daily.   ondansetron 4 MG tablet Commonly known as: Zofran Take 1 tablet (4 mg total) by mouth every 8 (eight) hours as needed for nausea or vomiting.   oxyCODONE 5 MG immediate release tablet Commonly known as: Oxy IR/ROXICODONE Take 1 tablet (5 mg total) by mouth every 6 (six) hours as needed for severe pain.   rosuvastatin 20 MG tablet Commonly known as: CRESTOR Take 1 tablet (20 mg total) by mouth daily. Start taking on: January 23, 2021 What changed:   medication strength  how much to  take  when to take this   sodium bicarbonate 650 MG tablet Take 1 tablet (650 mg total) by mouth 2 (two) times daily.   vitamin C 500 MG tablet Commonly known as: ASCORBIC ACID Take 500 mg by mouth daily.   zinc gluconate 50 MG tablet Take 50 mg by mouth daily.       Follow-up Information    Ann Maki, DO. Call in 1 day(s).   Specialty: Family Medicine Contact information: [place] [address] [phone]        Reesa Chew, MD Follow up.   Specialty: Internal Medicine Why: They will set his appointment up for Thursday 3/17 and schedule renal biopsy at that time Contact information: [place] [address] [phone]        Richardson Dopp T, PA-C Follow up.   Specialties: Cardiology, Physician Assistant Why: Your routine follow-up is on 3/18 at 8:45am. Contact information: [address] [place] [address] [phone]        Wonda Olds, MD Follow up.   Specialty: Cardiothoracic Surgery Why: Your routine follow-up appointment is on 3/21 at 10:30am. Please arrive at 10:00am for a chest xray located at [facility] imaging which is on the first floor of our building. Contact information: [place] [address] [phone]               Signed: Nicholes Rough, PA-C 01/22/2021, 10:15 AM

## 2021-01-21 ENCOUNTER — Inpatient Hospital Stay (HOSPITAL_COMMUNITY): Payer: Federal, State, Local not specified - PPO

## 2021-01-21 DIAGNOSIS — N179 Acute kidney failure, unspecified: Secondary | ICD-10-CM | POA: Diagnosis not present

## 2021-01-21 DIAGNOSIS — I509 Heart failure, unspecified: Secondary | ICD-10-CM | POA: Diagnosis not present

## 2021-01-21 DIAGNOSIS — J90 Pleural effusion, not elsewhere classified: Secondary | ICD-10-CM | POA: Diagnosis not present

## 2021-01-21 DIAGNOSIS — E875 Hyperkalemia: Secondary | ICD-10-CM | POA: Diagnosis not present

## 2021-01-21 DIAGNOSIS — I2510 Atherosclerotic heart disease of native coronary artery without angina pectoris: Secondary | ICD-10-CM | POA: Diagnosis not present

## 2021-01-21 LAB — MPO/PR-3 (ANCA) ANTIBODIES
ANCA Proteinase 3: <3.5 U/mL (ref 0.0–3.5)
Myeloperoxidase Abs: <9 U/mL (ref 0.0–9.0)

## 2021-01-21 LAB — CBC
HCT: 24.6 % — ABNORMAL LOW (ref 39.0–52.0)
Hemoglobin: 8.4 g/dL — ABNORMAL LOW (ref 13.0–17.0)
MCH: 30.7 pg (ref 26.0–34.0)
MCHC: 34.1 g/dL (ref 30.0–36.0)
MCV: 89.8 fL (ref 80.0–100.0)
Platelets: 456 10*3/uL — ABNORMAL HIGH (ref 150–400)
RBC: 2.74 MIL/uL — ABNORMAL LOW (ref 4.22–5.81)
RDW: 13.2 % (ref 11.5–15.5)
WBC: 9.2 10*3/uL (ref 4.0–10.5)
nRBC: 0 % (ref 0.0–0.2)

## 2021-01-21 LAB — BASIC METABOLIC PANEL
Anion gap: 5 (ref 5–15)
BUN: 61 mg/dL — ABNORMAL HIGH (ref 6–20)
CO2: 20 mmol/L — ABNORMAL LOW (ref 22–32)
Calcium: 7.1 mg/dL — ABNORMAL LOW (ref 8.9–10.3)
Chloride: 106 mmol/L (ref 98–111)
Creatinine, Ser: 2.12 mg/dL — ABNORMAL HIGH (ref 0.61–1.24)
GFR, Estimated: 36 mL/min — ABNORMAL LOW (ref >60)
Glucose, Bld: 110 mg/dL — ABNORMAL HIGH (ref 70–99)
Potassium: 5.4 mmol/L — ABNORMAL HIGH (ref 3.5–5.1)
Sodium: 131 mmol/L — ABNORMAL LOW (ref 135–145)

## 2021-01-21 LAB — ALBUMIN: Albumin: 1.1 g/dL — ABNORMAL LOW (ref 3.5–5.0)

## 2021-01-21 MED ORDER — LISINOPRIL 5 MG PO TABS
5.0000 mg | ORAL_TABLET | Freq: Every day | ORAL | Status: DC
  Administered 2021-01-21 – 2021-01-22 (×2): 5 mg via ORAL
  Filled 2021-01-21 (×2): qty 1

## 2021-01-21 MED ORDER — SODIUM ZIRCONIUM CYCLOSILICATE 10 G PO PACK
10.0000 g | PACK | Freq: Once | ORAL | Status: AC
  Administered 2021-01-21: 10 g via ORAL
  Filled 2021-01-21: qty 1

## 2021-01-21 MED ORDER — ALBUMIN HUMAN 25 % IV SOLN
50.0000 g | Freq: Once | INTRAVENOUS | Status: AC
  Administered 2021-01-21: 50 g via INTRAVENOUS
  Filled 2021-01-21: qty 200

## 2021-01-21 NOTE — Progress Notes (Addendum)
[address]       [place] [address]             [phone]      1 Day Post-Op Procedure(s) (LRB): RIGHT HEART CATH (N/A) Subjective: Feels like he is breathing better. Still having edema in his right arm especially.   Objective: Vital signs in last 24 hours: Temp:  [97.5 F (36.4 C)-98.4 F (36.9 C)] 98.4 F (36.9 C) (03/12 0558) Pulse Rate:  [78-96] 87 (03/12 0558) Cardiac Rhythm: Normal sinus rhythm (03/11 2116) Resp:  [12-21] 18 (03/12 0558) BP: (101-165)/(70-109) 152/84 (03/12 0558) SpO2:  [96 %-98 %] 96 % (03/12 0558) Weight:  [76.2 kg] 76.2 kg (03/12 0558)     Intake/Output from previous day: 03/11 0701 - 03/12 0700 In: 480 [P.O.:480] Out: 350 [Urine:350] Intake/Output this shift: No intake/output data recorded.  General appearance: alert, cooperative and no distress Heart: regular rate and rhythm, S1, S2 normal, no murmur, click, rub or gallop Lungs: clear to auscultation bilaterally and diminished in the lower lobes Abdomen: soft, non-tender; bowel sounds normal; no masses,  no organomegaly Extremities: upper ext edema, lower ext wrapped.  Wound: sternotomy is c/d/i, radial harvest site is healing well. chest tube sutures removed today  Lab Results: Recent Labs    01/20/21 0154 01/20/21 1315 01/20/21 1316 01/21/21 0250  WBC 8.2  --   --  9.2  HGB 10.1*   < > 8.2* 8.4*  HCT 28.4*   < > 24.0* 24.6*  PLT 453*  --   --  456*   < > = values in this interval not displayed.   BMET:  Recent Labs    01/20/21 0154 01/20/21 1315 01/20/21 1316 01/21/21 0250  NA 131*   < > 135 131*  K 4.0   < > 4.3 5.4*  CL 104  --   --  106  CO2 21*  --   --  20*  GLUCOSE 102*  --   --  110*  BUN 61*  --   --  61*  CREATININE 2.43*  --   --  2.12*  CALCIUM 7.1*  --   --  7.1*   < > = values in this interval not displayed.    PT/INR: No results for input(s): LABPROT, INR in the last 72 hours. ABG    Component Value Date/Time   PHART  7.323 (L) 01/05/2021 1834   HCO3 20.7 01/20/2021 1316   TCO2 22 01/20/2021 1316   ACIDBASEDEF 4.0 (H) 01/20/2021 1316   O2SAT 66.0 01/20/2021 1316   CBG (last 3)  No results for input(s): GLUCAP in the last 72 hours.  Assessment/Plan: S/P Procedure(s) (LRB): RIGHT HEART CATH (N/A)   1. CV- NSR, BP stable- currently on Lopressor, Vascepa, Crestor... AHF following, Right heart cath showed: Low filling pressures. Preserved cardiac output. No evidence for CHF.  2. Pulm- S/P Thoracentesis, 1L of fluid removed, post film was free of pneumothorax, small left pleural effusion on CXR this morning. Encouraged use of incentive spirometer 3. Renal- creatinine at 2.12 today, currently being worked up for Nephrotic/Nephritic syndrome per Nephrology. Keep right arm elevated 4. GI- oral intake improving, no further nausea, continue ensure supplements 5. Hyperkalemia-  5.4, Lokelma ordered. 6. Dispo- Continue ambulation around the unit and use of incentive spirometer. All consults would have to agree before discharge. Hoping for Monday.    LOS: 4 days    Sharlene Dory 01/21/2021  Chart reviewed,  patient examined, agree with above. Right heart cath yesterday unremarkable with low filling pressures and normal CO.  CXR today shows some recurrence of left effusion. It is still small but may increase further. Continue to follow and if it increases he may benefit from PleurX.

## 2021-01-21 NOTE — Progress Notes (Signed)
Nephrology Follow-Up Consult note   Assessment/Recommendations: Rodney Strickland is a/an 56 y.o. male with a past medical history significant for gout, HTN, CAD s/p CABG rencently, CHF who presents with AKI and hyperkalemia  Nonoliguric AKI on CKD 3 a: Likely secondary to dehydration given nausea with some intrinsic renal injury possibly with contributions from lisinopril.    However, also some concern for underlying glomerular disease as below.  -Creatinine slightly improved today -No signs of volume overload on RHC -Urinalysis with hematuria and proteinuria, work-up as below -Continue to monitor daily Cr, Dose meds for GFR -Monitor Daily I/Os, Daily weight  -Maintain MAP>65 for optimal renal perfusion.  -Avoid nephrotoxic medications including NSAIDs and Vanc/Zosyn combo -Currently no indication for HD -Follow-up plans as below  Proteinuria/hematuria: Present on urinalysis from this hospitalization as well as February.  Unclear cause at this time but there is concern for glomerular disease with UPC around 10 g and significantly decreased serum albumin at 1.1.  Complements normal but further studies pending -Follow-up glomerular serologies -50 g of 25% albumin today -Started back lisinopril 5 mg daily today -Discussed with patient and cardiothoracic team; we can either keep him till Monday and do a biopsy at that time or discharge him and set up biopsy outpatient and follow-up with me on Thursday.  Hyperkalemia: Significantly improved overall but elevated today.  Lokelma x1  Metabolic acidosis: Likely associated with AKI.  Greatly improved.  Continue sodium bicarb  Pleural effusion: Moderate in size on left side.  Present on CT scan.  Likely related to volume overload.    Status post thoracentesis with bloody fluid.  Work-up per primary team  Recent CABG: CT surgery following.  Appreciate help  Hypertension: Blood pressure decent on current regimen.  Add back lisinopril 5 mg  daily.  CHF: EF normal.    Has diastolic dysfunction.  Heart failure team following.  No signs of volume overload or decreased CO on RHC on 3/11   Recommendations conveyed to primary service.    Darnell Level [facility] 01/21/2021 10:48 AM  ___________________________________________________________  CC: AKI on CKD  Interval History/Subjective: Patient states he continues to feel better.  However, continues to have persistent edema.  Right heart catheterization without signs of heart failure and low filling pressures.  He is anxious to get home.  We discussed several options for plan of likely necessary biopsy.   Medications:  Current Facility-Administered Medications  Medication Dose Route Frequency Provider Last Rate Last Admin  . acetaminophen (TYLENOL) tablet 650 mg  650 mg Oral Q6H PRN Barrett, Erin R, PA-C      . albumin human 25 % solution 50 g  50 g Intravenous Once Darnell Level, MD      . aspirin EC tablet 81 mg  81 mg Oral Daily Barrett, Erin R, PA-C   81 mg at 01/21/21 0809  . docusate sodium (COLACE) capsule 100 mg  100 mg Oral BID PRN Barrett, Erin R, PA-C      . enoxaparin (LOVENOX) injection 30 mg  30 mg Subcutaneous Q24H Barrett, Erin R, PA-C   30 mg at 01/20/21 1650  . feeding supplement (ENSURE ENLIVE / ENSURE PLUS) liquid 237 mL  237 mL Oral TID WC Barrett, Erin R, PA-C   237 mL at 01/21/21 0806  . icosapent Ethyl (VASCEPA) 1 g capsule 2 g  2 g Oral BID Robbie Lis M, PA-C   2 g at 01/21/21 0809  . lisinopril (ZESTRIL) tablet 5 mg  5 mg Oral Daily Darnell Level, MD   5 mg at 01/21/21 0809  . loratadine (CLARITIN) tablet 10 mg  10 mg Oral Daily PRN Barrett, Erin R, PA-C      . melatonin tablet 5 mg  5 mg Oral QHS Joellen Jersey., MD      . metoprolol tartrate (LOPRESSOR) tablet 25 mg  25 mg Oral BID Barrett, Erin R, PA-C   25 mg at 01/21/21 0809  . ondansetron (ZOFRAN) injection 4 mg  4 mg Intravenous Q6H PRN Barrett, Erin R,  PA-C      . oxyCODONE (Oxy IR/ROXICODONE) immediate release tablet 5 mg  5 mg Oral Q4H PRN Barrett, Erin R, PA-C   5 mg at 01/19/21 2114  . polyethylene glycol (MIRALAX / GLYCOLAX) packet 17 g  17 g Oral Daily PRN Barrett, Erin R, PA-C      . rosuvastatin (CRESTOR) tablet 20 mg  20 mg Oral Daily Robbie Lis M, PA-C   20 mg at 01/21/21 0809  . sodium bicarbonate tablet 650 mg  650 mg Oral BID Darnell Level, MD   650 mg at 01/21/21 0809  . sodium chloride flush (NS) 0.9 % injection 3 mL  3 mL Intravenous Q12H Clegg, Amy D, NP   3 mL at 01/21/21 0819      Review of Systems: 10 systems reviewed and negative except per interval history/subjective  Physical Exam: Vitals:   01/21/21 0558 01/21/21 0809  BP: (!) 152/84 (!) 126/93  Pulse: 87 90  Resp: 18   Temp: 98.4 F (36.9 C)   SpO2: 96%    No intake/output data recorded.  Intake/Output Summary (Last 24 hours) at 01/21/2021 1048 Last data filed at 01/21/2021 0558 Gross per 24 hour  Intake 480 ml  Output 350 ml  Net 130 ml   Constitutional: Tired but no distress, sitting in chair ENMT: ears and nose without scars or lesions, MMM CV: normal rate, 2+ pitting edema in the bilateral upper and lower extremities Respiratory: Bilateral chest rise with no increased work of breathing Gastrointestinal: soft, non-tender, no palpable masses or hernias Skin: Surgical scar on left forearm and chest, otherwise no visible lesions or rashes Psych: alert, judgement/insight appropriate, appropriate mood and affect   Test Results I personally reviewed new and old clinical labs and radiology tests Lab Results  Component Value Date   NA 131 (L) 01/21/2021   K 5.4 (H) 01/21/2021   CL 106 01/21/2021   CO2 20 (L) 01/21/2021   BUN 61 (H) 01/21/2021   CREATININE 2.12 (H) 01/21/2021   CALCIUM 7.1 (L) 01/21/2021   ALBUMIN 1.1 (L) 01/21/2021

## 2021-01-22 DIAGNOSIS — I509 Heart failure, unspecified: Secondary | ICD-10-CM | POA: Diagnosis not present

## 2021-01-22 DIAGNOSIS — I2510 Atherosclerotic heart disease of native coronary artery without angina pectoris: Secondary | ICD-10-CM | POA: Diagnosis not present

## 2021-01-22 DIAGNOSIS — E875 Hyperkalemia: Secondary | ICD-10-CM | POA: Diagnosis not present

## 2021-01-22 DIAGNOSIS — N179 Acute kidney failure, unspecified: Secondary | ICD-10-CM | POA: Diagnosis not present

## 2021-01-22 LAB — CBC
HCT: 26.5 % — ABNORMAL LOW (ref 39.0–52.0)
Hemoglobin: 9.4 g/dL — ABNORMAL LOW (ref 13.0–17.0)
MCH: 30.9 pg (ref 26.0–34.0)
MCHC: 35.5 g/dL (ref 30.0–36.0)
MCV: 87.2 fL (ref 80.0–100.0)
Platelets: 446 10*3/uL — ABNORMAL HIGH (ref 150–400)
RBC: 3.04 MIL/uL — ABNORMAL LOW (ref 4.22–5.81)
RDW: 13 % (ref 11.5–15.5)
WBC: 8.4 10*3/uL (ref 4.0–10.5)
nRBC: 0 % (ref 0.0–0.2)

## 2021-01-22 LAB — BASIC METABOLIC PANEL
Anion gap: 5 (ref 5–15)
BUN: 52 mg/dL — ABNORMAL HIGH (ref 6–20)
CO2: 21 mmol/L — ABNORMAL LOW (ref 22–32)
Calcium: 7.5 mg/dL — ABNORMAL LOW (ref 8.9–10.3)
Chloride: 107 mmol/L (ref 98–111)
Creatinine, Ser: 1.88 mg/dL — ABNORMAL HIGH (ref 0.61–1.24)
GFR, Estimated: 42 mL/min — ABNORMAL LOW (ref >60)
Glucose, Bld: 107 mg/dL — ABNORMAL HIGH (ref 70–99)
Potassium: 4.2 mmol/L (ref 3.5–5.1)
Sodium: 133 mmol/L — ABNORMAL LOW (ref 135–145)

## 2021-01-22 LAB — ALBUMIN: Albumin: 1.8 g/dL — ABNORMAL LOW (ref 3.5–5.0)

## 2021-01-22 MED ORDER — SODIUM BICARBONATE 650 MG PO TABS: 650.0000 mg | ORAL_TABLET | Freq: Two times a day (BID) | ORAL | 0 refills | Status: DC

## 2021-01-22 MED ORDER — LISINOPRIL 5 MG PO TABS: 5.0000 mg | ORAL_TABLET | Freq: Every day | ORAL | 1 refills | Status: DC

## 2021-01-22 MED ORDER — OXYCODONE HCL 5 MG PO TABS: 5.0000 mg | ORAL_TABLET | Freq: Four times a day (QID) | ORAL | 0 refills | Status: DC | PRN

## 2021-01-22 MED ORDER — ICOSAPENT ETHYL 1 G PO CAPS: 2.0000 g | ORAL_CAPSULE | Freq: Two times a day (BID) | ORAL | 0 refills | Status: DC

## 2021-01-22 MED ORDER — ROSUVASTATIN CALCIUM 20 MG PO TABS: 20.0000 mg | ORAL_TABLET | Freq: Every day | ORAL | 1 refills | Status: DC

## 2021-01-22 NOTE — Progress Notes (Signed)
Nephrology Follow-Up Consult note   Assessment/Recommendations: Rodney Strickland is a/an 55 y.o. male with a past medical history significant for gout, HTN, CAD s/p CABG rencently, CHF who presents with AKI and hyperkalemia  Nonoliguric AKI on CKD 3 a/proteinuria and hematuria: Initial AKI thought to be related to dehydration along with Lasix/lisinopril use.  However, proteinuria found and was also present in February.  Along with significantly low serum albumin concerning for nephrotic syndrome.  UPC around 10 g.  Consider biopsy inpatient but per IR this may not be till Tuesday.  The patient can be discharged to follow-up with me on Thursday and arrange outpatient biopsy -Creatinine improved today to 1.9 -ANCA negative, further serologies pending -Plan to see me in clinic Thursday -Will arrange outpatient biopsy -Lisinopril 5mg  daily and will consider increasing outpatient  Metabolic acidosis: Likely associated with AKI.  Greatly improved.  Continue sodium bicarb. Will adjust dose in clinic  Pleural effusion: Moderate in size on left side.  Present on CT scan.  Likely related to volume overload.    Status post thoracentesis with bloody fluid.  Work-up per primary team  Recent CABG: CT surgery following.  Appreciate help  Hypertension: Continue current regimen  CHF: EF normal.    Has diastolic dysfunction.  Heart failure team following.  No signs of volume overload or decreased CO on RHC on 3/11   Recommendations conveyed to primary service.    5/11 [facility] 01/22/2021 10:16 AM  ___________________________________________________________  CC: AKI on CKD  Interval History/Subjective: Patient feels about the same today.  Hoping to go home.  Creatinine slightly improved   Medications:  Current Facility-Administered Medications  Medication Dose Route Frequency Provider Last Rate Last Admin  . acetaminophen (TYLENOL) tablet 650 mg  650 mg Oral  Q6H PRN Barrett, Erin R, PA-C      . aspirin EC tablet 81 mg  81 mg Oral Daily Barrett, Erin R, PA-C   81 mg at 01/22/21 0842  . docusate sodium (COLACE) capsule 100 mg  100 mg Oral BID PRN Barrett, Erin R, PA-C   100 mg at 01/22/21 0854  . feeding supplement (ENSURE ENLIVE / ENSURE PLUS) liquid 237 mL  237 mL Oral TID WC Barrett, Erin R, PA-C   237 mL at 01/21/21 1705  . icosapent Ethyl (VASCEPA) 1 g capsule 2 g  2 g Oral BID 03/23/21 M, PA-C   2 g at 01/22/21 01/24/21  . lisinopril (ZESTRIL) tablet 5 mg  5 mg Oral Daily [id number], MD   5 mg at 01/22/21 0842  . loratadine (CLARITIN) tablet 10 mg  10 mg Oral Daily PRN Barrett, Erin R, PA-C      . melatonin tablet 5 mg  5 mg Oral QHS 01/24/21., MD      . metoprolol tartrate (LOPRESSOR) tablet 25 mg  25 mg Oral BID Barrett, Erin R, PA-C   25 mg at 01/22/21 0842  . ondansetron (ZOFRAN) injection 4 mg  4 mg Intravenous Q6H PRN Barrett, Erin R, PA-C      . oxyCODONE (Oxy IR/ROXICODONE) immediate release tablet 5 mg  5 mg Oral Q4H PRN Barrett, Erin R, PA-C   5 mg at 01/19/21 2114  . polyethylene glycol (MIRALAX / GLYCOLAX) packet 17 g  17 g Oral Daily PRN Barrett, Erin R, PA-C   17 g at 01/21/21 2122  . rosuvastatin (CRESTOR) tablet 20 mg  20 mg Oral Daily 2123 M, M  20 mg at 01/22/21 0842  . sodium bicarbonate tablet 650 mg  650 mg Oral BID Darnell Level, MD   650 mg at 01/22/21 0842  . sodium chloride flush (NS) 0.9 % injection 3 mL  3 mL Intravenous Q12H Clegg, Amy D, NP   3 mL at 01/22/21 0848      Review of Systems: 10 systems reviewed and negative except per interval history/subjective  Physical Exam: Vitals:   01/22/21 0611 01/22/21 0842  BP: (!) 143/88 (!) 148/82  Pulse:  89  Resp:    Temp: 98.3 F (36.8 C)   SpO2:     No intake/output data recorded. No intake or output data in the 24 hours ending 01/22/21 1016 Constitutional: Tired but no distress, lying in bed ENMT: ears and nose  without scars or lesions, MMM CV: normal rate, 1+ pitting edema of the bilateral lower extremities Respiratory: Bilateral chest rise with no increased work of breathing Gastrointestinal: soft, non-tender, no palpable masses or hernias Skin: Surgical scar on left forearm and chest, otherwise no visible lesions or rashes Psych: alert, judgement/insight appropriate, appropriate mood and affect   Test Results I personally reviewed new and old clinical labs and radiology tests Lab Results  Component Value Date   NA 133 (L) 01/22/2021   K 4.2 01/22/2021   CL 107 01/22/2021   CO2 21 (L) 01/22/2021   BUN 52 (H) 01/22/2021   CREATININE 1.88 (H) 01/22/2021   CALCIUM 7.5 (L) 01/22/2021   ALBUMIN 1.1 (L) 01/21/2021

## 2021-01-22 NOTE — TOC Initial Note (Signed)
Transition of Care [facility]) - Initial/Assessment Note    Patient Details  Name: Rodney Strickland MRN: [id number] Date of Birth: February 15, 1966  Transition of Care [facility]) CM/SW Contact:    Gala Lewandowsky, RN Phone Number: 01/22/2021, 11:16 AM  Clinical Narrative:  Risk for readmission assessment completed. Prior to arrival patient was from home with spouse and plan is to return home. Case manager discussed with wife regarding home health services and patient declined home health services. Patient will transition home via private vehicle. No further needs from Case Manager at this time.                   Expected Discharge Plan: Home/Self Care Barriers to Discharge: No Barriers Identified   Patient Goals and CMS Choice Patient states their goals for this hospitalization and ongoing recovery are:: to return home   Choice offered to / list presented to : NA  Expected Discharge Plan and Services Expected Discharge Plan: Home/Self Care In-house Referral: NA Discharge Planning Services: CM Consult Post Acute Care Choice: NA Living arrangements for the past 2 months: Single Family Home Expected Discharge Date: 01/22/21                 DME Agency: NA       HH Arranged: Refused HH (Refused HH PT Services)    Prior Living Arrangements/Services Living arrangements for the past 2 months: Single Family Home Lives with:: Spouse Patient language and need for interpreter reviewed:: Yes Do you feel safe going back to the place where you live?: Yes      Need for Family Participation in Patient Care: Yes (Comment) Care giver support system in place?: Yes (comment)   Criminal Activity/Legal Involvement Pertinent to Current Situation/Hospitalization: No - Comment as needed  Activities of Daily Living Home Assistive Devices/Equipment: Walker (specify type),Eyeglasses ADL Screening (condition at time of admission) Patient's cognitive ability adequate to safely complete daily  activities?: Yes Is the patient deaf or have difficulty hearing?: No Does the patient have difficulty seeing, even when wearing glasses/contacts?: No Does the patient have difficulty concentrating, remembering, or making decisions?: No Patient able to express need for assistance with ADLs?: Yes Does the patient have difficulty dressing or bathing?: No Independently performs ADLs?: Yes (appropriate for developmental age) Does the patient have difficulty walking or climbing stairs?: Yes Weakness of Legs: Both Weakness of Arms/Hands: None  Permission Sought/Granted Permission sought to share information with : Family Environmental health practitioner    Emotional Assessment Appearance:: Appears stated age Attitude/Demeanor/Rapport: Engaged Affect (typically observed): Appropriate Orientation: : Oriented to Situation,Oriented to  Time,Oriented to Place,Oriented to Self Alcohol / Substance Use: Not Applicable Psych Involvement: No (comment)  Admission diagnosis:  AKI (acute kidney injury) (HCC) [N17.9] Patient Active Problem List   Diagnosis Date Noted  . Failure to thrive in adult 01/17/2021  . AKI (acute kidney injury) (HCC) 01/17/2021  . Hyperkalemia 01/17/2021  . S/P CABG x 4 01/05/2021  . Abnormal cardiac CT angiography 12/29/2020  . Unstable angina (HCC) 12/08/2020  . Right shoulder pain 11/21/2016   PCP:  Juliann Pares, DO Pharmacy:   [id number] - [place], [place] - [address] [place] [address] Phone: [phone] Fax: [phone]  Readmission Risk Interventions Readmission Risk Prevention Plan 01/22/2021 01/10/2021  Transportation Screening Complete Complete  PCP or Specialist Appt within 5-7 Days Complete Complete  Home Care Screening Complete Complete  Medication Review (RN CM) Complete  Complete  Some recent data might be hidden

## 2021-01-22 NOTE — Progress Notes (Addendum)
      [address]       [place] [address]             [phone]      2 Days Post-Op Procedure(s) (LRB): RIGHT HEART CATH (N/A) Subjective: Frustrated with being in the hospital. Not sure if he wants to stay for the renal biopsy.   Objective: Vital signs in last 24 hours: Temp:  [97.9 F (36.6 C)-98.7 F (37.1 C)] 98.3 F (36.8 C) (03/13 0611) Pulse Rate:  [78-97] 97 (03/12 2025) Cardiac Rhythm: Sinus tachycardia (03/12 1959) Resp:  [15-16] 16 (03/12 2025) BP: (118-159)/(78-93) 143/88 (03/13 0611) SpO2:  [96 %-98 %] 96 % (03/12 2025) Weight:  [80 kg] 80 kg (03/13 0611)     Intake/Output from previous day: No intake/output data recorded. Intake/Output this shift: No intake/output data recorded.  General appearance: alert, cooperative and no distress Heart: regular rate and rhythm, S1, S2 normal, no murmur, click, rub or gallop Lungs: clear to auscultation bilaterally Abdomen: soft, non-tender; bowel sounds normal; no masses,  no organomegaly Extremities: extremities normal, atraumatic, no cyanosis or edema Wound: clean and dry  Lab Results: Recent Labs    01/21/21 0250 01/22/21 0410  WBC 9.2 8.4  HGB 8.4* 9.4*  HCT 24.6* 26.5*  PLT 456* 446*   BMET:  Recent Labs    01/21/21 0250 01/22/21 0410  NA 131* 133*  K 5.4* 4.2  CL 106 107  CO2 20* 21*  GLUCOSE 110* 107*  BUN 61* 52*  CREATININE 2.12* 1.88*  CALCIUM 7.1* 7.5*    PT/INR: No results for input(s): LABPROT, INR in the last 72 hours. ABG    Component Value Date/Time   PHART 7.323 (L) 01/05/2021 1834   HCO3 20.7 01/20/2021 1316   TCO2 22 01/20/2021 1316   ACIDBASEDEF 4.0 (H) 01/20/2021 1316   O2SAT 66.0 01/20/2021 1316   CBG (last 3)  No results for input(s): GLUCAP in the last 72 hours.  Assessment/Plan: S/P Procedure(s) (LRB): RIGHT HEART CATH (N/A)   1. CV- NSR, BP stable- currently on Lopressor, Vascepa, Crestor... AHF following, Right heart cath showed: Low  filling pressures. Preserved cardiac output. No evidence for CHF. HF signed off 3/11.  2. Pulm- S/P Thoracentesis, 1L of fluid removed, post film was free of pneumothorax, small left pleural effusion on CXR this morning. Encouraged use of incentive spirometer. May need a pleurx catheter in the future if the fluid reaccumulates.  3. Renal- creatinine at 1.88 today, currently being worked up for Nephrotic syndrome per Nephrology. Biopsy to be arranged for Monday if he is still here or outpatient if he is discharged today.  4. GI- oral intake improving, no further nausea, continue ensure supplements 5. Hyperkalemia-  4.2, resolved 6. Dispo- After much discussion, ultimately the patient and family do not want to stay until tomorrow unless there is a guarantee he will be able to have the biopsy tomorrow since they have a commitment Tuesday. After discussing with Dr. Valentino Nose, the procedural team has a very busy day tomorrow and they are not sure they would be able to do the renal biopsy tomorrow. For this reason, we will discharge today and let the patient follow-up with Dr. Valentino Nose on Thursday.     LOS: 5 days    Sharlene Dory 01/22/2021   Chart reviewed, patient examined, agree with above. Plan home today.

## 2021-01-22 NOTE — Progress Notes (Signed)
Orthopedic Tech Progress Note Patient Details:  DOLLIE MAYSE 1966-06-02 [id number]  Ortho Devices Type of Ortho Device: Roland Rack boot Ortho Device/Splint Location: Bilateral Lower Extremity Ortho Device/Splint Interventions: Ordered,Application   Post Interventions Patient Tolerated: Well Instructions Provided: Care of device,Poper ambulation with device   Kace Hartje P Harle Stanford 01/22/2021, 1:42 PM

## 2021-01-22 NOTE — Plan of Care (Signed)
Goals met for discharge, given discharge instructions with understanding.

## 2021-01-22 NOTE — Discharge Instructions (Signed)
Discharge Instructions:  1. You may shower, please wash incisions daily with soap and water and keep dry.  If you wish to cover wounds with dressing you may do so but please keep clean and change daily.  No tub baths or swimming until incisions have completely healed.  If your incisions become red or develop any drainage please call our office at [phone]  2. No Driving until cleared by Dr. Atkins office and you are no longer using narcotic pain medications  3. Monitor your weight daily.. Please use the same scale and weigh at same time... If you gain 3-5 lbs in 48 hours with associated lower extremity swelling, please contact our office at [phone]  4. Fever of 101.5 for at least 24 hours with no source, please contact our office at [phone]  5. Activity- up as tolerated, please walk at least 3 times per day.  Avoid strenuous activity, no lifting, pushing, or pulling with your arms over 8-10 lbs for a minimum of 6 weeks  6. If any questions or concerns arise, please do not hesitate to contact our office at [phone] 

## 2021-01-23 ENCOUNTER — Ambulatory Visit: Payer: Federal, State, Local not specified - PPO | Admitting: Cardiothoracic Surgery

## 2021-01-23 LAB — GLUCOSE 6 PHOSPHATE DEHYDROGENASE
G6PDH: 8.9 U/g{Hb} (ref 5.5–14.2)
Hemoglobin: 9.1 g/dL — ABNORMAL LOW (ref 13.0–17.7)

## 2021-01-23 LAB — UIFE/LIGHT CHAINS/TP QN, 24-HR UR
FR KAPPA LT CH,24HR: 302.05 mg/24 hr
FR LAMBDA LT CH,24HR: 160.16 mg/24 hr
Free Kappa Lt Chains,Ur: 302.05 mg/L — ABNORMAL HIGH (ref 0.63–113.79)
Free Kappa/Lambda Ratio: 1.89 (ref 1.03–31.76)
Free Lambda Lt Chains,Ur: 160.16 mg/L — ABNORMAL HIGH (ref 0.47–11.77)
Total Protein, Urine-Ur/day: 9980 mg/24 hr — ABNORMAL HIGH (ref 30–150)
Total Protein, Urine: 998 mg/dL
Total Volume: 1000

## 2021-01-23 MED FILL — Heparin Sod (Porcine)-NaCl IV Soln 1000 Unit/500ML-0.9%: INTRAVENOUS | Qty: 500 | Status: AC

## 2021-01-24 ENCOUNTER — Other Ambulatory Visit (HOSPITAL_COMMUNITY): Payer: Self-pay | Admitting: Internal Medicine

## 2021-01-24 DIAGNOSIS — N049 Nephrotic syndrome with unspecified morphologic changes: Secondary | ICD-10-CM

## 2021-01-24 LAB — MISC LABCORP TEST (SEND OUT): Labcorp test code: 141330

## 2021-01-25 ENCOUNTER — Telehealth (HOSPITAL_COMMUNITY): Payer: Self-pay

## 2021-01-25 LAB — QUANTIFERON-TB GOLD PLUS (RQFGPL)
QuantiFERON Mitogen Value: 0.89 IU/mL
QuantiFERON Nil Value: 0.05 IU/mL
QuantiFERON TB1 Ag Value: 0.33 IU/mL
QuantiFERON TB2 Ag Value: 0.18 IU/mL

## 2021-01-25 LAB — QUANTIFERON-TB GOLD PLUS: QuantiFERON-TB Gold Plus: NEGATIVE

## 2021-01-26 ENCOUNTER — Other Ambulatory Visit: Payer: Self-pay | Admitting: Cardiothoracic Surgery

## 2021-01-26 ENCOUNTER — Telehealth (HOSPITAL_COMMUNITY): Payer: Self-pay

## 2021-01-26 ENCOUNTER — Other Ambulatory Visit: Payer: Self-pay | Admitting: Thoracic Surgery (Cardiothoracic Vascular Surgery)

## 2021-01-26 DIAGNOSIS — N1831 Chronic kidney disease, stage 3a: Secondary | ICD-10-CM | POA: Diagnosis not present

## 2021-01-26 DIAGNOSIS — E872 Acidosis: Secondary | ICD-10-CM | POA: Diagnosis not present

## 2021-01-26 DIAGNOSIS — N049 Nephrotic syndrome with unspecified morphologic changes: Secondary | ICD-10-CM | POA: Diagnosis not present

## 2021-01-26 DIAGNOSIS — I129 Hypertensive chronic kidney disease with stage 1 through stage 4 chronic kidney disease, or unspecified chronic kidney disease: Secondary | ICD-10-CM | POA: Diagnosis not present

## 2021-01-26 DIAGNOSIS — Z951 Presence of aortocoronary bypass graft: Secondary | ICD-10-CM

## 2021-01-26 LAB — CHOLESTEROL, BODY FLUID: Cholesterol, Fluid: 38 mg/dL

## 2021-01-26 NOTE — Progress Notes (Addendum)
Cardiology Office Note:    Date:  01/27/2021   ID:  Rodney Strickland, DOB [birth year], MRN [id number]  PCP:  Juliann Pares, DO   [facility]  Cardiologist:  Kristeen Miss, MD   Electrophysiologist:  None       Referring MD: Juliann Pares, DO   Chief Complaint:  Hospitalization Follow-up (S/p CABG c/b pleural effusion, AKI/nephrotic syndrome, chest wall hematoma, pericarditis)    Patient Profile:    Rodney Strickland is a 55 y.o. male with:   Coronary artery disease   S/p CABG in 12/2020 (L-LAD, Radial-OM1/OM2, RIMA-PDA; L radial harvest)  (HFpEF) heart failure with preserved ejection fraction   Hypertension   Hyperlipidemia w/ high triglycerides   Hx of remote bilateral lung injury   Chronic kidney disease   Possible nephrotic syndrome   admx 3/21 (post CABG) w AKI, hyperK+, low alb, proteinuria ISO ACEi/Loop diuretic  Post CABG L pleural effusion s/p thoracentesis (01/2021)  R chest wall hematoma 3/22  Prior CV studies: Echocardiogram 01/17/21 EF 60-65, no RWMA, Gr 2 DD, normal RVSF, ascending aorta 37 mm  Pre-CABG Dopplers 12/29/20 Bilat ICA 1-39  Echocardiogram 12/29/20 EF 55-60, no RWMA, normal RVSF, trivial MR  Cardiac catheterization 12/29/20 Conclusions: 1. Significant three-vessel coronary artery disease, including sequential 40%-60% mid/distal LAD stenosis as well as 90% LAD lesion near the apex, multifocal proximal-mid LCx disease of up to 90% with significant calcification and tortuosity, 90% proximal and 60% mid RCA lesions, and chronic total occlusion of RCA continuation with left-to-right collaterals filling the distal rPL branches. 2. Normal left ventricular systolic function and filling pressure.    History of Present Illness:    Mr. Whalley was admitted 2/24-3/1 for CABG.  Post op course was notable for pericarditis which was managed with colchicine, expected blood loss anemia and AKI.  He was readmitted 3/8-3/13 with a  L pleural effusion, worsening creatinine, hyperkalemia and significant leg edema.  He had been on furosemide and lisinopril at home.  He was noted to have significant proteinuria and low albumin concerning for nephrotic syndrome.  He was followed by nephrology.  Renal US was normal.  He was also followed by CHF team.  RHC demonstrated low filling pressures eliminating CHF as a cause.  He underwent thoracentesis.  Of note, he also had R chest wall hematoma.   Nephrology recommended renal bx which would be done after DC.  He returns for f/u.   He is here today with his wife.  He saw Dr. Valentino Nose yesterday with nephrology.  They are scheduling a renal biopsy.  His chest remains sore.  It seems to be improving.  He has to sleep sitting up.  His breathing is overall stable.  He continues to have significant edema in bilateral upper and lower extremities.  He also has episodes of nausea.  He has not had syncope.  Past Medical History:  Diagnosis Date  . Coronary artery disease   . Gout   . Hypercholesteremia   . Hypertension   . Injury of both lungs 1986   water in lungs    Current Medications: Current Meds  Medication Sig  . acetaminophen (TYLENOL) 325 MG tablet Take 650 mg by mouth every 6 (six) hours as needed for headache.  . allopurinol (ZYLOPRIM) 100 MG tablet Take 100 mg by mouth daily.  Marland Kitchen aspirin EC 81 MG tablet Take 81 mg by mouth daily. Swallow whole.  . icosapent Ethyl (VASCEPA) 1 g capsule Take 2 capsules (2  g total) by mouth 2 (two) times daily.  Marland Kitchen lisinopril (ZESTRIL) 5 MG tablet Take 1 tablet (5 mg total) by mouth daily.  Marland Kitchen loratadine (CLARITIN) 10 MG tablet Take 10 mg by mouth daily as needed for allergies.  . metoprolol tartrate (LOPRESSOR) 25 MG tablet Take 1.5 tablets (37.5 mg total) by mouth 2 (two) times daily.  . ondansetron (ZOFRAN) 4 MG tablet Take 1 tablet (4 mg total) by mouth every 8 (eight) hours as needed for nausea or vomiting.  Marland Kitchen oxyCODONE (OXY IR/ROXICODONE) 5 MG  immediate release tablet Take 1 tablet (5 mg total) by mouth every 6 (six) hours as needed for severe pain.  . rosuvastatin (CRESTOR) 20 MG tablet Take 1 tablet (20 mg total) by mouth daily.  . sodium bicarbonate 650 MG tablet Take 1 tablet (650 mg total) by mouth 2 (two) times daily.  . vitamin C (ASCORBIC ACID) 500 MG tablet Take 500 mg by mouth daily.  Marland Kitchen zinc gluconate 50 MG tablet Take 50 mg by mouth daily.     Allergies:   Patient has no known allergies.   Social History   Tobacco Use  . Smoking status: Never Smoker  . Smokeless tobacco: Never Used  Vaping Use  . Vaping Use: Never used  Substance Use Topics  . Alcohol use: No  . Drug use: No     Family Hx: The patient's family history includes Diabetes type II in his mother; Hypertension in his father and sister; Stroke in his father.  Review of Systems  Constitutional: Negative for fever.  Respiratory: Negative for cough.   Gastrointestinal: Positive for nausea. Negative for constipation.     EKGs/Labs/Other Test Reviewed:    EKG:  EKG is   ordered today.  The ekg ordered today demonstrates normal sinus rhythm, heart rate 89, normal axis, nonspecific ST-T wave changes, RSR', QTC 382, similar to prior tracings  Recent Labs: 11/26/2020: TSH 3.252 01/03/2021: ALT 13 01/06/2021: Magnesium 2.8 01/19/2021: B Natriuretic Peptide 185.2 01/22/2021: BUN 52; Creatinine, Ser 1.88; Hemoglobin 9.1; Platelets 446; Potassium 4.2; Sodium 133   Recent Lipid Panel Lab Results  Component Value Date/Time   CHOL 295 (H) 12/08/2020 03:52 PM   TRIG 273 (H) 12/08/2020 03:52 PM   HDL 62 12/08/2020 03:52 PM   CHOLHDL 4.8 12/08/2020 03:52 PM   LDLCALC 181 (H) 12/08/2020 03:52 PM    Risk Assessment/Calculations:      Physical Exam:    VS:  BP 110/72   Pulse 89   Ht 5\' 6"  (1.676 m)   Wt 174 lb 6.4 oz (79.1 kg)   SpO2 99%   BMI 28.15 kg/m     Wt Readings from Last 3 Encounters:  01/27/21 174 lb 6.4 oz (79.1 kg)  01/22/21 176 lb  5.9 oz (80 kg)  01/16/21 170 lb 3.2 oz (77.2 kg)     Constitutional:      Appearance: Healthy appearance. Not in distress.  Neck:     Vascular: No JVR.  Pulmonary:     Effort: Pulmonary effort is normal.     Breath sounds: Decreased air movement (L base; +egophony ) present. No wheezing. No rales.  Cardiovascular:     Normal rate. Regular rhythm. Normal S1. Normal S2.     Murmurs: There is no murmur.     No rub.  Edema:    Peripheral edema present.    Thigh: bilateral 1+ edema of the thigh.    Pretibial: bilateral 2+ edema of the pretibial area.  Comments: + Bilateral upper extremity edema  Abdominal:     General: There is distension.     Palpations: Abdomen is soft.  Musculoskeletal:     Cervical back: Neck supple. Skin:    General: Skin is warm and dry.  Neurological:     General: No focal deficit present.     Mental Status: Alert and oriented to person, place and time.     Cranial Nerves: Cranial nerves are intact.       ASSESSMENT & PLAN:    1. Coronary artery disease involving native coronary artery of native heart without angina pectoris Status post CABG in 12/2020.  He was initially treated for pericarditis with colchicine.  He was then readmitted with worsening renal function, anasarca and left pleural effusion.  He had significant proteinuria and it was felt that his volume excess was related to probable nephrotic syndrome.  His nitrates were stopped.  His aspirin was reduced to 81 mg daily in the setting of AKI.  From a cardiac standpoint, he is overall stable.  He remains weak and at this point, I think he should hold off on cardiac rehab for now.  I have encouraged him to continue to increase his activity at home.  When he is seen back with Dr. Elease Hashimoto in April, we can make a decision about cardiac rehab.  Continue current dose of aspirin, Vascepa, metoprolol tartrate, rosuvastatin.  Keep appointment with Dr. Elease Hashimoto 03/07/2021.  He has an appointment next week with Dr.  Vickey Sages.  2. Stage 3a chronic kidney disease (HCC) 3. Nephrotic range proteinuria As noted, he is thought to have nephrotic syndrome and was followed closely by nephrology.  He had a follow-up appointment yesterday.  We will request labs from that visit.  He is getting set up for renal biopsy.  He is now back on ACE inhibitor therapy with lisinopril.  He continues to have significant swelling.  4. Chronic heart failure with preserved ejection fraction (HCC) EF 60-65 by echocardiogram 3/22.  He had a right heart catheterization during most recent hospitalization with low filling pressures.  His overall picture was not related to heart failure.  He remains off diuretic therapy.  5. Essential hypertension The patient's blood pressure is controlled on his current regimen.  Continue current therapy.   6. Mixed hyperlipidemia Continue rosuvastatin, Vascepa.  Arrange CMET, lipids in 3 months.   7. L Pleural effusion S/p thoracentesis.  On exam, he likely has some residual effusion.  He has an appt with Dr. Vickey Sages Monday.  I will not get a CXR today as I suspect he will have a repeat CXR on Monday.     Dispo:  Return in 6 weeks (on 03/07/2021) for Scheduled Follow Up, w/ Dr. Elease Hashimoto.   Medication Adjustments/Labs and Tests Ordered: Current medicines are reviewed at length with the patient today.  Concerns regarding medicines are outlined above.  Tests Ordered: Orders Placed This Encounter  Procedures  . Comprehensive metabolic panel  . Lipid panel  . EKG 12-Lead   Medication Changes: No orders of the defined types were placed in this encounter.   Signed, Tereso Newcomer, PA-C  01/27/2021 1:08 PM    [facility] [address] [place], [place], [place]  [address] Phone: [phone]; Fax: [phone]

## 2021-01-27 ENCOUNTER — Ambulatory Visit: Payer: Federal, State, Local not specified - PPO | Admitting: Physician Assistant

## 2021-01-27 ENCOUNTER — Telehealth (HOSPITAL_COMMUNITY): Payer: Self-pay

## 2021-01-27 ENCOUNTER — Telehealth: Payer: Self-pay | Admitting: *Deleted

## 2021-01-27 ENCOUNTER — Other Ambulatory Visit: Payer: Self-pay

## 2021-01-27 ENCOUNTER — Encounter: Payer: Self-pay | Admitting: Physician Assistant

## 2021-01-27 VITALS — BP 110/72 | HR 89 | Ht 66.0 in | Wt 174.4 lb

## 2021-01-27 DIAGNOSIS — R809 Proteinuria, unspecified: Secondary | ICD-10-CM | POA: Diagnosis not present

## 2021-01-27 DIAGNOSIS — I2510 Atherosclerotic heart disease of native coronary artery without angina pectoris: Secondary | ICD-10-CM

## 2021-01-27 DIAGNOSIS — N1831 Chronic kidney disease, stage 3a: Secondary | ICD-10-CM

## 2021-01-27 DIAGNOSIS — I5032 Chronic diastolic (congestive) heart failure: Secondary | ICD-10-CM | POA: Diagnosis not present

## 2021-01-27 DIAGNOSIS — E782 Mixed hyperlipidemia: Secondary | ICD-10-CM

## 2021-01-27 DIAGNOSIS — J90 Pleural effusion, not elsewhere classified: Secondary | ICD-10-CM

## 2021-01-27 DIAGNOSIS — I10 Essential (primary) hypertension: Secondary | ICD-10-CM

## 2021-01-27 NOTE — Telephone Encounter (Signed)
S/w Dawn at Dr. Valentino Nose office @ [phone] to get pt's labs.  Labs were drawn yesterday and not ready. Will call back on Monday to get labs faxed to office. Called [facility] and lvm @ [phone] to get pt seen for a New PCP.  [facility] is to call pt back to set pt up.

## 2021-01-27 NOTE — Patient Instructions (Addendum)
Medication Instructions:  Your physician recommends that you continue on your current medications as directed. Please refer to the Current Medication list given to you today.  *If you need a refill on your cardiac medications before your next appointment, please call your pharmacy*   Lab Work: Your physician recommends that you return for a FASTING lipid profile/cmet on Thursday, June 2 between 7:30 - 4:30 fasting after 12 from the night before.     If you have labs (blood work) drawn today and your tests are completely normal, you will receive your results only by: Marland Kitchen MyChart Message (if you have MyChart) OR . A paper copy in the mail If you have any lab test that is abnormal or we need to change your treatment, we will call you to review the results.   Testing/Procedures: -None   Follow-Up: At [facility], you and your health needs are our priority.  As part of our continuing mission to provide you with exceptional heart care, we have created designated Provider Care Teams.  These Care Teams include your primary Cardiologist (physician) and Advanced Practice Providers (APPs -  Physician Assistants and Nurse Practitioners) who all work together to provide you with the care you need, when you need it.  We recommend signing up for the patient portal called "MyChart".  Sign up information is provided on this After Visit Summary.  MyChart is used to connect with patients for Virtual Visits (Telemedicine).  Patients are able to view lab/test results, encounter notes, upcoming appointments, etc.  Non-urgent messages can be sent to your provider as well.   To learn more about what you can do with MyChart, go to [email or web address].    Your next appointment:    Your physician recommends that you keep your scheduled  follow-up appointment  With Dr. Elease Hashimoto on April 26 @ 3:40.       Other Instructions [facility] was called @ [phone] and left voice message to call  the patient back with a new Primary Care Doctor.

## 2021-01-30 ENCOUNTER — Telehealth: Payer: Self-pay | Admitting: *Deleted

## 2021-01-30 ENCOUNTER — Encounter: Payer: Self-pay | Admitting: Cardiothoracic Surgery

## 2021-01-30 NOTE — Telephone Encounter (Signed)
Will fax pt's recent labs results.

## 2021-02-06 ENCOUNTER — Other Ambulatory Visit: Payer: Self-pay | Admitting: Student

## 2021-02-06 DIAGNOSIS — N1831 Chronic kidney disease, stage 3a: Secondary | ICD-10-CM | POA: Diagnosis not present

## 2021-02-06 DIAGNOSIS — Z1331 Encounter for screening for depression: Secondary | ICD-10-CM | POA: Diagnosis not present

## 2021-02-06 DIAGNOSIS — Z1339 Encounter for screening examination for other mental health and behavioral disorders: Secondary | ICD-10-CM | POA: Diagnosis not present

## 2021-02-06 DIAGNOSIS — I25810 Atherosclerosis of coronary artery bypass graft(s) without angina pectoris: Secondary | ICD-10-CM | POA: Diagnosis not present

## 2021-02-06 DIAGNOSIS — R809 Proteinuria, unspecified: Secondary | ICD-10-CM | POA: Diagnosis not present

## 2021-02-07 ENCOUNTER — Telehealth: Payer: Self-pay

## 2021-02-07 ENCOUNTER — Encounter (HOSPITAL_COMMUNITY): Payer: Self-pay

## 2021-02-07 ENCOUNTER — Other Ambulatory Visit (HOSPITAL_COMMUNITY): Payer: Self-pay | Admitting: Internal Medicine

## 2021-02-07 ENCOUNTER — Other Ambulatory Visit: Payer: Self-pay

## 2021-02-07 ENCOUNTER — Ambulatory Visit (HOSPITAL_COMMUNITY)
Admission: RE | Admit: 2021-02-07 | Discharge: 2021-02-07 | Disposition: A | Discharge status: Home / Self Care | Payer: Federal, State, Local not specified - PPO | Source: Ambulatory Visit | Attending: Internal Medicine | Admitting: Internal Medicine

## 2021-02-07 DIAGNOSIS — R809 Proteinuria, unspecified: Secondary | ICD-10-CM | POA: Diagnosis not present

## 2021-02-07 DIAGNOSIS — N049 Nephrotic syndrome with unspecified morphologic changes: Secondary | ICD-10-CM | POA: Insufficient documentation

## 2021-02-07 DIAGNOSIS — Z87441 Personal history of nephrotic syndrome: Secondary | ICD-10-CM | POA: Diagnosis not present

## 2021-02-07 LAB — PROTIME-INR
INR: 1.1 (ref 0.8–1.2)
Prothrombin Time: 13.6 seconds (ref 11.4–15.2)

## 2021-02-07 LAB — CBC
HCT: 24.5 % — ABNORMAL LOW (ref 39.0–52.0)
Hemoglobin: 8.1 g/dL — ABNORMAL LOW (ref 13.0–17.0)
MCH: 30.1 pg (ref 26.0–34.0)
MCHC: 33.1 g/dL (ref 30.0–36.0)
MCV: 91.1 fL (ref 80.0–100.0)
Platelets: 361 10*3/uL (ref 150–400)
RBC: 2.69 MIL/uL — ABNORMAL LOW (ref 4.22–5.81)
RDW: 12.5 % (ref 11.5–15.5)
WBC: 8.8 10*3/uL (ref 4.0–10.5)
nRBC: 0 % (ref 0.0–0.2)

## 2021-02-07 MED ORDER — GELATIN ABSORBABLE 12-7 MM EX MISC
CUTANEOUS | Status: AC
  Filled 2021-02-07: qty 1

## 2021-02-07 MED ORDER — LIDOCAINE HCL (PF) 1 % IJ SOLN
INTRAMUSCULAR | Status: AC
  Filled 2021-02-07: qty 30

## 2021-02-07 MED ORDER — FENTANYL CITRATE (PF) 100 MCG/2ML IJ SOLN
INTRAMUSCULAR | Status: AC
  Filled 2021-02-07: qty 2

## 2021-02-07 MED ORDER — MIDAZOLAM HCL 2 MG/2ML IJ SOLN
INTRAMUSCULAR | Status: AC
  Filled 2021-02-07: qty 2

## 2021-02-07 MED ORDER — SODIUM CHLORIDE 0.9 % IV SOLN: INTRAVENOUS | Status: DC

## 2021-02-07 NOTE — Progress Notes (Signed)
Patient scheduled for kidney biopsy in ultrasound with Marliss Coots, MD. Prior to procedure, patient reported taking aspirin 81mg  PO this morning. Per patient's wife, patient took aspirin 02/06/21. Per 02/08/21, procedure to be aborted due to aspirin not being held for 3-5 days. Spoke with patient's wife and patient about rationale for aborting procedure. Patient and patient's wife verbalized understanding. Schedulers to follow up with patient to reschedule procedure.

## 2021-02-07 NOTE — Progress Notes (Signed)
Upon time-out prior to procedure it was discovered Mr. Derasmo has only held his aspirin this morning.  As per [facility] Anticoagulation Guidelines, solid organ biopsies require 3-5 days aspirin hold.    Our schedulers will reach out with further instructions.  Marliss Coots, MD Pager: [phone]

## 2021-02-07 NOTE — Telephone Encounter (Signed)
Patient's wife contacted the office requesting his appointment be moved to next Thursday due to a conflict with a prior medical appointment that needed to take place.  She was unsure if he should wait another week since patient has already missed his follow-up appointment twice before due to other medical issues. Asked how his wounds looked and if he was having any more shortness of breath since he had a Thoracentesis post surgery. She stated that his wounds look ok, but she does notice at times where after cleaning the area and showering, he will have stains from the sternal wound draining, minimal amount.  No fever, no smell to drainage, and no increased pain.  He was not experiencing any shortness of breath while inactive and only mild shortness of breath when active.  Advised to send a picture of his sternal wounds to the office RN phone for Dr. Vickey Sages to take a look at.  Dr. Vickey Sages aware of the incision and minimal drainage and advised incisions looked well and patient okayed to wait another week to be seen in the office.  Patient's wife acknowledged receipt and stated that she would give the office a call back if anything changes in his status, otherwise patient will be back in the office 02/16/21 for f/u with Dr. Vickey Sages.
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
## 2021-02-08 ENCOUNTER — Other Ambulatory Visit: Payer: Self-pay | Admitting: Radiology

## 2021-02-08 ENCOUNTER — Other Ambulatory Visit: Payer: Self-pay | Admitting: Student

## 2021-02-09 ENCOUNTER — Encounter (HOSPITAL_COMMUNITY): Payer: Self-pay

## 2021-02-09 ENCOUNTER — Other Ambulatory Visit: Payer: Self-pay | Admitting: Physician Assistant

## 2021-02-09 ENCOUNTER — Encounter: Payer: Self-pay | Admitting: Cardiothoracic Surgery

## 2021-02-09 ENCOUNTER — Other Ambulatory Visit: Payer: Self-pay

## 2021-02-09 ENCOUNTER — Ambulatory Visit (HOSPITAL_COMMUNITY)
Admission: RE | Admit: 2021-02-09 | Discharge: 2021-02-09 | Disposition: A | Discharge status: Home / Self Care | Payer: Federal, State, Local not specified - PPO | Source: Ambulatory Visit | Attending: Internal Medicine | Admitting: Internal Medicine

## 2021-02-09 DIAGNOSIS — D649 Anemia, unspecified: Secondary | ICD-10-CM | POA: Insufficient documentation

## 2021-02-09 DIAGNOSIS — Z951 Presence of aortocoronary bypass graft: Secondary | ICD-10-CM | POA: Insufficient documentation

## 2021-02-09 DIAGNOSIS — Z7982 Long term (current) use of aspirin: Secondary | ICD-10-CM | POA: Insufficient documentation

## 2021-02-09 DIAGNOSIS — N269 Renal sclerosis, unspecified: Secondary | ICD-10-CM | POA: Diagnosis not present

## 2021-02-09 DIAGNOSIS — E785 Hyperlipidemia, unspecified: Secondary | ICD-10-CM | POA: Insufficient documentation

## 2021-02-09 DIAGNOSIS — I2510 Atherosclerotic heart disease of native coronary artery without angina pectoris: Secondary | ICD-10-CM | POA: Insufficient documentation

## 2021-02-09 DIAGNOSIS — R809 Proteinuria, unspecified: Secondary | ICD-10-CM | POA: Diagnosis not present

## 2021-02-09 DIAGNOSIS — Z79899 Other long term (current) drug therapy: Secondary | ICD-10-CM | POA: Diagnosis not present

## 2021-02-09 DIAGNOSIS — N189 Chronic kidney disease, unspecified: Secondary | ICD-10-CM | POA: Insufficient documentation

## 2021-02-09 DIAGNOSIS — M109 Gout, unspecified: Secondary | ICD-10-CM | POA: Diagnosis not present

## 2021-02-09 DIAGNOSIS — I509 Heart failure, unspecified: Secondary | ICD-10-CM | POA: Diagnosis not present

## 2021-02-09 DIAGNOSIS — I130 Hypertensive heart and chronic kidney disease with heart failure and stage 1 through stage 4 chronic kidney disease, or unspecified chronic kidney disease: Secondary | ICD-10-CM | POA: Insufficient documentation

## 2021-02-09 DIAGNOSIS — N049 Nephrotic syndrome with unspecified morphologic changes: Secondary | ICD-10-CM | POA: Diagnosis not present

## 2021-02-09 DIAGNOSIS — R600 Localized edema: Secondary | ICD-10-CM | POA: Diagnosis not present

## 2021-02-09 DIAGNOSIS — N179 Acute kidney failure, unspecified: Secondary | ICD-10-CM | POA: Diagnosis not present

## 2021-02-09 MED ORDER — MIDAZOLAM HCL 2 MG/2ML IJ SOLN
INTRAMUSCULAR | Status: AC
  Filled 2021-02-09: qty 2

## 2021-02-09 MED ORDER — FENTANYL CITRATE (PF) 100 MCG/2ML IJ SOLN
INTRAMUSCULAR | Status: AC
  Filled 2021-02-09: qty 2

## 2021-02-09 MED ORDER — GELATIN ABSORBABLE 12-7 MM EX MISC
CUTANEOUS | Status: AC
  Filled 2021-02-09: qty 1

## 2021-02-09 MED ORDER — FENTANYL CITRATE (PF) 100 MCG/2ML IJ SOLN
INTRAMUSCULAR | Status: AC | PRN
  Administered 2021-02-09: 25 ug via INTRAVENOUS

## 2021-02-09 MED ORDER — LIDOCAINE HCL (PF) 1 % IJ SOLN
INTRAMUSCULAR | Status: AC
  Filled 2021-02-09: qty 30

## 2021-02-09 MED ORDER — SODIUM CHLORIDE 0.9 % IV SOLN: INTRAVENOUS | Status: DC

## 2021-02-09 MED ORDER — MIDAZOLAM HCL 2 MG/2ML IJ SOLN
INTRAMUSCULAR | Status: AC | PRN
  Administered 2021-02-09: 1 mg via INTRAVENOUS

## 2021-02-09 NOTE — Discharge Instructions (Addendum)
Percutaneous Kidney Biopsy, Care After This sheet gives you information about how to care for yourself after your procedure. Your health care provider may also give you more specific instructions. If you have problems or questions, contact your health care provider. What can I expect after the procedure? After the procedure, it is common to have:  Pain or soreness near the biopsy site.  Pink or cloudy urine for 24 hours after the procedure. This is normal. Follow these instructions at home: Activity  Return to your normal activities as told by your health care provider. Ask your health care provider what activities are safe for you.  If you were given a sedative during the procedure, it can affect you for several hours. Do not drive or operate machinery until your health care provider says that it is safe.  Do not lift anything that is heavier than 10 lb (4.5 kg), or the limit that you are told, until your health care provider says that it is safe.  Avoid activities that take a lot of effort until your health care provider approves. Most people will have to wait 2 weeks before returning to activities such as exercise or sex. General instructions  Take over-the-counter and prescription medicines only as told by your health care provider.  Follow instructions from your health care provider about eating or drinking restrictions.  Check your biopsy site every day for signs of infection. Check for: ? More redness, swelling, or pain. ? Fluid or blood. ? Warmth. ? Pus or a bad smell.  Keep all follow-up visits as told by your health care provider. This is important.   Contact a health care provider if:  You have more redness, swelling, or pain around your biopsy site.  You have fluid or blood coming from your biopsy site.  Your biopsy site feels warm to the touch.  You have pus or a bad smell coming from your biopsy site.  You have blood in your urine more than 24 hours after your  procedure. Get help right away if:  Your urine is dark red or brown.  You have a fever.  You are not able to urinate.  You feel burning when you urinate.  You feel dizzy or light-headed.  You have severe pain in your abdomen or side. Summary  After the procedure, it is common to have pain or soreness at the biopsy site and pink or cloudy urine for the first 24 hours.  Check your biopsy site each day for signs of infection, such as more redness, swelling, or pain; fluid, blood, pus or a bad smell coming from the biopsy site; or the biopsy site feeling warm to the touch.  Return to your normal activities as told by your health care provider. This information is not intended to replace advice given to you by your health care provider. Make sure you discuss any questions you have with your health care provider. Document Revised: 01/22/2020 Document Reviewed: 01/22/2020 Elsevier Patient Education  2021 [employer]. Moderate Conscious Sedation, Adult Sedation is the use of medicines to promote relaxation and to relieve discomfort and anxiety. Moderate conscious sedation is a type of sedation. Under moderate conscious sedation, you are less alert than normal, but you are still able to respond to instructions, touch, or both. Moderate conscious sedation is used during short medical and dental procedures. It is milder than deep sedation, which is a type of sedation under which you cannot be easily woken up. It is also milder than   general anesthesia, which is the use of medicines to make you unconscious. Moderate conscious sedation allows you to return to your regular activities sooner. Tell a health care provider about:  Any allergies you have.  All medicines you are taking, including vitamins, herbs, eye drops, creams, and over-the-counter medicines.  Any use of steroids. This includes steroids taken by mouth or as a cream.  Any problems you or family members have had with sedatives and  anesthetic medicines.  Any blood disorders you have.  Any surgeries you have had.  Any medical conditions you have, such as sleep apnea.  Whether you are pregnant or may be pregnant.  Any use of cigarettes, alcohol, marijuana, or drugs. What are the risks? Generally, this is a safe procedure. However, problems may occur, including:  Getting too much medicine (oversedation).  Nausea.  Allergic reaction to medicines.  Trouble breathing. If this happens, a breathing tube may be used. It will be removed when you are awake and breathing on your own.  Heart trouble.  Lung trouble.  Confusion that gets better with time (emergence delirium). What happens before the procedure? Staying hydrated Follow instructions from your health care provider about hydration, which may include:  Up to 2 hours before the procedure - you may continue to drink clear liquids, such as water, clear fruit juice, black coffee, and plain tea. Eating and drinking restrictions Follow instructions from your health care provider about eating and drinking, which may include:  8 hours before the procedure - stop eating heavy meals or foods, such as meat, fried foods, or fatty foods.  6 hours before the procedure - stop eating light meals or foods, such as toast or cereal.  6 hours before the procedure - stop drinking milk or drinks that contain milk.  2 hours before the procedure - stop drinking clear liquids. Medicines Ask your health care provider about:  Changing or stopping your regular medicines. This is especially important if you are taking diabetes medicines or blood thinners.  Taking medicines such as aspirin and ibuprofen. These medicines can thin your blood. Do not take these medicines unless your health care provider tells you to take them.  Taking over-the-counter medicines, vitamins, herbs, and supplements. Tests and exams  You will have a physical exam.  You may have blood tests done to  show how well: ? Your kidneys and liver work. ? Your blood clots. General instructions  Plan to have a responsible adult take you home from the hospital or clinic.  If you will be going home right after the procedure, plan to have a responsible adult care for you for the time you are told. This is important. What happens during the procedure?  You will be given the sedative. The sedative may be given: ? As a pill that you will swallow. It can also be inserted into the rectum. ? As a spray through the nose. ? As an injection into the muscle. ? As an injection into the vein through an IV.  You may be given oxygen as needed.  Your breathing, heart rate, and blood pressure will be monitored during the procedure.  The medical or dental procedure will be done. The procedure may vary among health care providers and hospitals.   What happens after the procedure?  Your blood pressure, heart rate, breathing rate, and blood oxygen level will be monitored until you leave the hospital or clinic.  You will get fluids through your IV if needed.  Do not drive   or operate machinery until your health care provider says that it is safe. Summary  Sedation is the use of medicines to promote relaxation and to relieve discomfort and anxiety. Moderate conscious sedation is a type of sedation that is used during short medical and dental procedures.  Tell the health care provider about any medical conditions that you have and about all the medicines that you are taking.  You will be given the sedative as a pill, a spray through the nose, an injection into the muscle, or an injection into the vein through an IV. Vital signs are monitored during the sedation.  Moderate conscious sedation allows you to return to your regular activities sooner. This information is not intended to replace advice given to you by your health care provider. Make sure you discuss any questions you have with your health care  provider. Document Revised: 02/26/2020 Document Reviewed: 09/24/2019 Elsevier Patient Education  2021 Elsevier Inc.  

## 2021-02-09 NOTE — Procedures (Signed)
Interventional Radiology Procedure Note  Procedure: [place] RANDOM RENAL BX    Complications: None  Estimated Blood Loss:  MIN  Findings: 16G CORE X 2    M. Ruel Favors, MD

## 2021-02-09 NOTE — Sedation Documentation (Signed)
Attempted to call report to Short Stay Department for handoff. Unit unable to take report at this time. Awaiting call back.
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
## 2021-02-12 ENCOUNTER — Other Ambulatory Visit: Payer: Self-pay | Admitting: Physician Assistant

## 2021-02-16 ENCOUNTER — Other Ambulatory Visit: Payer: Self-pay

## 2021-02-16 ENCOUNTER — Ambulatory Visit
Admission: RE | Admit: 2021-02-16 | Discharge: 2021-02-16 | Disposition: A | Discharge status: Home / Self Care | Payer: Federal, State, Local not specified - PPO | Source: Ambulatory Visit | Attending: Cardiothoracic Surgery | Admitting: Cardiothoracic Surgery

## 2021-02-16 ENCOUNTER — Ambulatory Visit (INDEPENDENT_AMBULATORY_CARE_PROVIDER_SITE_OTHER): Payer: Self-pay | Admitting: Cardiothoracic Surgery

## 2021-02-16 VITALS — BP 108/74 | HR 72 | Resp 20 | Ht 66.0 in | Wt 185.0 lb

## 2021-02-16 DIAGNOSIS — I25119 Atherosclerotic heart disease of native coronary artery with unspecified angina pectoris: Secondary | ICD-10-CM

## 2021-02-16 DIAGNOSIS — I129 Hypertensive chronic kidney disease with stage 1 through stage 4 chronic kidney disease, or unspecified chronic kidney disease: Secondary | ICD-10-CM | POA: Diagnosis not present

## 2021-02-16 DIAGNOSIS — N179 Acute kidney failure, unspecified: Secondary | ICD-10-CM | POA: Diagnosis not present

## 2021-02-16 DIAGNOSIS — N1831 Chronic kidney disease, stage 3a: Secondary | ICD-10-CM | POA: Diagnosis not present

## 2021-02-16 DIAGNOSIS — Z951 Presence of aortocoronary bypass graft: Secondary | ICD-10-CM

## 2021-02-16 DIAGNOSIS — N050 Unspecified nephritic syndrome with minor glomerular abnormality: Secondary | ICD-10-CM | POA: Diagnosis not present

## 2021-02-17 ENCOUNTER — Other Ambulatory Visit: Payer: Self-pay | Admitting: Physician Assistant

## 2021-02-20 ENCOUNTER — Other Ambulatory Visit: Payer: Self-pay | Admitting: Physician Assistant

## 2021-02-22 LAB — SURGICAL PATHOLOGY

## 2021-02-27 DIAGNOSIS — N1831 Chronic kidney disease, stage 3a: Secondary | ICD-10-CM | POA: Diagnosis not present

## 2021-02-27 DIAGNOSIS — I1310 Hypertensive heart and chronic kidney disease without heart failure, with stage 1 through stage 4 chronic kidney disease, or unspecified chronic kidney disease: Secondary | ICD-10-CM | POA: Diagnosis not present

## 2021-02-27 DIAGNOSIS — R809 Proteinuria, unspecified: Secondary | ICD-10-CM | POA: Diagnosis not present

## 2021-02-28 ENCOUNTER — Encounter (HOSPITAL_COMMUNITY): Payer: Self-pay

## 2021-03-01 DIAGNOSIS — N1831 Chronic kidney disease, stage 3a: Secondary | ICD-10-CM | POA: Diagnosis not present

## 2021-03-01 DIAGNOSIS — N179 Acute kidney failure, unspecified: Secondary | ICD-10-CM | POA: Diagnosis not present

## 2021-03-01 DIAGNOSIS — N050 Unspecified nephritic syndrome with minor glomerular abnormality: Secondary | ICD-10-CM | POA: Diagnosis not present

## 2021-03-01 DIAGNOSIS — I129 Hypertensive chronic kidney disease with stage 1 through stage 4 chronic kidney disease, or unspecified chronic kidney disease: Secondary | ICD-10-CM | POA: Diagnosis not present

## 2021-03-07 ENCOUNTER — Ambulatory Visit: Payer: Federal, State, Local not specified - PPO | Admitting: Cardiovascular Disease

## 2021-03-07 ENCOUNTER — Other Ambulatory Visit: Payer: Self-pay

## 2021-03-07 ENCOUNTER — Encounter: Payer: Self-pay | Admitting: Cardiovascular Disease

## 2021-03-07 VITALS — BP 98/64 | HR 74 | Ht 66.0 in | Wt 139.0 lb

## 2021-03-07 DIAGNOSIS — S21101A Unspecified open wound of right front wall of thorax without penetration into thoracic cavity, initial encounter: Secondary | ICD-10-CM | POA: Diagnosis not present

## 2021-03-07 DIAGNOSIS — L089 Local infection of the skin and subcutaneous tissue, unspecified: Secondary | ICD-10-CM

## 2021-03-07 DIAGNOSIS — I2510 Atherosclerotic heart disease of native coronary artery without angina pectoris: Secondary | ICD-10-CM | POA: Diagnosis not present

## 2021-03-07 DIAGNOSIS — Z951 Presence of aortocoronary bypass graft: Secondary | ICD-10-CM | POA: Diagnosis not present

## 2021-03-07 MED ORDER — DOXYCYCLINE HYCLATE 100 MG PO TBEC: 100.0000 mg | DELAYED_RELEASE_TABLET | Freq: Two times a day (BID) | ORAL | 1 refills | Status: DC

## 2021-03-07 MED ORDER — METOPROLOL TARTRATE 25 MG PO TABS: 25.0000 mg | ORAL_TABLET | Freq: Two times a day (BID) | ORAL | 3 refills | Status: DC

## 2021-03-07 MED ORDER — MUPIROCIN CALCIUM 2 % EX CREA: 1.0000 "application " | TOPICAL_CREAM | Freq: Two times a day (BID) | CUTANEOUS | 0 refills | Status: DC
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
## 2021-03-07 NOTE — Patient Instructions (Signed)
Medication Instructions:  Your physician has recommended you make the following change in your medication:   STOP Lasix START Mupirocin cream twice a day START Doxycycline 100mg  twice a day for 10 days.   *If you need a refill on your cardiac medications before your next appointment, please call your pharmacy*   Lab Work: none If you have labs (blood work) drawn today and your tests are completely normal, you will receive your results only by: MyChart Message (if you have MyChart) OR . A paper copy in the mail If you have any lab test that is abnormal or we need to change your treatment, we will call you to review the results.   Testing/Procedures: none   Follow-Up: At [facility], you and your health needs are our priority.  As part of our continuing mission to provide you with exceptional heart care, we have created designated Provider Care Teams.  These Care Teams include your primary Cardiologist (physician) and Advanced Practice Providers (APPs -  Physician Assistants and Nurse Practitioners) who all work together to provide you with the care you need, when you need it.   Your next appointment:   4-6 week(s)  The format for your next appointment:   In Person  Provider:   You may see [facility], MD or one of the following Advanced Practice Providers on your designated Care Team:    Kristeen Miss, PA-C  Vin Tab, [place]

## 2021-03-08 ENCOUNTER — Telehealth: Payer: Self-pay | Admitting: Cardiovascular Disease

## 2021-03-08 NOTE — Telephone Encounter (Signed)
Received forms via fax. Called patient and left a message to inform him of the $29 fee and authorization needed to complete the form. AO 03/08/21

## 2021-03-15 DIAGNOSIS — N179 Acute kidney failure, unspecified: Secondary | ICD-10-CM | POA: Diagnosis not present

## 2021-03-20 ENCOUNTER — Telehealth (HOSPITAL_COMMUNITY): Payer: Self-pay

## 2021-03-20 NOTE — Telephone Encounter (Signed)
Pt wife called and wanting to know about the cardiac rehab program for her husband explain to pt about the cardiac rehab program, but she didn't seem like she fully understood, she kept asking what could the pt be doing at home because she stated she didn't think he needed cardiac rehab but she wanted advise of what the pt can do at home. I advised her that we cant tell him what to do because we haven't evaluated him and the only thing we can tell him to do is what they told him in the hospital and that is walk for a least 30 mins a day.

## 2021-03-21 ENCOUNTER — Other Ambulatory Visit (HOSPITAL_COMMUNITY): Payer: Self-pay

## 2021-03-21 DIAGNOSIS — I129 Hypertensive chronic kidney disease with stage 1 through stage 4 chronic kidney disease, or unspecified chronic kidney disease: Secondary | ICD-10-CM | POA: Diagnosis not present

## 2021-03-21 DIAGNOSIS — N1831 Chronic kidney disease, stage 3a: Secondary | ICD-10-CM | POA: Diagnosis not present

## 2021-03-21 DIAGNOSIS — N179 Acute kidney failure, unspecified: Secondary | ICD-10-CM | POA: Diagnosis not present

## 2021-03-21 DIAGNOSIS — N050 Unspecified nephritic syndrome with minor glomerular abnormality: Secondary | ICD-10-CM | POA: Diagnosis not present

## 2021-03-29 IMAGING — CR DG CHEST 2V
2 series · 2 of 2 positions shown · non-contrast
Comparison: 11/26/2020

CLINICAL DATA: Angina pectoris. Coronary artery disease. Pre-op
respiratory exam

EXAM:
CHEST - 2 VIEW

[w chest pa]
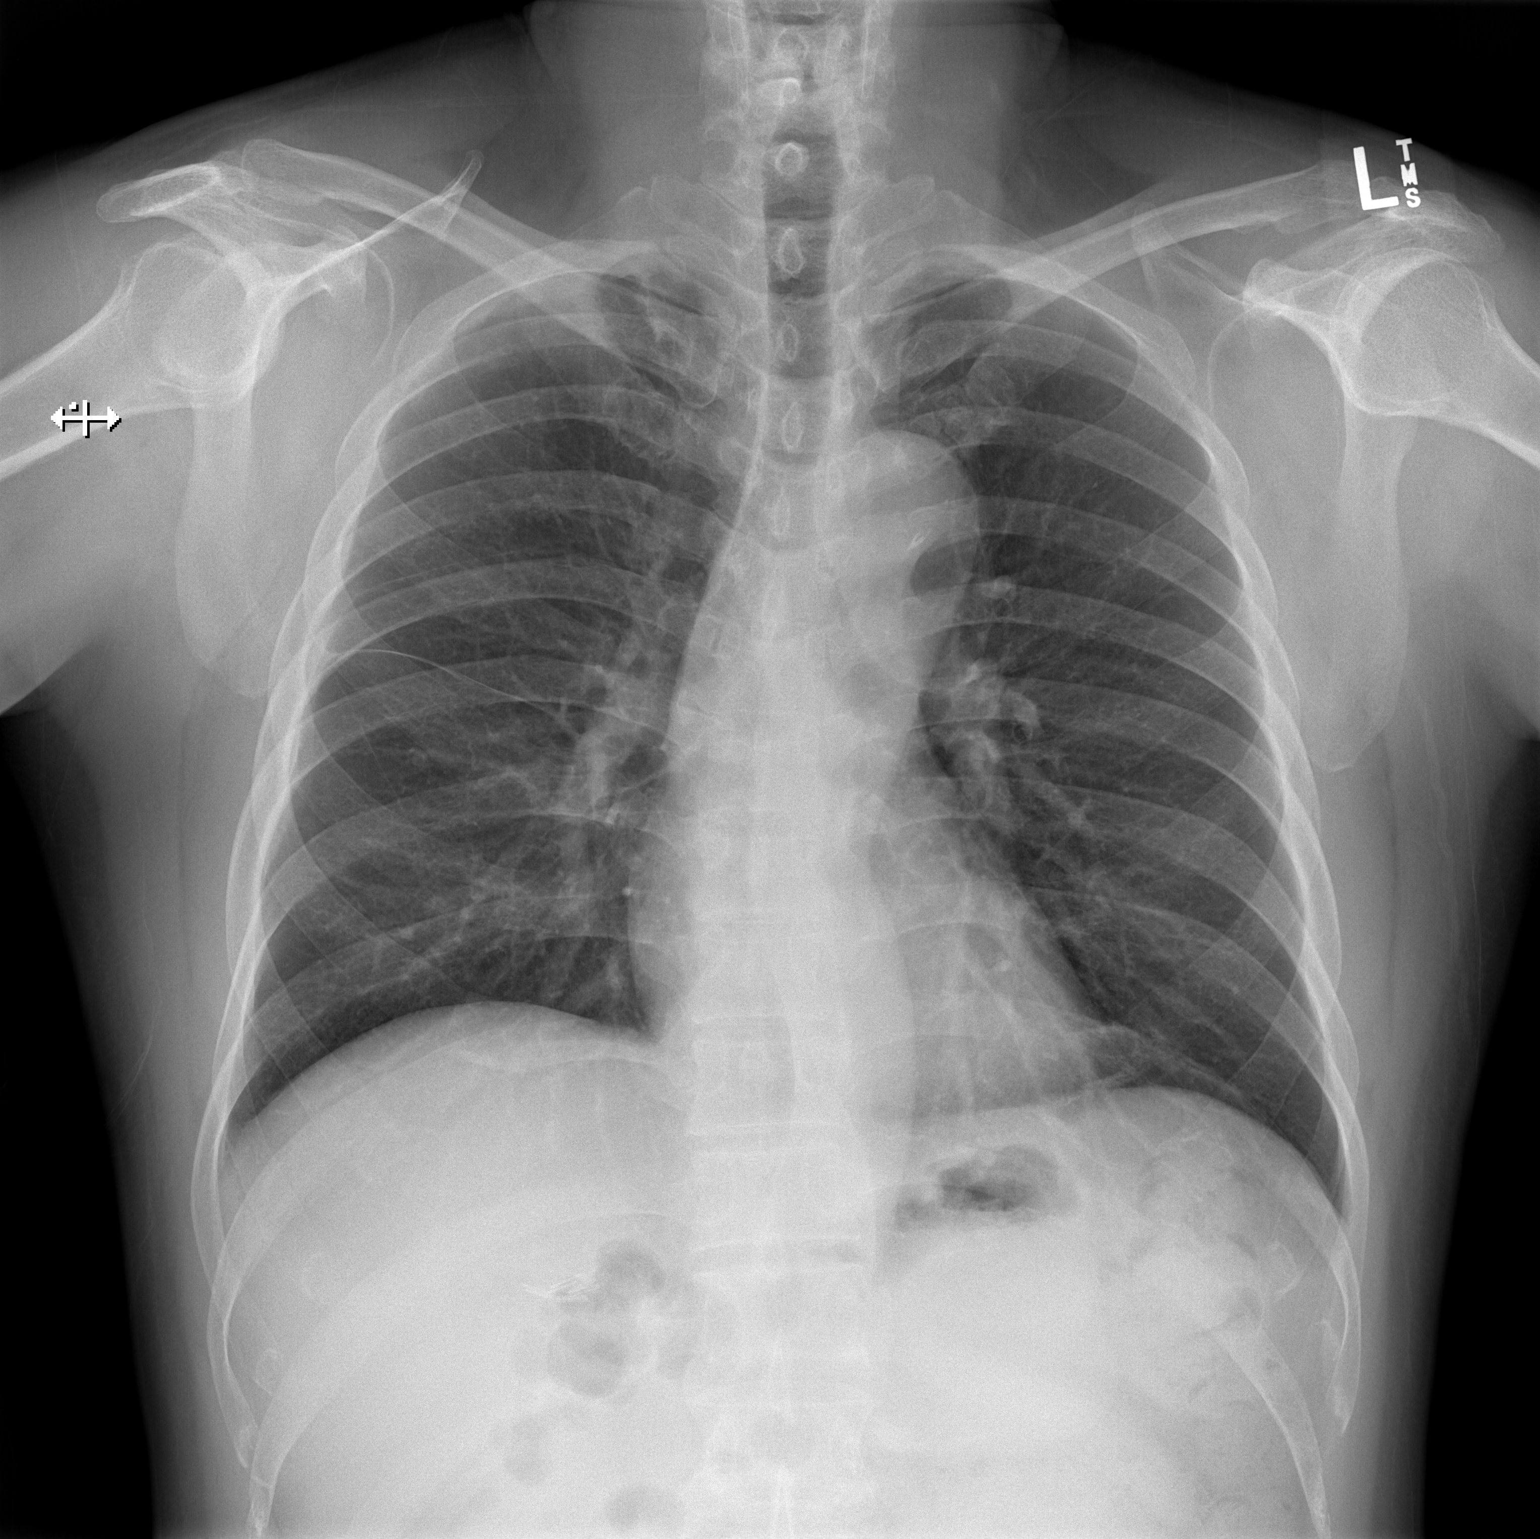

[w chest lat]
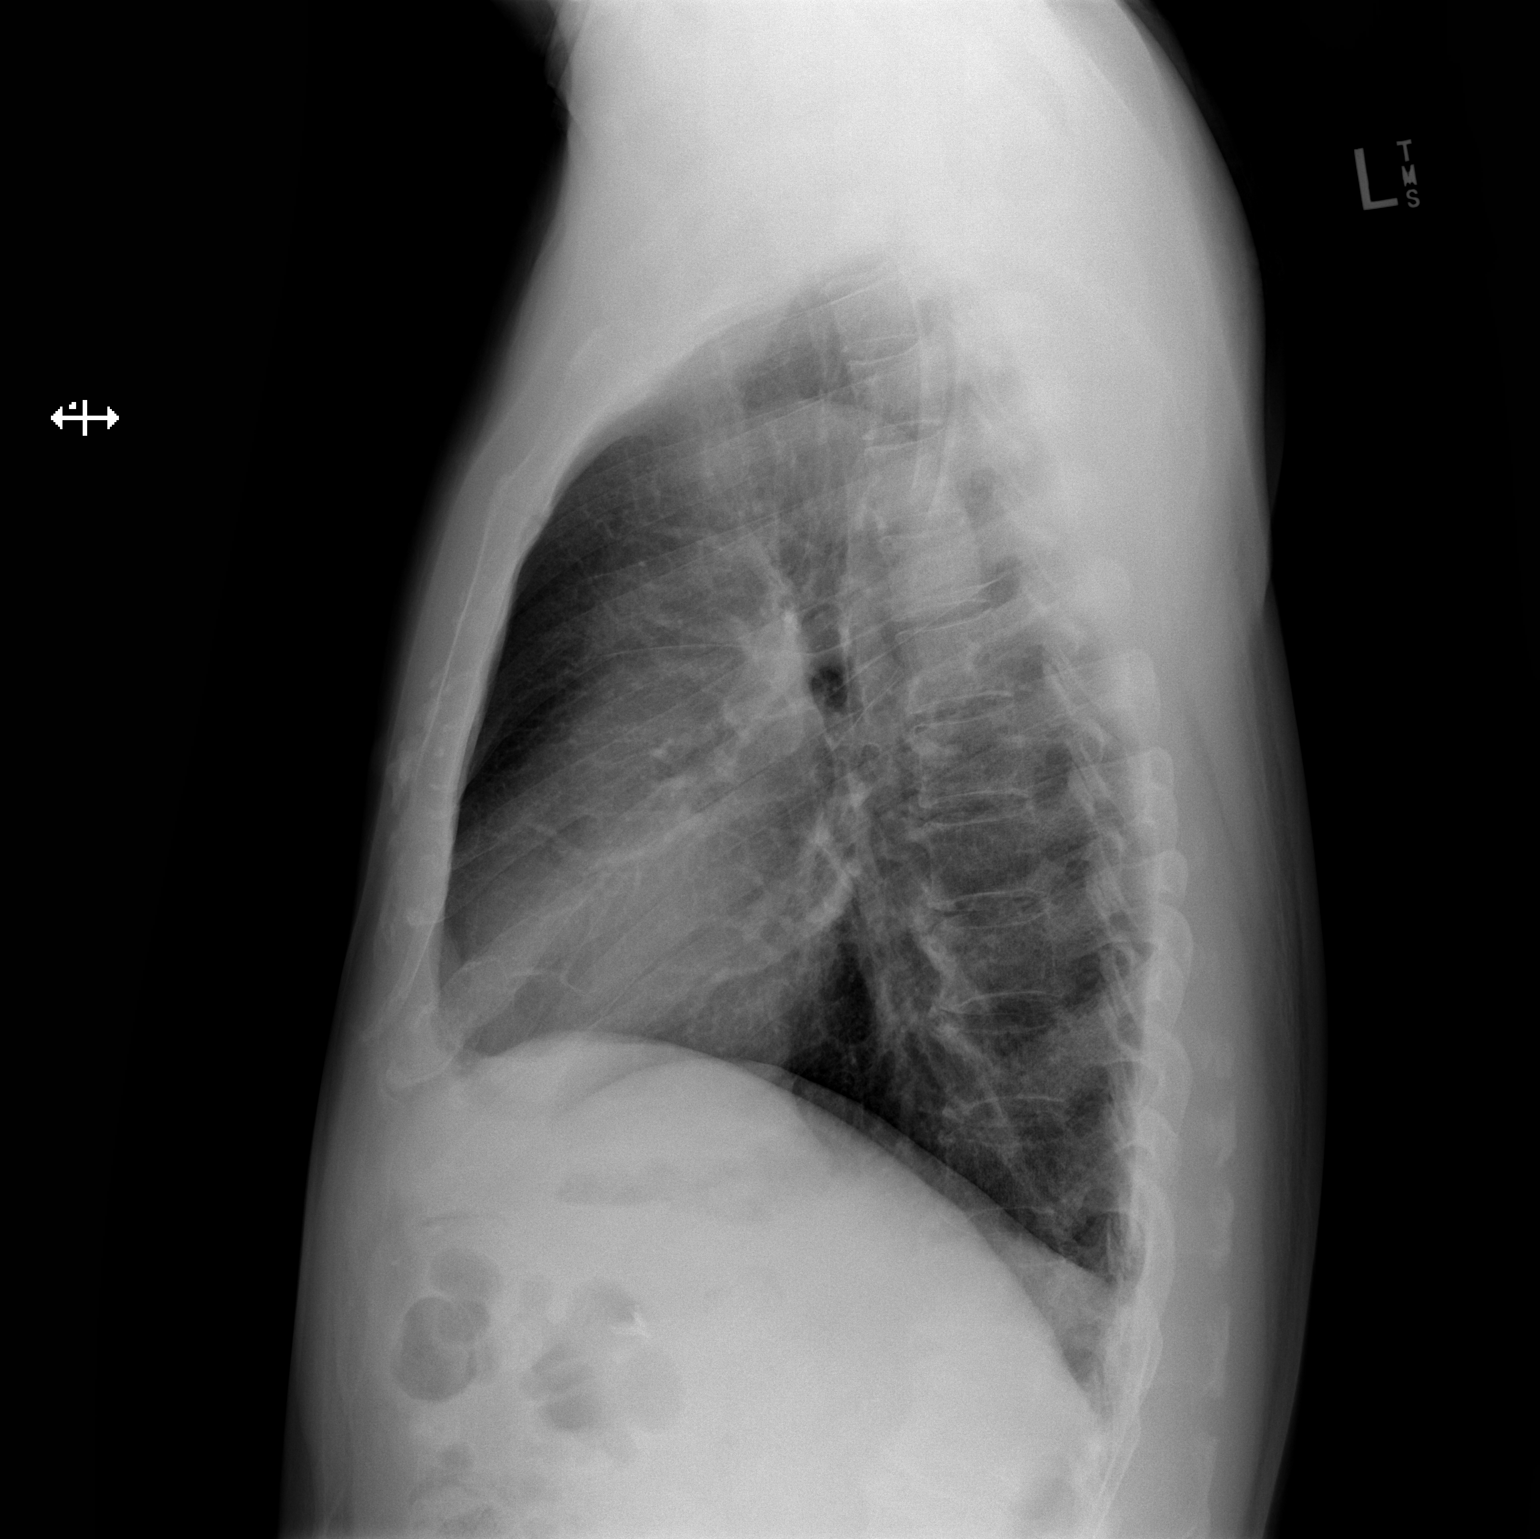

[2 of 2 positions shown; findings below may reference images not displayed]

FINDINGS: The heart size and mediastinal contours are within normal limits.
Mild tortuosity of thoracic aorta is stable. Both lungs are clear.
The visualized skeletal structures are unremarkable.
IMPRESSION: No active cardiopulmonary disease.

## 2021-04-01 IMAGING — DX DG CHEST 1V PORT
1 series · 1 of 1 positions shown · non-contrast
Comparison: January 05, 2021

CLINICAL DATA: Chest pain.  Status postextubation

EXAM:
PORTABLE CHEST 1 VIEW

[chest ap]
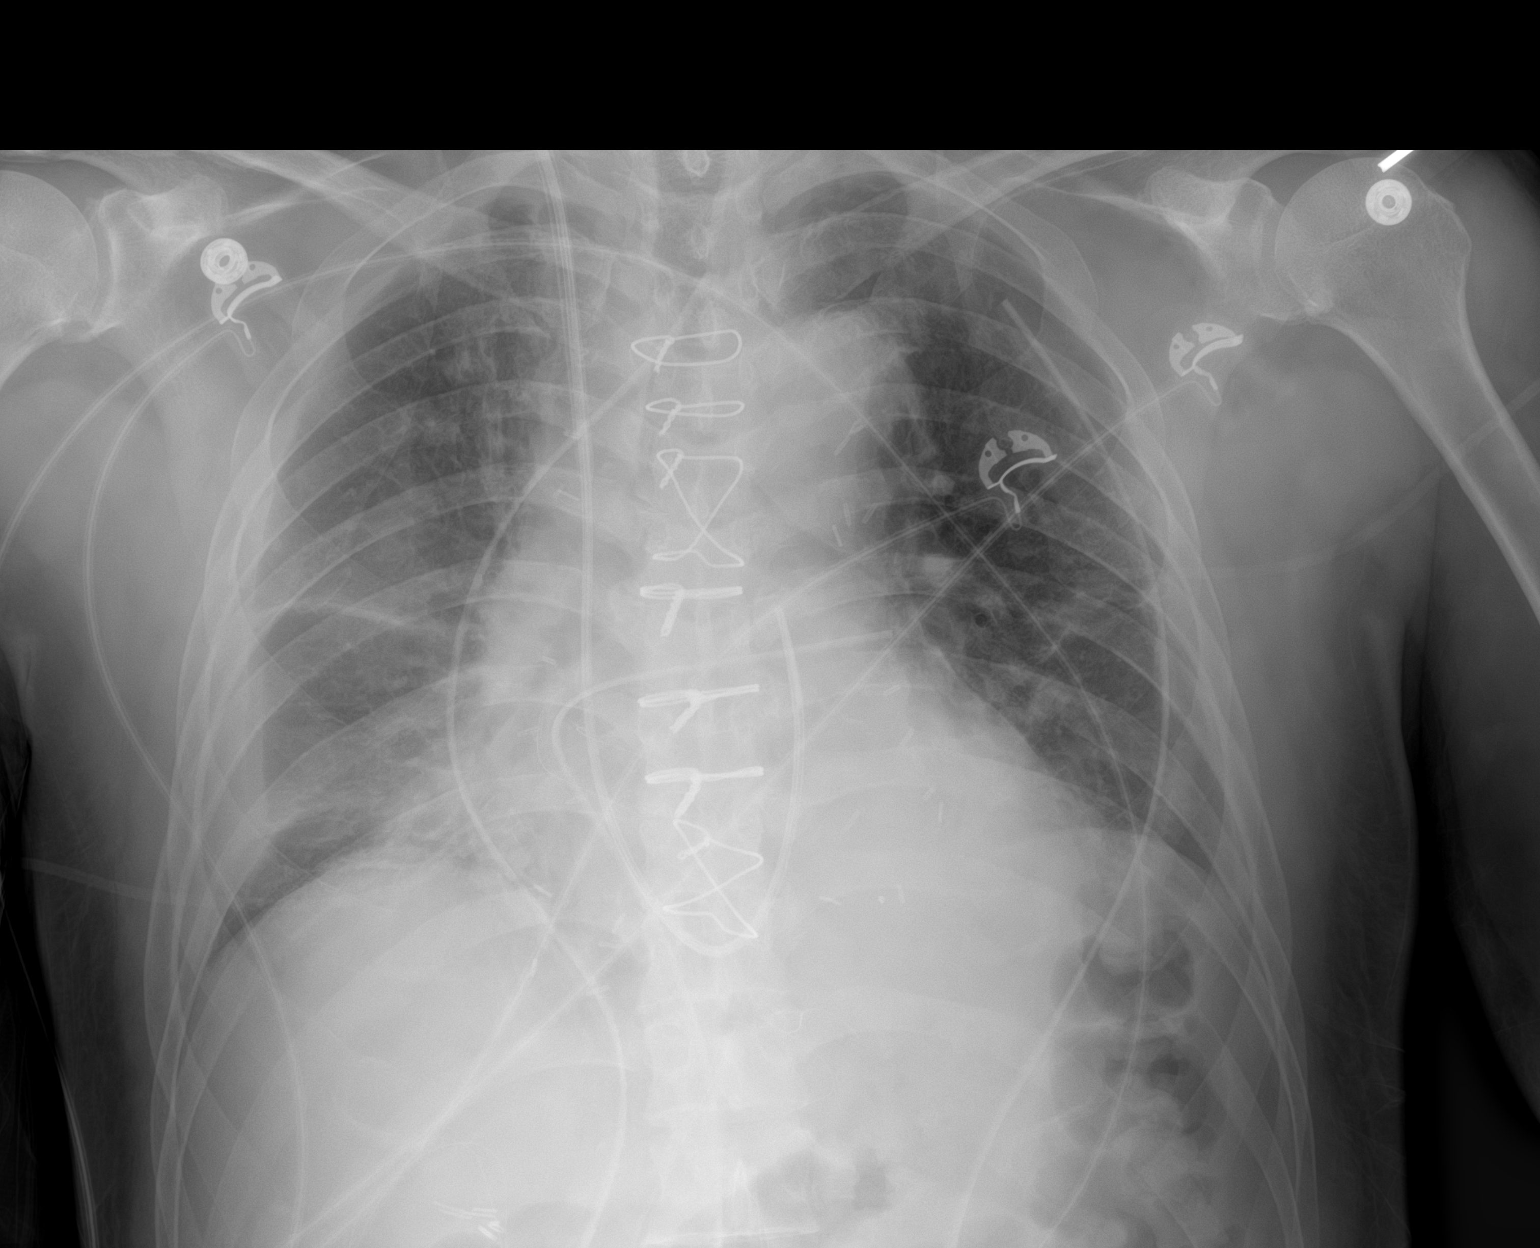

[1 of 1 positions shown; findings below may reference images not displayed]

FINDINGS: Endotracheal tube and nasogastric tube have been removed. Swan-Ganz
catheter tip is in the main pulmonary outflow tract, unchanged. Left
chest tube as well as mediastinal drains are unchanged in position.
Temporary pacemaker wires are attached to the right heart. No
pneumothorax. There is atelectatic change in portions of the right
mid lung and right base regions. There is ill-defined patchy opacity
in the left lower lobe, new from 1 day prior. Ill-defined airspace
opacity in the right upper lobe is stable. There is new ill-defined
airspace opacity in the right base. Heart is mildly enlarged with
pulmonary vascularity normal. No adenopathy. There is aortic
atherosclerosis. No bone lesions.
IMPRESSION: Tube and catheter positions as described without pneumothorax. Areas
of patchy infiltrate in each lower lung region are new compared to 1
day prior and may represent atelectasis or early changes of
developing pneumonia. And ill-defined area of opacity in the right
upper lobe is stable. There is atelectatic change in the right mid
lung and right base regions.

Stable cardiac prominence with postoperative changes. Aortic
Atherosclerosis ([id number]).

## 2021-04-05 IMAGING — DX DG CHEST 2V
2 series · 2 of 2 positions shown · non-contrast
Comparison: 01/09/2021

CLINICAL DATA: Status post chest tube removal

EXAM:
CHEST - 2 VIEW

[chest pa]
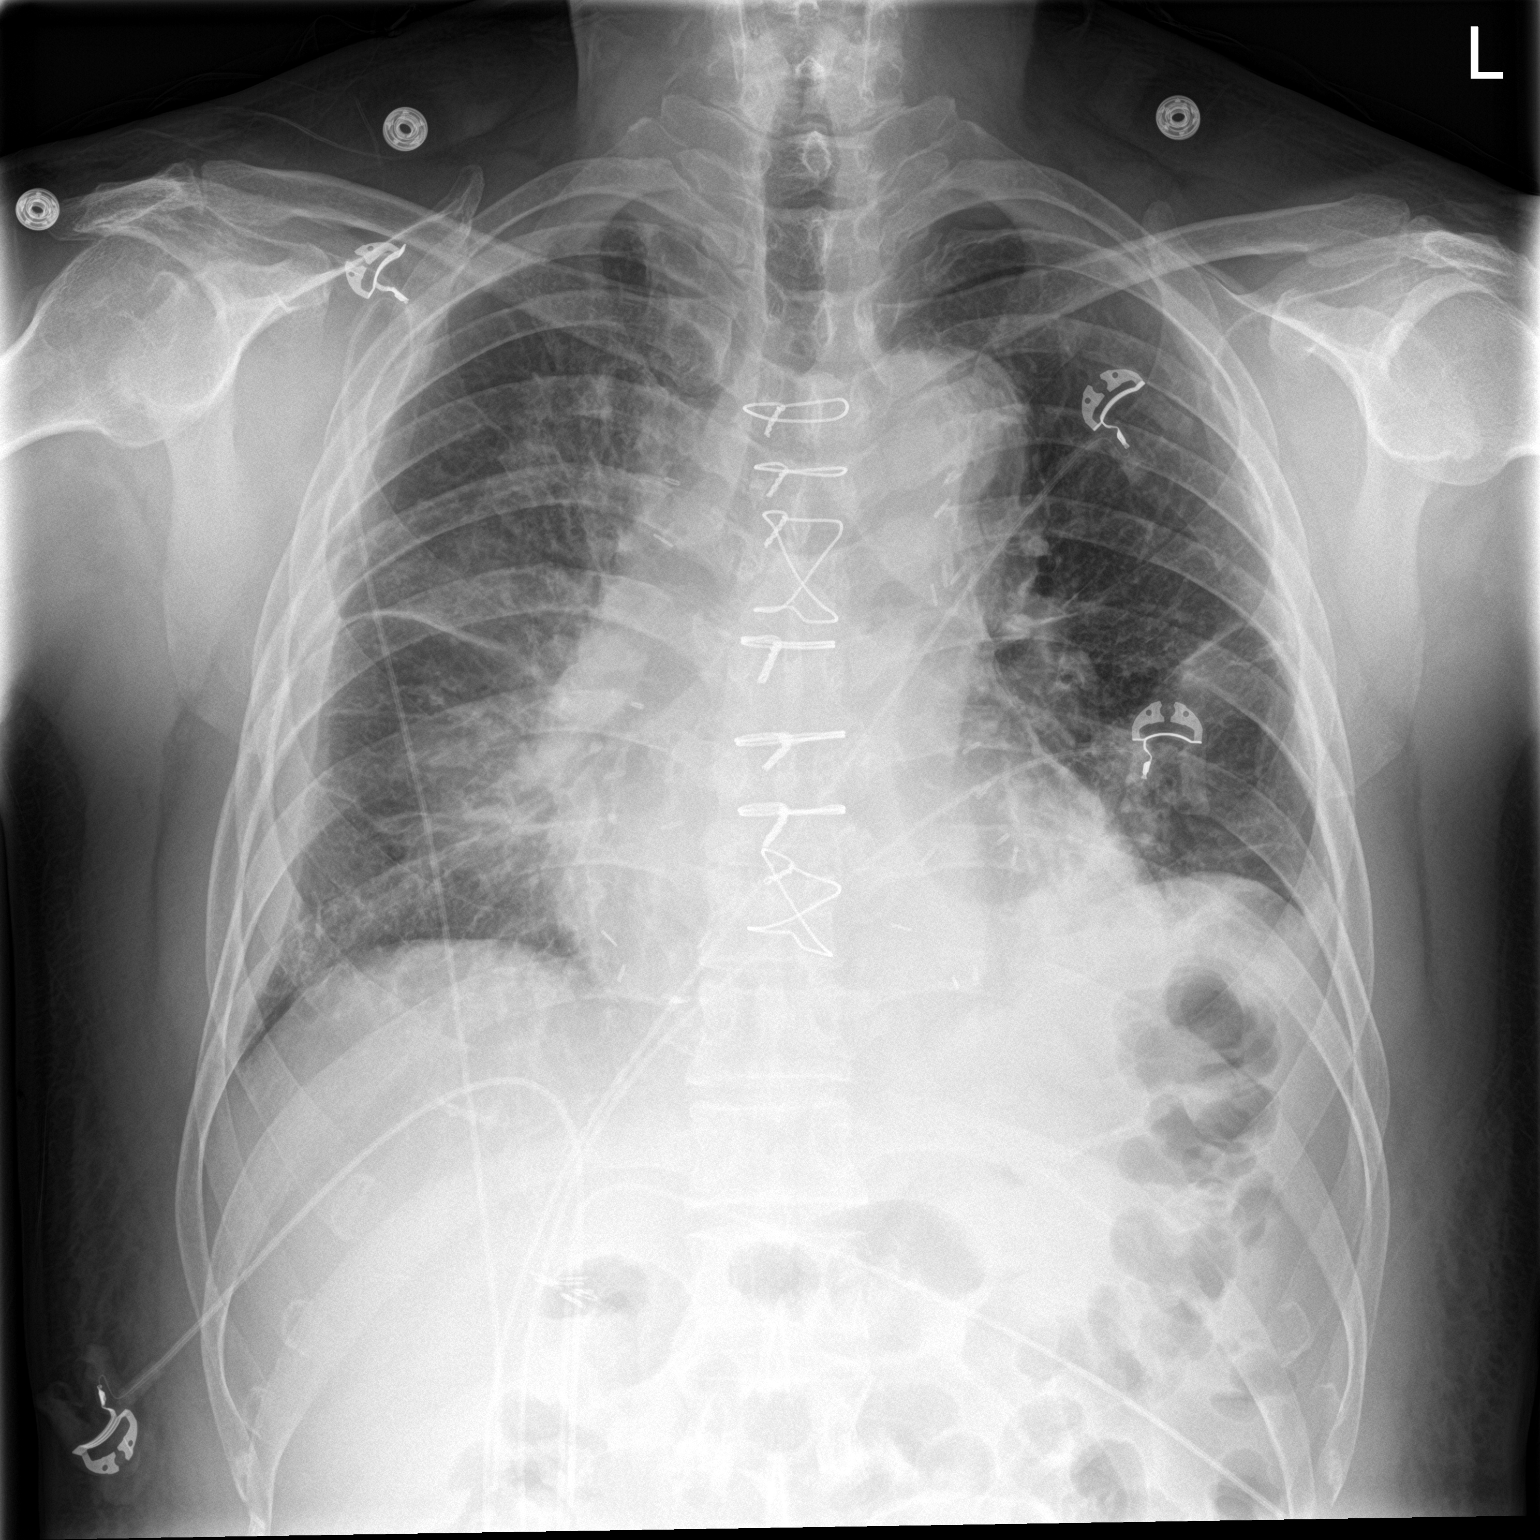

[chest lat]
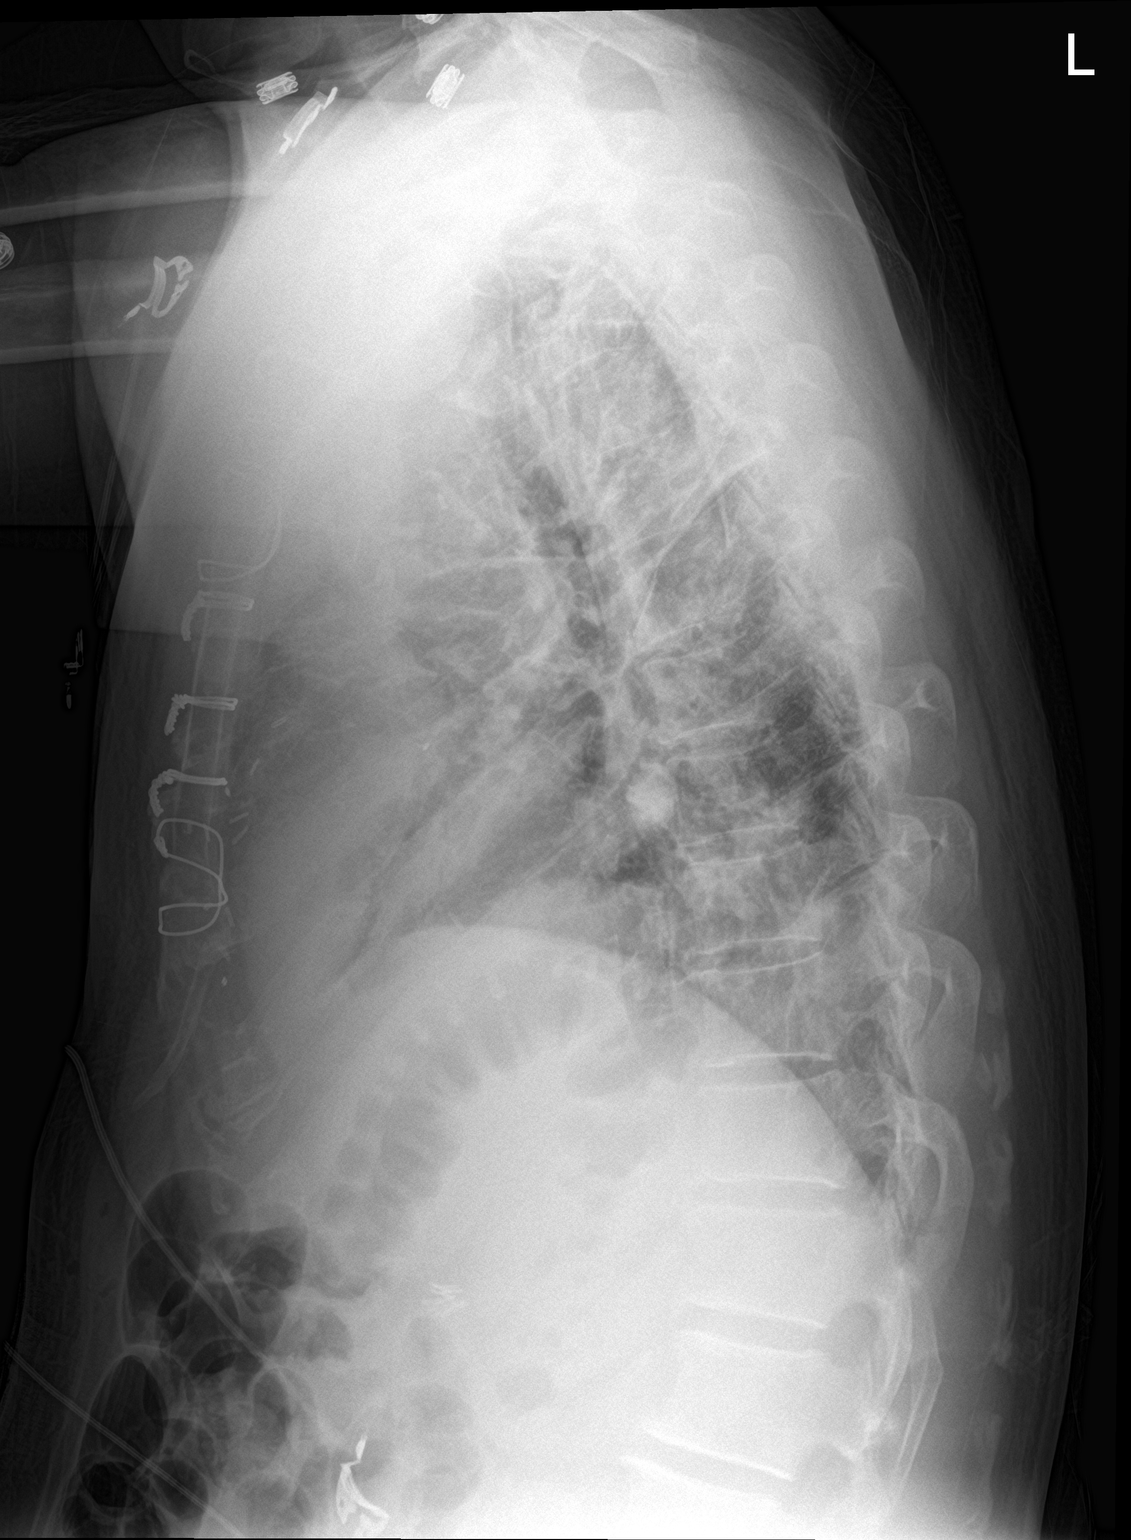

[2 of 2 positions shown; findings below may reference images not displayed]

FINDINGS: Mediastinal drain and bilateral thoracostomy catheters have been
removed in the interval. No pneumothorax is identified. Mild pleural
thickening is noted on the right stable from the prior study. Mild
persistent vascular congestion is noted although somewhat improved
when compared with the prior study. Cardiomegaly and postsurgical
changes are again noted. No new focal abnormality is noted.
IMPRESSION: No pneumothorax following chest tube removal.

Mild vascular congestion although improved from the prior study.

Persistent right pleural thickening

## 2021-04-05 NOTE — Telephone Encounter (Signed)
Called patient and left message again to follow-up on another set of forms we received from [facility] also to inform him about payment and authorization needed to complete his forms. Still waiting for payment and authorization from patient to complete the process. AO 04/05/21

## 2021-04-11 ENCOUNTER — Other Ambulatory Visit: Payer: Federal, State, Local not specified - PPO

## 2021-04-11 ENCOUNTER — Other Ambulatory Visit: Payer: Self-pay | Admitting: Physician Assistant

## 2021-04-11 DIAGNOSIS — I10 Essential (primary) hypertension: Secondary | ICD-10-CM | POA: Diagnosis not present

## 2021-04-11 DIAGNOSIS — I2510 Atherosclerotic heart disease of native coronary artery without angina pectoris: Secondary | ICD-10-CM | POA: Diagnosis not present

## 2021-04-11 DIAGNOSIS — E782 Mixed hyperlipidemia: Secondary | ICD-10-CM | POA: Diagnosis not present

## 2021-04-11 DIAGNOSIS — N179 Acute kidney failure, unspecified: Secondary | ICD-10-CM | POA: Diagnosis not present

## 2021-04-11 LAB — COMPREHENSIVE METABOLIC PANEL
ALT: 24 IU/L (ref 0–44)
AST: 13 IU/L (ref 0–40)
Albumin/Globulin Ratio: 2.2 (ref 1.2–2.2)
Albumin: 4.3 g/dL (ref 3.8–4.9)
Alkaline Phosphatase: 40 IU/L — ABNORMAL LOW (ref 44–121)
BUN/Creatinine Ratio: 22 — ABNORMAL HIGH (ref 9–20)
BUN: 22 mg/dL (ref 6–24)
Bilirubin Total: 0.3 mg/dL (ref 0.0–1.2)
CO2: 23 mmol/L (ref 20–29)
Calcium: 9.5 mg/dL (ref 8.7–10.2)
Chloride: 104 mmol/L (ref 96–106)
Creatinine, Ser: 1.01 mg/dL (ref 0.76–1.27)
Globulin, Total: 2 g/dL (ref 1.5–4.5)
Glucose: 91 mg/dL (ref 65–99)
Potassium: 4.2 mmol/L (ref 3.5–5.2)
Sodium: 142 mmol/L (ref 134–144)
Total Protein: 6.3 g/dL (ref 6.0–8.5)
eGFR: 88 mL/min/{1.73_m2} (ref >59)

## 2021-04-11 LAB — LIPID PANEL W/O CHOL/HDL RATIO
Cholesterol, Total: 286 mg/dL — ABNORMAL HIGH (ref 100–199)
HDL: 101 mg/dL (ref >39)
LDL Chol Calc (NIH): 165 mg/dL — ABNORMAL HIGH (ref 0–99)
Triglycerides: 120 mg/dL (ref 0–149)
VLDL Cholesterol Cal: 20 mg/dL (ref 5–40)

## 2021-04-12 ENCOUNTER — Encounter (HOSPITAL_COMMUNITY): Payer: Self-pay

## 2021-04-13 ENCOUNTER — Other Ambulatory Visit: Payer: Federal, State, Local not specified - PPO

## 2021-04-13 NOTE — Progress Notes (Signed)
Cardiology Office Note:    Date:  04/14/2021   ID:  Rodney Strickland, DOB 08/18/1966, MRN [id number]  PCP:  Gaspar Garbe, MD   [facility] Providers Cardiologist:  Kristeen Miss, MD Cardiology APP:  Kennon Rounds     Referring MD: Gaspar Garbe, MD   Chief Complaint:  Follow-up (CAD)    Patient Profile:    Rodney Strickland is a 55 y.o. male with:   Coronary artery disease  ? S/p CABG in 12/2020 (L-LAD, Radial-OM1/OM2, RIMA-PDA; L radial harvest)  (HFpEF) heart failure with preserved ejection fraction   Hypertension   Hyperlipidemia w/ high triglycerides   Hx of remote bilateral lung injury   Chronic kidney disease   Possible nephrotic syndrome  ? admx 3/21 (post CABG) w AKI, hyperK+, low alb, proteinuria ISO ACEi/Loop diuretic ? Bx 3/22: Minimal Change Disease   Post CABG L pleural effusion s/p thoracentesis (01/2021)  R chest wall hematoma 3/22  Prior CV studies: Echocardiogram 01/17/21 EF 60-65, no RWMA, Gr 2 DD, normal RVSF, ascending aorta 37 mm  Pre-CABG Dopplers 12/29/20 Bilat ICA 1-39  Echocardiogram 12/29/20 EF 55-60, no RWMA, normal RVSF, trivial MR  Cardiac catheterization 12/29/20 Conclusions: 1. Significant three-vessel coronary artery disease, including sequential 40%-60% mid/distal LAD stenosis as well as 90% LAD lesion near the apex, multifocal proximal-mid LCx disease of up to 90% with significant calcification and tortuosity, 90% proximal and 60% mid RCA lesions, and chronic total occlusion of RCA continuation with left-to-right collaterals filling the distal rPL branches. 2. Normal left ventricular systolic function and filling pressure.   Post op course was notable for   History of Present Illness: Rodney Strickland underwent CABG in 12/2020.  His post op course was c/b pericarditis, AKI, L pleural effusion, and significant leg edema in the setting of significant proteinuria and low albumin concerning for nephrotic syndrome.   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 He is followed by Dr. Valentino Nose with Nephrology.  He was placed on steroids and dapsone.  With this his LFTs increased and his rosuvastatin was stopped. He is now on atovaquone and off the dapsone.  He should be able to come off of steroids after several weeks.     He was seen by Dr. Elease Hashimoto last in 4/22.  He had a sternal infection and this was cleaned and a suture was removed.  He was placed on antibiotics and returns for f/u.  He is here with his wife.  Overall, he is doing well without chest discomfort, shortness of breath, syncope, orthopnea or leg edema.  He feels weak at times.  His left hand remains weak.  He has symptoms of brachial plexopathy.  He has not been using a stress ball and was never referred to OT.  He was just referred to cardiac rehab and is due to start there soon.  He has not returned to work.  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  He has minimal lifting.  He thinks he should be able to return to work sometime in early July.    Past Medical History:  Diagnosis Date  . Coronary artery disease   . Gout   . Hypercholesteremia   . Hypertension   . Injury of both lungs 1986   water in lungs    Current Medications: Current Meds  Medication Sig  . acetaminophen (TYLENOL) 325 MG tablet Take 650 mg by mouth every 6 (six) hours as needed for headache.  . allopurinol (ZYLOPRIM)  100 MG tablet TAKE 1 TABLET BY MOUTH ONCE DAILY  . aspirin EC 81 MG tablet Take 81 mg by mouth daily. Swallow whole.  Marland Kitchen atovaquone (MEPRON) 750 MG/5ML suspension Take 1,500 mg by mouth daily.  Marland Kitchen icosapent Ethyl (VASCEPA) 1 g capsule Take 2 capsules (2 g total) by mouth 2 (two) times daily.  Marland Kitchen lisinopril (ZESTRIL) 5 MG tablet Take 5 mg by mouth daily.  Marland Kitchen loratadine (CLARITIN) 10 MG tablet Take 10 mg by mouth daily as needed for allergies.  Marland Kitchen ondansetron (ZOFRAN) 4 MG tablet TAKE 1 TABLET BY MOUTH EVERY 8 HOURS AS NEEDED FOR NAUSEA AND VOMITING  . oxyCODONE (OXY  IR/ROXICODONE) 5 MG immediate release tablet Take 1 tablet (5 mg total) by mouth every 6 (six) hours as needed for severe pain.  . predniSONE (DELTASONE) 20 MG tablet Take 20 mg by mouth daily with breakfast. Taking 3 tablets daily  . rosuvastatin (CRESTOR) 20 MG tablet Take 1 tablet (20 mg total) by mouth daily.  . vitamin C (ASCORBIC ACID) 500 MG tablet Take 500 mg by mouth daily.  Marland Kitchen zinc gluconate 50 MG tablet Take 50 mg by mouth daily.  . [DISCONTINUED] metoprolol tartrate (LOPRESSOR) 25 MG tablet Take 1 tablet (25 mg total) by mouth 2 (two) times daily.     Allergies:   Patient has no known allergies.   Social History   Tobacco Use  . Smoking status: Never Smoker  . Smokeless tobacco: Never Used  Vaping Use  . Vaping Use: Never used  Substance Use Topics  . Alcohol use: No  . Drug use: No     Family Hx: The patient's family history includes Diabetes type II in his mother; Hypertension in his father and sister; Stroke in his father.  ROS - See HPI   EKGs/Labs/Other Test Reviewed:    EKG:  EKG is not ordered today.  The ekg ordered today demonstrates n/a  Recent Labs: 11/26/2020: TSH 3.252 01/06/2021: Magnesium 2.8 01/19/2021: B Natriuretic Peptide 185.2 02/07/2021: Hemoglobin 8.1; Platelets 361 04/11/2021: ALT 24; BUN 22; Creatinine, Ser 1.01; Potassium 4.2; Sodium 142   Recent Lipid Panel Lab Results  Component Value Date/Time   CHOL 286 (H) 04/11/2021 08:27 AM   TRIG 120 04/11/2021 08:27 AM   HDL 101 04/11/2021 08:27 AM   LDLCALC 165 (H) 04/11/2021 08:27 AM      Risk Assessment/Calculations:      Physical Exam:    VS:  BP (!) 82/60   Pulse 69   Ht 5\' 6"  (1.676 m)   Wt 156 lb 3.2 oz (70.9 kg)   SpO2 97%   BMI 25.21 kg/m     Wt Readings from Last 3 Encounters:  04/14/21 156 lb 3.2 oz (70.9 kg)  03/07/21 139 lb (63 kg)  02/16/21 185 lb (83.9 kg)     Constitutional:      Appearance: Healthy appearance. Not in distress.  Neck:     Vascular: JVD  normal.  Pulmonary:     Effort: Pulmonary effort is normal.     Breath sounds: No wheezing. No rales.  Cardiovascular:     Normal rate. Regular rhythm. Normal S1. Normal S2.     Murmurs: There is no murmur.     Comments: Median sternotomy wound is well healed without erythema/discharge Edema:    Peripheral edema absent.  Abdominal:     Palpations: Abdomen is soft. There is no hepatomegaly.  Skin:    General: Skin is warm and dry.  Neurological:  General: No focal deficit present.     Mental Status: Alert and oriented to person, place and time.     Cranial Nerves: Cranial nerves are intact.          ASSESSMENT & PLAN:    1. Coronary artery disease involving native coronary artery of native heart without angina pectoris Status post CABG in 2/22.  He is doing well without anginal symptoms.  He is off of statin therapy due to elevated LFTs in the setting of dapsone therapy.  Continue aspirin, beta-blocker.  I have encouraged him to pursue cardiac rehab.  I think this will help him feel stronger.  I have also recommended he use a stress ball for his left hand.  If he does not have improvement in the weakness there, he should let us know so we can refer him to OT.  I think he should be able to return to work sometime in early July.  As long as his hand is recovering and he is feeling stronger, he will contact me so I can arrange writing a letter to return.  2. Sternal wound infection Resolved.  3. Minimal change disease Continue follow-up with nephrology.  He is on prednisone.  Recent creatinine normal.  4. Chronic heart failure with preserved ejection fraction (HCC) Volume status is stable.  He is not on diuretic therapy.  5. Essential hypertension Blood pressure somewhat low.  I have asked him to decrease his metoprolol to 12.5 mg twice daily.  He should continue to monitor his blood pressures.  6. Mixed hyperlipidemia Recent LDL above goal.  He is off of statin therapy.  I  will reach out to Dr. Valentino Nose to see if we can resume statin therapy.  If it becomes problematic to use statins given his other medications or he has recurrent increase in LFTs with statin therapy, we will need to consider PCSK9 inhibitor therapy.   Dispo:  Return in about 6 months (around 10/14/2021) for Routine Follow Up, w/ Dr. Elease Hashimoto, or Tereso Newcomer, PA-C.   Medication Adjustments/Labs and Tests Ordered: Current medicines are reviewed at length with the patient today.  Concerns regarding medicines are outlined above.  Tests Ordered: No orders of the defined types were placed in this encounter.  Medication Changes: Meds ordered this encounter  Medications  . metoprolol tartrate (LOPRESSOR) 25 MG tablet    Sig: Take 0.5 tablets (12.5 mg total) by mouth 2 (two) times daily.    Dispense:  180 tablet    Refill:  3    Signed, Tereso Newcomer, PA-C  04/14/2021 1:00 PM    [facility] [address] [place], [place], [place]  [address] Phone: [phone]; Fax: [phone]

## 2021-04-14 ENCOUNTER — Telehealth: Payer: Self-pay | Admitting: *Deleted

## 2021-04-14 ENCOUNTER — Other Ambulatory Visit: Payer: Federal, State, Local not specified - PPO

## 2021-04-14 ENCOUNTER — Ambulatory Visit: Payer: Federal, State, Local not specified - PPO | Admitting: Physician Assistant

## 2021-04-14 ENCOUNTER — Other Ambulatory Visit: Payer: Self-pay

## 2021-04-14 ENCOUNTER — Encounter: Payer: Self-pay | Admitting: Physician Assistant

## 2021-04-14 VITALS — BP 82/60 | HR 69 | Ht 66.0 in | Wt 156.2 lb

## 2021-04-14 DIAGNOSIS — N050 Unspecified nephritic syndrome with minor glomerular abnormality: Secondary | ICD-10-CM

## 2021-04-14 DIAGNOSIS — S21101D Unspecified open wound of right front wall of thorax without penetration into thoracic cavity, subsequent encounter: Secondary | ICD-10-CM | POA: Diagnosis not present

## 2021-04-14 DIAGNOSIS — I5032 Chronic diastolic (congestive) heart failure: Secondary | ICD-10-CM

## 2021-04-14 DIAGNOSIS — I10 Essential (primary) hypertension: Secondary | ICD-10-CM

## 2021-04-14 DIAGNOSIS — E782 Mixed hyperlipidemia: Secondary | ICD-10-CM

## 2021-04-14 DIAGNOSIS — I2510 Atherosclerotic heart disease of native coronary artery without angina pectoris: Secondary | ICD-10-CM

## 2021-04-14 DIAGNOSIS — L089 Local infection of the skin and subcutaneous tissue, unspecified: Secondary | ICD-10-CM

## 2021-04-14 DIAGNOSIS — N1831 Chronic kidney disease, stage 3a: Secondary | ICD-10-CM

## 2021-04-14 MED ORDER — METOPROLOL TARTRATE 25 MG PO TABS: 12.5000 mg | ORAL_TABLET | Freq: Two times a day (BID) | ORAL | 3 refills | Status: DC

## 2021-04-14 NOTE — Telephone Encounter (Signed)
Called [facility] and s/w Marchelle Folks for Dr. Valentino Nose.  Pt has appt in nephro office next week and at [facility], [place] would like to be brought up for discussion if pt can restart rosuvastatin. Marchelle Folks stated this will be taken care of.

## 2021-04-14 NOTE — Telephone Encounter (Signed)
Pt called back and is interested in the cardiac rehab, will come in for orientation on 05/09/2021@2 :15pm and will do the 10:45am exercise time.  Mailed packet

## 2021-04-14 NOTE — Patient Instructions (Addendum)
Medication Instructions:  Your physician has recommended you make the following change in your medication:   1.  Decrease Metoprolol one tablet by mouth ( 12.5 mg) twice daily.  *If you need a refill on your cardiac medications before your next appointment, please call your pharmacy*   Lab Work: -None  If you have labs (blood work) drawn today and your tests are completely normal, you will receive your results only by: Marland Kitchen MyChart Message (if you have MyChart) OR . A paper copy in the mail If you have any lab test that is abnormal or we need to change your treatment, we will call you to review the results.   Testing/Procedures: -None   Follow-Up: At [facility], you and your health needs are our priority.  As part of our continuing mission to provide you with exceptional heart care, we have created designated Provider Care Teams.  These Care Teams include your primary Cardiologist (physician) and Advanced Practice Providers (APPs -  Physician Assistants and Nurse Practitioners) who all work together to provide you with the care you need, when you need it.  We recommend signing up for the patient portal called "MyChart".  Sign up information is provided on this After Visit Summary.  MyChart is used to connect with patients for Virtual Visits (Telemedicine).  Patients are able to view lab/test results, encounter notes, upcoming appointments, etc.  Non-urgent messages can be sent to your provider as well.   To learn more about what you can do with MyChart, go to [email or web address].    Your next appointment:   6 month(s)  The format for your next appointment:   In Person  Provider:   You may see Kristeen Miss, MD or one of the following Advanced Practice Providers on your designated Care Team:    Tereso Newcomer, PA-C   Other Instructions Your physician wants you to follow-up in: 6 month with Dr. Elease Hashimoto or Tereso Newcomer, [place]. You will receive a reminder letter in the mail two  months in advance. If you don't receive a letter, please call our office to schedule the follow-up appointment.  Please ask Dr. Valentino Nose if you can restart rosuvastatin.    Work on a stress ball in your L hand for next few weeks.  If the weakness is not improving after 2-3 weeks, call us and we will refer you to occupational therapy.    You should be able to return to work in early July.  If you feel you are on track to do this, call so we can arrange a letter for you.

## 2021-04-18 DIAGNOSIS — I129 Hypertensive chronic kidney disease with stage 1 through stage 4 chronic kidney disease, or unspecified chronic kidney disease: Secondary | ICD-10-CM | POA: Diagnosis not present

## 2021-04-18 DIAGNOSIS — E872 Acidosis: Secondary | ICD-10-CM | POA: Diagnosis not present

## 2021-04-18 DIAGNOSIS — N050 Unspecified nephritic syndrome with minor glomerular abnormality: Secondary | ICD-10-CM | POA: Diagnosis not present

## 2021-04-18 DIAGNOSIS — N1831 Chronic kidney disease, stage 3a: Secondary | ICD-10-CM | POA: Diagnosis not present

## 2021-04-19 DIAGNOSIS — Z0279 Encounter for issue of other medical certificate: Secondary | ICD-10-CM

## 2021-04-19 NOTE — Telephone Encounter (Signed)
Patient came in to make payment & sign authorization. The form has been placed in Dr. Harvie Bridge box to be completed. AO 04/19/21

## 2021-04-19 NOTE — Telephone Encounter (Signed)
Called and spoke to patient to inform him of the Disability form process, payment, and authorization needed to complete the form. AO 04/19/21

## 2021-04-24 NOTE — Telephone Encounter (Signed)
HIM received form back from doctor. Form is complete and has been faxed via Epic to [employer] as requested. AO 04/24/21

## 2021-04-28 ENCOUNTER — Telehealth (HOSPITAL_COMMUNITY): Payer: Self-pay

## 2021-05-05 ENCOUNTER — Encounter: Payer: Self-pay | Admitting: Internal Medicine

## 2021-05-05 NOTE — Progress Notes (Signed)
55 year old with recent CABG also recently diagnosed with minimal change disease.  Experienced acute rise in AST/ALT in April shortly after starting dapsone. He was also on crestor. We held dapsone and crestor and his LFTs improved. His bilirubin remained near normal the whole time. Because this occurred shortly after starting dapsone we felt this was the most likely culprit. He was put back on crestor in early June with plans to  recheck a cmp 4 weeks later.  He called today reporting Jaundice and abdominal swelling. I recommended he go to the ER for evaluation.

## 2021-05-08 ENCOUNTER — Telehealth (HOSPITAL_COMMUNITY): Payer: Self-pay

## 2021-05-08 DIAGNOSIS — N179 Acute kidney failure, unspecified: Secondary | ICD-10-CM | POA: Diagnosis not present

## 2021-05-08 NOTE — Telephone Encounter (Signed)
Pt wife called and stated that pt wouldn't be able to come in for his cardiac rehab sessions at 10:45 do to work. I advised pt wife that we could give him a call to schedule when the August schedule is complete and hopefully then we would have an opening for 3 or 3:15pm pt wife understood and stated that was fine. Canceled pt cardiac rehab orientation and sessions.

## 2021-05-09 ENCOUNTER — Ambulatory Visit (HOSPITAL_COMMUNITY): Payer: Federal, State, Local not specified - PPO

## 2021-05-11 NOTE — Telephone Encounter (Signed)
Spoke with the patient's wife in regards to [employer].  She reports that his left arm numbness and strength has improved. He does not feel that he needs an OT consult at this time. She states that it is still sensitive but he is able to hold more things now.  Additionally the patient would like to return to work on 07/11 and needs a letter in order to return. She states that the letter will need to state whether he has any restrictions in regards to lifting.  The patient's wife also reports that on 6/7 he restarted rosuvastatin. She noticed that his color started to change to yellowish and urine was dark. He additionally had cramps in his lower extremities. He contacted Dr. Duke Salvia office and had some lab work done that showed decline in renal function. He has discontinued rosuvastatin. Labs are going to be rechecked in one week by Dr. Melanee Spry.  The wife is also concerned in regards to his blood pressure. She states that in the morning it is within normal range but in the evenings it is elevated. She is going to send a MyChart with a list of his readings.

## 2021-05-12 ENCOUNTER — Encounter: Payer: Self-pay | Admitting: Physician Assistant

## 2021-05-12 NOTE — Telephone Encounter (Signed)
I'm glad his arm has improved.  Return to work letter is in his chart and was sent via MyChart.  Remain off of Rosuvastatin and f/u with Dr. Valentino Nose as planned.  Refer to Lipid Clinic in 4-6 weeks to see if we can arrange alternative Rx that is safe for him.  Send BP readings for review so that we can decide if adjustment in meds is needed.  Tereso Newcomer, PA-C    05/12/2021 8:47 AM

## 2021-05-14 ENCOUNTER — Encounter: Payer: Self-pay | Admitting: Physician Assistant

## 2021-05-16 ENCOUNTER — Telehealth: Payer: Self-pay | Admitting: Cardiovascular Disease

## 2021-05-16 NOTE — Telephone Encounter (Signed)
Ms. Su Hilt from [facility] assisting with the patients disability claim, calling to see if the patient has been released to go back to work. She states if he has they also need to know if there are any restrictions. Phone: [phone]

## 2021-05-16 NOTE — Telephone Encounter (Signed)
Letter is in his chart (dated 05/14/21).  Please fax to his insurance. Tereso Newcomer, PA-C    05/16/2021 5:21 PM

## 2021-05-17 ENCOUNTER — Ambulatory Visit (HOSPITAL_COMMUNITY): Payer: Federal, State, Local not specified - PPO

## 2021-05-17 NOTE — Telephone Encounter (Signed)
S/w Ms. Su Hilt to fax pt's revised letter to place of work with changed restrictions. @ [phone].

## 2021-05-18 DIAGNOSIS — T50905A Adverse effect of unspecified drugs, medicaments and biological substances, initial encounter: Secondary | ICD-10-CM | POA: Diagnosis not present

## 2021-05-18 DIAGNOSIS — I129 Hypertensive chronic kidney disease with stage 1 through stage 4 chronic kidney disease, or unspecified chronic kidney disease: Secondary | ICD-10-CM | POA: Diagnosis not present

## 2021-05-18 DIAGNOSIS — N050 Unspecified nephritic syndrome with minor glomerular abnormality: Secondary | ICD-10-CM | POA: Diagnosis not present

## 2021-05-18 DIAGNOSIS — N1831 Chronic kidney disease, stage 3a: Secondary | ICD-10-CM | POA: Diagnosis not present

## 2021-05-18 DIAGNOSIS — K716 Toxic liver disease with hepatitis, not elsewhere classified: Secondary | ICD-10-CM | POA: Diagnosis not present

## 2021-05-19 ENCOUNTER — Ambulatory Visit (HOSPITAL_COMMUNITY): Payer: Federal, State, Local not specified - PPO

## 2021-05-22 ENCOUNTER — Ambulatory Visit (HOSPITAL_COMMUNITY): Payer: Federal, State, Local not specified - PPO

## 2021-05-24 ENCOUNTER — Ambulatory Visit (HOSPITAL_COMMUNITY): Payer: Federal, State, Local not specified - PPO

## 2021-05-25 ENCOUNTER — Telehealth: Payer: Self-pay | Admitting: *Deleted

## 2021-05-25 DIAGNOSIS — E782 Mixed hyperlipidemia: Secondary | ICD-10-CM

## 2021-05-25 NOTE — Telephone Encounter (Signed)
Per staff message from Tereso Newcomer, PA-C, referral for Lipid Clinic has been placed.

## 2021-05-25 NOTE — Telephone Encounter (Signed)
-----   Message from Beatrice Lecher, [place] sent at 05/24/2021  5:25 PM EDT ----- Regarding: refer to lipid clinic Received records from Nephrology. His LFTs were very high and Crestor was DC'd. Please refer to Pharm[facility] Lipid clinic in 4-6 weeks to see if he can get approved for PCSK9 inhibitor for cholesterol. He cannot take a statin again. Tereso Newcomer, PA-C    05/24/2021 5:26 PM

## 2021-05-26 ENCOUNTER — Ambulatory Visit (HOSPITAL_COMMUNITY): Payer: Federal, State, Local not specified - PPO

## 2021-05-29 ENCOUNTER — Ambulatory Visit (HOSPITAL_COMMUNITY): Payer: Federal, State, Local not specified - PPO

## 2021-05-31 ENCOUNTER — Ambulatory Visit (HOSPITAL_COMMUNITY): Payer: Federal, State, Local not specified - PPO

## 2021-06-01 ENCOUNTER — Telehealth: Payer: Self-pay

## 2021-06-01 NOTE — Telephone Encounter (Signed)
LMOM TO R/S TO A DIFFERENT DAY

## 2021-06-02 ENCOUNTER — Ambulatory Visit (HOSPITAL_COMMUNITY): Payer: Federal, State, Local not specified - PPO

## 2021-06-05 ENCOUNTER — Ambulatory Visit (HOSPITAL_COMMUNITY): Payer: Federal, State, Local not specified - PPO

## 2021-06-07 ENCOUNTER — Ambulatory Visit (HOSPITAL_COMMUNITY): Payer: Federal, State, Local not specified - PPO

## 2021-06-07 DIAGNOSIS — I1310 Hypertensive heart and chronic kidney disease without heart failure, with stage 1 through stage 4 chronic kidney disease, or unspecified chronic kidney disease: Secondary | ICD-10-CM | POA: Diagnosis not present

## 2021-06-07 DIAGNOSIS — D649 Anemia, unspecified: Secondary | ICD-10-CM | POA: Diagnosis not present

## 2021-06-07 DIAGNOSIS — N1831 Chronic kidney disease, stage 3a: Secondary | ICD-10-CM | POA: Diagnosis not present

## 2021-06-07 DIAGNOSIS — E782 Mixed hyperlipidemia: Secondary | ICD-10-CM | POA: Diagnosis not present

## 2021-06-09 ENCOUNTER — Ambulatory Visit (HOSPITAL_COMMUNITY): Payer: Federal, State, Local not specified - PPO

## 2021-06-12 ENCOUNTER — Ambulatory Visit (HOSPITAL_COMMUNITY): Payer: Federal, State, Local not specified - PPO

## 2021-06-14 ENCOUNTER — Ambulatory Visit: Payer: Federal, State, Local not specified - PPO

## 2021-06-14 ENCOUNTER — Ambulatory Visit (HOSPITAL_COMMUNITY): Payer: Federal, State, Local not specified - PPO

## 2021-06-15 ENCOUNTER — Other Ambulatory Visit: Payer: Self-pay

## 2021-06-15 ENCOUNTER — Ambulatory Visit (INDEPENDENT_AMBULATORY_CARE_PROVIDER_SITE_OTHER): Payer: Federal, State, Local not specified - PPO | Admitting: Pharmacist

## 2021-06-15 DIAGNOSIS — E785 Hyperlipidemia, unspecified: Secondary | ICD-10-CM | POA: Diagnosis not present

## 2021-06-15 DIAGNOSIS — N049 Nephrotic syndrome with unspecified morphologic changes: Secondary | ICD-10-CM | POA: Diagnosis not present

## 2021-06-15 NOTE — Progress Notes (Signed)
Patient returns for outpatient evaluation status post CABG which has been complicated by nephrotic syndrome.  He underwent renal biopsy approximately 1 week ago.  He is slowly recovering with the assistance of his wife who is a Engineer, civil (consulting). He had also undergone left thoracentesis for recurrent left effusion likely related to his nephrotic syndrome  Active Ambulatory Problems    Diagnosis Date Noted   Right shoulder pain 11/21/2016   Unstable angina (HCC) 12/08/2020   Abnormal cardiac CT angiography 12/29/2020   S/P CABG x 4 01/05/2021   Failure to thrive in adult 01/17/2021   AKI (acute kidney injury) (HCC) 01/17/2021   Hyperkalemia 01/17/2021   CAD (coronary artery disease) 03/07/2021   Sternal wound infection 03/07/2021   Resolved Ambulatory Problems    Diagnosis Date Noted   No Resolved Ambulatory Problems   Past Medical History:  Diagnosis Date   Coronary artery disease    Gout    Hypercholesteremia    Hypertension    Injury of both lungs 1986   Current Outpatient Medications on File Prior to Visit  Medication Sig Dispense Refill   acetaminophen (TYLENOL) 325 MG tablet Take 650 mg by mouth every 6 (six) hours as needed for headache.     allopurinol (ZYLOPRIM) 100 MG tablet TAKE 1 TABLET BY MOUTH ONCE DAILY 30 tablet 3   aspirin EC 81 MG tablet Take 81 mg by mouth daily. Swallow whole.     icosapent Ethyl (VASCEPA) 1 g capsule Take 2 capsules (2 g total) by mouth 2 (two) times daily. 120 capsule 0   loratadine (CLARITIN) 10 MG tablet Take 10 mg by mouth daily as needed for allergies.     ondansetron (ZOFRAN) 4 MG tablet TAKE 1 TABLET BY MOUTH EVERY 8 HOURS AS NEEDED FOR NAUSEA AND VOMITING 20 tablet 0   oxyCODONE (OXY IR/ROXICODONE) 5 MG immediate release tablet Take 1 tablet (5 mg total) by mouth every 6 (six) hours as needed for severe pain. 20 tablet 0   predniSONE (DELTASONE) 20 MG tablet Take 20 mg by mouth daily with breakfast. Taking 3 tablets daily     rosuvastatin (CRESTOR)  20 MG tablet Take 1 tablet (20 mg total) by mouth daily. 30 tablet 1   vitamin C (ASCORBIC ACID) 500 MG tablet Take 500 mg by mouth daily.     zinc gluconate 50 MG tablet Take 50 mg by mouth daily.     [DISCONTINUED] hydrochlorothiazide (HYDRODIURIL) 25 MG tablet Take 1 tablet (25 mg total) by mouth daily. 90 tablet 3   No current facility-administered medications on file prior to visit.    Physical exam: BP 108/74   Pulse 72   Resp 20   Ht 5\' 6"  (1.676 m)   Wt 83.9 kg   SpO2 96% Comment: RA  BMI 29.86 kg/m  Ill-appearing no acute distress Mild erythema about the sternal incision Diminished breath sounds at left base Regular rate and rhythm Mild peripheral edema  Imaging: Chest x-ray from today demonstrates small left effusion which is residual  Impression/plan: Follow-up with nephrology regarding renal biopsy Preserved ejection fraction heart failure symptoms likely complicated by nephrotic syndrome; management of medications according to nephrology and cardiology Follow-up with thoracic surgery as needed Tykerria Mccubbins Z. , MD [phone]

## 2021-06-15 NOTE — Patient Instructions (Addendum)
I will submit a prior authorization for Repatha  I will call you once we get a response from insurance  Please call me at [phone] with any questions

## 2021-06-15 NOTE — Progress Notes (Signed)
Patient ID: Rodney Strickland                 DOB: [birth year]                    MRN: [id number]     HPI: Rodney Strickland is a 55 y.o. male patient of Dr. Harvie Bridge referred to lipid clinic by Tereso Newcomer, PA. PMH is significant for CAD s/p CABG 2/22, HFpEF, HTN, HLD and CKD.  He underwent CABG in 12/2020.  His post op course was c/b pericarditis, AKI, L pleural effusion, and significant leg edema in the setting of significant proteinuria and low albumin concerning for nephrotic syndrome.  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 He is followed by Dr. Valentino Nose with Nephrology.  He was placed on steroids and dapsone.  With this his LFTs increased and his rosuvastatin was stopped. He is now on atovaquone and off the dapsone.  He should be able to come off of steroids after several weeks.  LFT elevation was felt to be due to Dapsone and rosuvastatin was resumed in early June. However his LFT elevated again (AST 244, ALT 492) and rosuvastatin was stopped. LFT normalized as of 06/07/21 (AST 20, ALT 42)  Patient presents today accompanied by his wife. His wife has many very good questions about medications. They have been working on his diet. They are a little overwhelmed by all that has happened since his surgery. States he was very healthy up until then. They it seems like every organ is falling apart.   Current Medications: Vascepa 2g BID Intolerances: rosuvastatin (elevated LFT) Risk Factors: CAD, CKD, HTN LDL goal: <55  Diet: trying to limit sweets/carbs  Exercise: has been walking on treadmill some. Cannot do cardiac rehab bc by the time he can go, he will be back at work.  Family History: The patient's family history includes Diabetes type II in his mother; Hypertension in his father and sister; Stroke in his father.  Social History:  Social History   Socioeconomic History   Marital status: Married    Spouse name: Not on file   Number of children: 3   Years of education: Not on  file   Highest education level: Not on file  Occupational History   Not on file  Tobacco Use   Smoking status: Never   Smokeless tobacco: Never  Vaping Use   Vaping Use: Never used  Substance and Sexual Activity   Alcohol use: No   Drug use: No   Sexual activity: Not on file  Other Topics Concern   Not on file  Social History Narrative   Not on file   Social Determinants of Health   Financial Resource Strain: Not on file  Food Insecurity: Not on file  Transportation Needs: Not on file  Physical Activity: Not on file  Stress: Not on file  Social Connections: Not on file  Intimate Partner Violence: Not on file     Labs: 06/07/21 TC 237, TG 143, HDL 68, LDL 140  Past Medical History:  Diagnosis Date   Coronary artery disease    Gout    Hypercholesteremia    Hypertension    Injury of both lungs 1986   water in lungs    Current Outpatient Medications on File Prior to Visit  Medication Sig Dispense Refill   acetaminophen (TYLENOL) 325 MG tablet Take 650 mg by mouth every 6 (six) hours as needed for headache.  allopurinol (ZYLOPRIM) 100 MG tablet TAKE 1 TABLET BY MOUTH ONCE DAILY 30 tablet 3   aspirin EC 81 MG tablet Take 81 mg by mouth daily. Swallow whole.     atovaquone (MEPRON) 750 MG/5ML suspension Take 1,500 mg by mouth daily.     icosapent Ethyl (VASCEPA) 1 g capsule Take 2 capsules (2 g total) by mouth 2 (two) times daily. 120 capsule 0   lisinopril (ZESTRIL) 5 MG tablet Take 5 mg by mouth daily.     loratadine (CLARITIN) 10 MG tablet Take 10 mg by mouth daily as needed for allergies.     metoprolol tartrate (LOPRESSOR) 25 MG tablet Take 0.5 tablets (12.5 mg total) by mouth 2 (two) times daily. 180 tablet 3   ondansetron (ZOFRAN) 4 MG tablet TAKE 1 TABLET BY MOUTH EVERY 8 HOURS AS NEEDED FOR NAUSEA AND VOMITING 20 tablet 0   oxyCODONE (OXY IR/ROXICODONE) 5 MG immediate release tablet Take 1 tablet (5 mg total) by mouth every 6 (six) hours as needed for severe  pain. 20 tablet 0   predniSONE (DELTASONE) 20 MG tablet Take 20 mg by mouth daily with breakfast. Taking 3 tablets daily     rosuvastatin (CRESTOR) 20 MG tablet Take 1 tablet (20 mg total) by mouth daily. 30 tablet 1   vitamin C (ASCORBIC ACID) 500 MG tablet Take 500 mg by mouth daily.     zinc gluconate 50 MG tablet Take 50 mg by mouth daily.     [DISCONTINUED] hydrochlorothiazide (HYDRODIURIL) 25 MG tablet Take 1 tablet (25 mg total) by mouth daily. 90 tablet 3   No current facility-administered medications on file prior to visit.    No Known Allergies  Assessment/Plan:  1. Hyperlipidemia - LDL is above goal of <55. Reviewed PCSK9i. His insurance covers Repatha. Discussed side effects in detail. Reviewed injection technique. Patient is willing to try. Will submit prior authorization for Repatha. Continue Vascepa 2g BID. Good improvement in TG has been seen. I faxed labs fro 7/27 to Dr. Valentino Nose per wife's request. Recheck labs in 2 months after starting Repatha.   Thank you,   Olene Floss, Pharm.D, BCPS, CPP [facility]  [address], [place], [place] [address]  Phone: [phone]; Fax: [phone]

## 2021-06-16 ENCOUNTER — Ambulatory Visit (HOSPITAL_COMMUNITY): Payer: Federal, State, Local not specified - PPO

## 2021-06-19 ENCOUNTER — Ambulatory Visit (HOSPITAL_COMMUNITY): Payer: Federal, State, Local not specified - PPO

## 2021-06-21 ENCOUNTER — Ambulatory Visit (HOSPITAL_COMMUNITY): Payer: Federal, State, Local not specified - PPO

## 2021-06-22 DIAGNOSIS — N050 Unspecified nephritic syndrome with minor glomerular abnormality: Secondary | ICD-10-CM | POA: Diagnosis not present

## 2021-06-22 DIAGNOSIS — N049 Nephrotic syndrome with unspecified morphologic changes: Secondary | ICD-10-CM | POA: Diagnosis not present

## 2021-06-22 DIAGNOSIS — T50905A Adverse effect of unspecified drugs, medicaments and biological substances, initial encounter: Secondary | ICD-10-CM | POA: Diagnosis not present

## 2021-06-22 DIAGNOSIS — I2510 Atherosclerotic heart disease of native coronary artery without angina pectoris: Secondary | ICD-10-CM | POA: Diagnosis not present

## 2021-06-23 ENCOUNTER — Ambulatory Visit (HOSPITAL_COMMUNITY): Payer: Federal, State, Local not specified - PPO

## 2021-06-26 ENCOUNTER — Ambulatory Visit (HOSPITAL_COMMUNITY): Payer: Federal, State, Local not specified - PPO

## 2021-06-28 ENCOUNTER — Ambulatory Visit (HOSPITAL_COMMUNITY): Payer: Federal, State, Local not specified - PPO

## 2021-06-30 ENCOUNTER — Ambulatory Visit (HOSPITAL_COMMUNITY): Payer: Federal, State, Local not specified - PPO

## 2021-06-30 NOTE — Telephone Encounter (Signed)
Received form via email. I have faxed the representative form and appeals letter to [facility]

## 2021-07-03 ENCOUNTER — Ambulatory Visit (HOSPITAL_COMMUNITY): Payer: Federal, State, Local not specified - PPO

## 2021-07-05 ENCOUNTER — Ambulatory Visit (HOSPITAL_COMMUNITY): Payer: Federal, State, Local not specified - PPO

## 2021-07-07 ENCOUNTER — Ambulatory Visit (HOSPITAL_COMMUNITY): Payer: Federal, State, Local not specified - PPO

## 2021-07-11 MED ORDER — REPATHA SURECLICK 140 MG/ML ~~LOC~~ SOAJ: 1.0000 "pen " | SUBCUTANEOUS | 11 refills | Status: DC

## 2021-07-11 NOTE — Addendum Note (Signed)
Addended by: Malena Peer D on: 07/11/2021 02:37 PM   Modules accepted: Orders

## 2021-07-13 ENCOUNTER — Other Ambulatory Visit: Payer: Self-pay | Admitting: Physician Assistant

## 2021-07-26 DIAGNOSIS — I2510 Atherosclerotic heart disease of native coronary artery without angina pectoris: Secondary | ICD-10-CM | POA: Diagnosis not present

## 2021-07-26 DIAGNOSIS — I129 Hypertensive chronic kidney disease with stage 1 through stage 4 chronic kidney disease, or unspecified chronic kidney disease: Secondary | ICD-10-CM | POA: Diagnosis not present

## 2021-07-26 DIAGNOSIS — E785 Hyperlipidemia, unspecified: Secondary | ICD-10-CM | POA: Diagnosis not present

## 2021-07-26 DIAGNOSIS — N050 Unspecified nephritic syndrome with minor glomerular abnormality: Secondary | ICD-10-CM | POA: Diagnosis not present

## 2021-08-16 ENCOUNTER — Telehealth: Payer: Self-pay | Admitting: Pharmacist

## 2021-08-16 NOTE — Telephone Encounter (Signed)
Patient's wife called to set up labs. Patient needs LFT and Lipids. Wife states that patient is getting labs done at [facility] on 10/17 for Dr. Valentino Nose. I will send message to Dr. Valentino Nose to see if we can coordinate labs and see what labs he ordered so we do not duplicate.

## 2021-08-30 DIAGNOSIS — N179 Acute kidney failure, unspecified: Secondary | ICD-10-CM | POA: Diagnosis not present

## 2021-08-30 DIAGNOSIS — N049 Nephrotic syndrome with unspecified morphologic changes: Secondary | ICD-10-CM | POA: Diagnosis not present

## 2021-08-30 DIAGNOSIS — I129 Hypertensive chronic kidney disease with stage 1 through stage 4 chronic kidney disease, or unspecified chronic kidney disease: Secondary | ICD-10-CM | POA: Diagnosis not present

## 2021-09-12 DIAGNOSIS — N1831 Chronic kidney disease, stage 3a: Secondary | ICD-10-CM | POA: Diagnosis not present

## 2021-09-12 DIAGNOSIS — I1310 Hypertensive heart and chronic kidney disease without heart failure, with stage 1 through stage 4 chronic kidney disease, or unspecified chronic kidney disease: Secondary | ICD-10-CM | POA: Diagnosis not present

## 2021-09-22 DIAGNOSIS — N179 Acute kidney failure, unspecified: Secondary | ICD-10-CM | POA: Diagnosis not present

## 2021-09-25 DIAGNOSIS — E785 Hyperlipidemia, unspecified: Secondary | ICD-10-CM | POA: Diagnosis not present

## 2021-09-25 DIAGNOSIS — N050 Unspecified nephritic syndrome with minor glomerular abnormality: Secondary | ICD-10-CM | POA: Diagnosis not present

## 2021-09-25 DIAGNOSIS — I129 Hypertensive chronic kidney disease with stage 1 through stage 4 chronic kidney disease, or unspecified chronic kidney disease: Secondary | ICD-10-CM | POA: Diagnosis not present

## 2021-09-25 DIAGNOSIS — M109 Gout, unspecified: Secondary | ICD-10-CM | POA: Diagnosis not present

## 2021-10-18 DIAGNOSIS — N179 Acute kidney failure, unspecified: Secondary | ICD-10-CM | POA: Diagnosis not present

## 2021-10-23 DIAGNOSIS — T50905A Adverse effect of unspecified drugs, medicaments and biological substances, initial encounter: Secondary | ICD-10-CM | POA: Diagnosis not present

## 2021-10-23 DIAGNOSIS — E785 Hyperlipidemia, unspecified: Secondary | ICD-10-CM | POA: Diagnosis not present

## 2021-10-23 DIAGNOSIS — I129 Hypertensive chronic kidney disease with stage 1 through stage 4 chronic kidney disease, or unspecified chronic kidney disease: Secondary | ICD-10-CM | POA: Diagnosis not present

## 2021-10-23 DIAGNOSIS — N050 Unspecified nephritic syndrome with minor glomerular abnormality: Secondary | ICD-10-CM | POA: Diagnosis not present

## 2021-10-25 ENCOUNTER — Ambulatory Visit: Payer: Federal, State, Local not specified - PPO | Admitting: Cardiovascular Disease

## 2021-10-25 ENCOUNTER — Other Ambulatory Visit: Payer: Self-pay | Admitting: Pharmacist

## 2021-10-25 ENCOUNTER — Encounter: Payer: Self-pay | Admitting: Cardiovascular Disease

## 2021-10-25 ENCOUNTER — Other Ambulatory Visit: Payer: Self-pay

## 2021-10-25 VITALS — BP 116/74 | HR 67 | Ht 66.0 in | Wt 163.6 lb

## 2021-10-25 DIAGNOSIS — I2510 Atherosclerotic heart disease of native coronary artery without angina pectoris: Secondary | ICD-10-CM

## 2021-10-25 DIAGNOSIS — I10 Essential (primary) hypertension: Secondary | ICD-10-CM

## 2021-10-25 MED ORDER — REPATHA SURECLICK 140 MG/ML ~~LOC~~ SOAJ: 1.0000 "pen " | SUBCUTANEOUS | 3 refills | Status: DC

## 2021-10-25 NOTE — Patient Instructions (Signed)
Medication Instructions:  ° °Your physician recommends that you continue on your current medications as directed. Please refer to the Current Medication list given to you today. ° °*If you need a refill on your cardiac medications before your next appointment, please call your pharmacy* ° ° ° °Follow-Up: °At [facility], you and your health needs are our priority.  As part of our continuing mission to provide you with exceptional heart care, we have created designated Provider Care Teams.  These Care Teams include your primary Cardiologist (physician) and Advanced Practice Providers (APPs -  Physician Assistants and Nurse Practitioners) who all work together to provide you with the care you need, when you need it. ° °We recommend signing up for the patient portal called "MyChart".  Sign up information is provided on this After Visit Summary.  MyChart is used to connect with patients for Virtual Visits (Telemedicine).  Patients are able to view lab/test results, encounter notes, upcoming appointments, etc.  Non-urgent messages can be sent to your provider as well.   °To learn more about what you can do with MyChart, go to [email or web address].   ° °Your next appointment:   °1 year(s) ° °The format for your next appointment:   °In Person ° °Provider:   °Philip Nahser, MD { ° ° °

## 2021-10-25 NOTE — Progress Notes (Signed)
Cardiology Office Note:    Date:  10/25/2021   ID:  Rodney Strickland, DOB [birth year], MRN [id number]  PCP:  Rodney Garbe, MD  [facility] Cardiologist:  [facility] Electrophysiologist:  None   Referring MD: Rodney Garbe, MD   Chief Complaint  Patient presents with   Coronary Artery Disease        Hypertension         Jan. 27, 2022    Rodney Strickland is a 54 y.o. male with a hx of chest pain.  We were asked to see him for further evaluation of his chest pain by Dr. Gustavus Messing.  Seen  With wife Rodney Strickland  ( works on [employer] [address])   CP started 2 weeks ago.   Associated with HTN He started BP meds,  Now he does not have CP but does have fatiigue.  He went to the ER  BP is typically around 140s / 80s  Had been eating lots more salt foods prior to  His ER visit .  Troponin was negative in the ER.   His symptoms are consistent with tightness and pressure-like sensation.  Last for a few minutes and seems to be associated with exertion.  He also notes that it was much worse when his blood pressure was up.  Has not had stress test .   March 07, 2021: Rodney Strickland is seen today for follow up visi t He has had CABG.  Has lost lots of weight  Wt is 139   He was hospitalized in March.  He was thought at some point to have pericardial tamponade.  Right and right heart cath revealed normal RV filling pressures.  Echocardiogram shows normal left ventricular systolic function.  Has had a renal bx - Was started on steroids,  Renal function improved.  Was on Dapsone  - but this caused liver enzymes.  Had significant elevation of his creatinine - up to 3.2 In March, 2022   October 25, 2021:  Rodney Strickland is seen today for follow-up of his coronary artery bypass grafting. Weight today is 137 pounds. He is seeing nephrology for his kidney issues. Has significant proteinuria -- responsive to prenisone Nephrology has suggest "minimal change disease "  Is currently on prednisone 40  mg a day.  Creatinine from Oct 19 shows a creatinine of 1.01.   Past Medical History:  Diagnosis Date   Coronary artery disease    Gout    Hypercholesteremia    Hypertension    Injury of both lungs 1986   water in lungs    Past Surgical History:  Procedure Laterality Date   CARDIAC CATHETERIZATION     CHOLECYSTECTOMY     CORONARY ARTERY BYPASS GRAFT N/A 01/05/2021   Procedure: CORONARY ARTERY BYPASS GRAFTING (CABG) TIMES FOUR, USING BILATERAL INTERNAL MAMMARY ARTERIES AND LEFT RADIAL ARTERY;  Surgeon: [name], MD;  Location: [facility] OR;  Service: Open Heart Surgery;  Laterality: N/A;   IR THORACENTESIS ASP PLEURAL SPACE W/IMG GUIDE  01/19/2021   LEFT HEART CATH AND CORONARY ANGIOGRAPHY N/A 12/29/2020   Procedure: LEFT HEART CATH AND CORONARY ANGIOGRAPHY;  Surgeon: Yvonne Kendall, MD;  Location: [facility] INVASIVE CV LAB;  Service: Cardiovascular;  Laterality: N/A;   RADIAL ARTERY HARVEST Left 01/05/2021   Procedure: RADIAL ARTERY HARVEST;  Surgeon: [name], MD;  Location: [facility] OR;  Service: Open Heart Surgery;  Laterality: Left;   RIGHT HEART CATH N/A 01/20/2021   Procedure: RIGHT HEART CATH;  Surgeon: Larey Dresser, MD;  Location: [place] CV LAB;  Service: Cardiovascular;  Laterality: N/A;   TEE WITHOUT CARDIOVERSION N/A 01/05/2021   Procedure: TRANSESOPHAGEAL ECHOCARDIOGRAM (TEE);  Surgeon: Wonda Olds, MD;  Location: [place];  Service: Open Heart Surgery;  Laterality: N/A;   THORACENTESIS      Current Medications: Current Meds  Medication Sig   acetaminophen (TYLENOL) 325 MG tablet Take 650 mg by mouth every 6 (six) hours as needed for headache.   allopurinol (ZYLOPRIM) 100 MG tablet TAKE 1 TABLET BY MOUTH ONCE DAILY   aspirin EC 81 MG tablet Take 81 mg by mouth daily. Swallow whole.   famotidine (PEPCID) 20 MG tablet Take 20 mg by mouth daily.   icosapent Ethyl (VASCEPA) 1 g capsule Take 2 capsules (2 g total) by mouth 2 (two) times daily.   lisinopril  (ZESTRIL) 5 MG tablet Take 5 mg by mouth daily.   loratadine (CLARITIN) 10 MG tablet Take 10 mg by mouth daily as needed for allergies.   metoprolol tartrate (LOPRESSOR) 25 MG tablet Take 0.5 tablets (12.5 mg total) by mouth 2 (two) times daily.   predniSONE (DELTASONE) 10 MG tablet Take 40 mg by mouth daily.   sulfamethoxazole-trimethoprim (BACTRIM DS) 800-160 MG tablet Take 1 tablet by mouth 3 (three) times a week.   vitamin C (ASCORBIC ACID) 500 MG tablet Take 500 mg by mouth daily.   zinc gluconate 50 MG tablet Take 50 mg by mouth daily.   [DISCONTINUED] Evolocumab (REPATHA SURECLICK) XX123456 MG/ML SOAJ Inject 1 pen into the skin every 14 (fourteen) days.     Allergies:   Dapsone   Social History   Socioeconomic History   Marital status: Married    Spouse name: Not on file   Number of children: 3   Years of education: Not on file   Highest education level: Not on file  Occupational History   Not on file  Tobacco Use   Smoking status: Never   Smokeless tobacco: Never  Vaping Use   Vaping Use: Never used  Substance and Sexual Activity   Alcohol use: No   Drug use: No   Sexual activity: Not on file  Other Topics Concern   Not on file  Social History Narrative   Not on file   Social Determinants of Health   Financial Resource Strain: Not on file  Food Insecurity: Not on file  Transportation Needs: Not on file  Physical Activity: Not on file  Stress: Not on file  Social Connections: Not on file     Family History: The patient's family history includes Diabetes type II in his mother; Hypertension in his father and sister; Stroke in his father.  ROS:   Please see the history of present illness.     All other systems reviewed and are negative.  EKGs/Labs/Other Studies Reviewed:    The following studies were reviewed today:   EKG:   Recent Labs: 11/26/2020: TSH 3.252 01/06/2021: Magnesium 2.8 01/19/2021: B Natriuretic Peptide 185.2 02/07/2021: Hemoglobin 8.1;  Platelets 361 04/11/2021: ALT 24; BUN 22; Creatinine, Ser 1.01; Potassium 4.2; Sodium 142  Recent Lipid Panel    Component Value Date/Time   CHOL 286 (H) 04/11/2021 0827   TRIG 120 04/11/2021 0827   HDL 101 04/11/2021 0827   CHOLHDL 4.8 12/08/2020 1552   LDLCALC 165 (H) 04/11/2021 0827     Risk Assessment/Calculations:       Physical Exam:     Physical Exam: Blood pressure  116/74, pulse 67, height 5\' 6"  (1.676 m), weight 163 lb 9.6 oz (74.2 kg), SpO2 97 %.  GEN:  Well nourished, well developed in no acute distress HEENT: Normal NECK: No JVD; No carotid bruits LYMPHATICS: No lymphadenopathy CARDIAC: RRR , no murmurs, rubs, gallops, sternum looks good RESPIRATORY:  Clear to auscultation without rales, wheezing or rhonchi  ABDOMEN: Soft, non-tender, non-distended MUSCULOSKELETAL:  No edema; No deformity  SKIN: Warm and dry NEUROLOGIC:  Alert and oriented x 3     ASSESSMENT:    1. Coronary artery disease involving native coronary artery of native heart without angina pectoris   2. Hypertension, unspecified type    PLAN:      1.  CAD  CABG Sternal infection -   His sternal infection has cleared .   Stable   2.  Hypertension:   BP is well controlled.   Cont meds.   3. CKD:   Has "minimal change disease". Has proteinuria and elevated creatinine that are responsive to prednisone  Further plans per nephrology    Medication Adjustments/Labs and Tests Ordered: Current medicines are reviewed at length with the patient today.  Concerns regarding medicines are outlined above.  No orders of the defined types were placed in this encounter.  No orders of the defined types were placed in this encounter.    Patient Instructions  Medication Instructions:   Your physician recommends that you continue on your current medications as directed. Please refer to the Current Medication list given to you today.  *If you need a refill on your cardiac medications before your  next appointment, please call your pharmacy*  Follow-Up: At [facility], you and your health needs are our priority.  As Strickland of our continuing mission to provide you with exceptional heart care, we have created designated Provider Care Teams.  These Care Teams include your primary Cardiologist (physician) and Advanced Practice Providers (APPs -  Physician Assistants and Nurse Practitioners) who all work together to provide you with the care you need, when you need it.  We recommend signing up for the patient portal called "MyChart".  Sign up information is provided on this After Visit Summary.  MyChart is used to connect with patients for Virtual Visits (Telemedicine).  Patients are able to view lab/test results, encounter notes, upcoming appointments, etc.  Non-urgent messages can be sent to your provider as well.   To learn more about what you can do with MyChart, go to [email or web address].    Your next appointment:   1 year(s)  The format for your next appointment:   In Person  Provider:   Mertie Moores, MD {    Signed, Mertie Moores, MD  10/25/2021 5:56 PM    [place]

## 2021-10-30 DIAGNOSIS — N179 Acute kidney failure, unspecified: Secondary | ICD-10-CM | POA: Diagnosis not present

## 2021-11-03 DIAGNOSIS — N179 Acute kidney failure, unspecified: Secondary | ICD-10-CM | POA: Diagnosis not present

## 2021-11-08 ENCOUNTER — Telehealth: Payer: Self-pay | Admitting: Pharmacist

## 2021-11-08 NOTE — Telephone Encounter (Signed)
Patients wife called about repeating lab work. States he has labs schedule to do with Dr. Valentino Nose on 1/3. Do not need lipids. Will call Dr. Valentino Nose for LFT. They are going out of town and will not be able to refil Repatha before they leave. [place] are going [place] of [place] 2 weeks. I offered a sample to tide over. Wife will pick up. Patient is back on high dose predisone. Was put on bactrim but LFT elevated. Pt wife concerned about him being on abx long term.  I advised she discuss with Dr. Valentino Nose.

## 2021-11-14 DIAGNOSIS — I129 Hypertensive chronic kidney disease with stage 1 through stage 4 chronic kidney disease, or unspecified chronic kidney disease: Secondary | ICD-10-CM | POA: Diagnosis not present

## 2021-11-20 DIAGNOSIS — N179 Acute kidney failure, unspecified: Secondary | ICD-10-CM | POA: Diagnosis not present

## 2021-11-20 NOTE — Telephone Encounter (Signed)
Pt picked up 1 sample of Repatha 140mg .

## 2021-11-22 DIAGNOSIS — I129 Hypertensive chronic kidney disease with stage 1 through stage 4 chronic kidney disease, or unspecified chronic kidney disease: Secondary | ICD-10-CM | POA: Diagnosis not present

## 2021-11-22 DIAGNOSIS — I2510 Atherosclerotic heart disease of native coronary artery without angina pectoris: Secondary | ICD-10-CM | POA: Diagnosis not present

## 2021-11-22 DIAGNOSIS — N050 Unspecified nephritic syndrome with minor glomerular abnormality: Secondary | ICD-10-CM | POA: Diagnosis not present

## 2021-11-22 DIAGNOSIS — E785 Hyperlipidemia, unspecified: Secondary | ICD-10-CM | POA: Diagnosis not present

## 2021-12-08 ENCOUNTER — Ambulatory Visit: Payer: Federal, State, Local not specified - PPO | Admitting: Cardiovascular Disease

## 2021-12-18 DIAGNOSIS — N049 Nephrotic syndrome with unspecified morphologic changes: Secondary | ICD-10-CM | POA: Diagnosis not present

## 2021-12-22 DIAGNOSIS — N049 Nephrotic syndrome with unspecified morphologic changes: Secondary | ICD-10-CM | POA: Diagnosis not present

## 2021-12-25 DIAGNOSIS — N050 Unspecified nephritic syndrome with minor glomerular abnormality: Secondary | ICD-10-CM | POA: Diagnosis not present

## 2021-12-25 DIAGNOSIS — I129 Hypertensive chronic kidney disease with stage 1 through stage 4 chronic kidney disease, or unspecified chronic kidney disease: Secondary | ICD-10-CM | POA: Diagnosis not present

## 2021-12-25 DIAGNOSIS — E785 Hyperlipidemia, unspecified: Secondary | ICD-10-CM | POA: Diagnosis not present

## 2021-12-25 DIAGNOSIS — I2510 Atherosclerotic heart disease of native coronary artery without angina pectoris: Secondary | ICD-10-CM | POA: Diagnosis not present

## 2022-01-18 ENCOUNTER — Encounter: Payer: Self-pay | Admitting: Pharmacist

## 2022-01-18 DIAGNOSIS — E785 Hyperlipidemia, unspecified: Secondary | ICD-10-CM

## 2022-01-19 DIAGNOSIS — N179 Acute kidney failure, unspecified: Secondary | ICD-10-CM | POA: Diagnosis not present

## 2022-01-19 DIAGNOSIS — E785 Hyperlipidemia, unspecified: Secondary | ICD-10-CM | POA: Diagnosis not present

## 2022-01-22 DIAGNOSIS — I2510 Atherosclerotic heart disease of native coronary artery without angina pectoris: Secondary | ICD-10-CM | POA: Diagnosis not present

## 2022-01-22 DIAGNOSIS — E785 Hyperlipidemia, unspecified: Secondary | ICD-10-CM | POA: Diagnosis not present

## 2022-01-22 DIAGNOSIS — N050 Unspecified nephritic syndrome with minor glomerular abnormality: Secondary | ICD-10-CM | POA: Diagnosis not present

## 2022-01-22 DIAGNOSIS — I129 Hypertensive chronic kidney disease with stage 1 through stage 4 chronic kidney disease, or unspecified chronic kidney disease: Secondary | ICD-10-CM | POA: Diagnosis not present

## 2022-01-26 ENCOUNTER — Telehealth: Payer: Self-pay

## 2022-01-26 NOTE — Telephone Encounter (Signed)
Called and spoke w/representative  who stated that they will fax [place] any labs within the psat year and I voiced understanding ?

## 2022-01-26 NOTE — Telephone Encounter (Signed)
-----   Message from Olene Floss, RPH-CPP sent at 01/26/2022 11:39 AM EDT ----- ?Can you call [facility] and get the recent labs from this patient faxed to [phone]? ?----- Message ----- ?From: Darnell Level, MD ?Sent: 01/19/2022   7:55 AM EDT ?To: Olene Floss, RPH-CPP ? ?Yep can do! ?----- Message ----- ?From: Olene Floss, RPH-CPP ?Sent: 01/19/2022   7:32 AM EST ?To: Darnell Level, MD ? ?Hi Dr. Valentino Nose, ? ?Mr. Lamichael is getting some lab work for you soon and he requested that he also get a lipid panel and LFT at that time. Would you mind ordering with the rest of the labs you need? ? ?Thanks ? ?Melissa ? ?Olene Floss, Pharm.D, BCPS, CPP ?[facility]  ?[address], [place], [place] [address]  ?Phone: [phone]; Fax: [phone]  ? ? ? ? ?

## 2022-03-02 DIAGNOSIS — E785 Hyperlipidemia, unspecified: Secondary | ICD-10-CM | POA: Diagnosis not present

## 2022-03-02 DIAGNOSIS — N050 Unspecified nephritic syndrome with minor glomerular abnormality: Secondary | ICD-10-CM | POA: Diagnosis not present

## 2022-03-05 DIAGNOSIS — I2510 Atherosclerotic heart disease of native coronary artery without angina pectoris: Secondary | ICD-10-CM | POA: Diagnosis not present

## 2022-03-05 DIAGNOSIS — I129 Hypertensive chronic kidney disease with stage 1 through stage 4 chronic kidney disease, or unspecified chronic kidney disease: Secondary | ICD-10-CM | POA: Diagnosis not present

## 2022-03-05 DIAGNOSIS — N050 Unspecified nephritic syndrome with minor glomerular abnormality: Secondary | ICD-10-CM | POA: Diagnosis not present

## 2022-03-05 DIAGNOSIS — E785 Hyperlipidemia, unspecified: Secondary | ICD-10-CM | POA: Diagnosis not present

## 2022-04-16 DIAGNOSIS — N050 Unspecified nephritic syndrome with minor glomerular abnormality: Secondary | ICD-10-CM | POA: Diagnosis not present

## 2022-04-17 DIAGNOSIS — I129 Hypertensive chronic kidney disease with stage 1 through stage 4 chronic kidney disease, or unspecified chronic kidney disease: Secondary | ICD-10-CM | POA: Diagnosis not present

## 2022-04-17 DIAGNOSIS — I2510 Atherosclerotic heart disease of native coronary artery without angina pectoris: Secondary | ICD-10-CM | POA: Diagnosis not present

## 2022-04-17 DIAGNOSIS — N050 Unspecified nephritic syndrome with minor glomerular abnormality: Secondary | ICD-10-CM | POA: Diagnosis not present

## 2022-04-17 DIAGNOSIS — E785 Hyperlipidemia, unspecified: Secondary | ICD-10-CM | POA: Diagnosis not present

## 2022-05-01 DIAGNOSIS — N179 Acute kidney failure, unspecified: Secondary | ICD-10-CM | POA: Diagnosis not present

## 2022-05-21 DIAGNOSIS — N179 Acute kidney failure, unspecified: Secondary | ICD-10-CM | POA: Diagnosis not present

## 2022-05-21 DIAGNOSIS — E782 Mixed hyperlipidemia: Secondary | ICD-10-CM | POA: Diagnosis not present

## 2022-05-21 DIAGNOSIS — D649 Anemia, unspecified: Secondary | ICD-10-CM | POA: Diagnosis not present

## 2022-05-21 DIAGNOSIS — I1310 Hypertensive heart and chronic kidney disease without heart failure, with stage 1 through stage 4 chronic kidney disease, or unspecified chronic kidney disease: Secondary | ICD-10-CM | POA: Diagnosis not present

## 2022-05-21 DIAGNOSIS — R809 Proteinuria, unspecified: Secondary | ICD-10-CM | POA: Diagnosis not present

## 2022-05-21 DIAGNOSIS — I25810 Atherosclerosis of coronary artery bypass graft(s) without angina pectoris: Secondary | ICD-10-CM | POA: Diagnosis not present

## 2022-05-21 DIAGNOSIS — Z0000 Encounter for general adult medical examination without abnormal findings: Secondary | ICD-10-CM | POA: Diagnosis not present

## 2022-05-21 DIAGNOSIS — N1831 Chronic kidney disease, stage 3a: Secondary | ICD-10-CM | POA: Diagnosis not present

## 2022-05-21 DIAGNOSIS — N050 Unspecified nephritic syndrome with minor glomerular abnormality: Secondary | ICD-10-CM | POA: Diagnosis not present

## 2022-05-25 DIAGNOSIS — E785 Hyperlipidemia, unspecified: Secondary | ICD-10-CM | POA: Diagnosis not present

## 2022-05-25 DIAGNOSIS — N050 Unspecified nephritic syndrome with minor glomerular abnormality: Secondary | ICD-10-CM | POA: Diagnosis not present

## 2022-05-25 DIAGNOSIS — I129 Hypertensive chronic kidney disease with stage 1 through stage 4 chronic kidney disease, or unspecified chronic kidney disease: Secondary | ICD-10-CM | POA: Diagnosis not present

## 2022-05-25 DIAGNOSIS — I2510 Atherosclerotic heart disease of native coronary artery without angina pectoris: Secondary | ICD-10-CM | POA: Diagnosis not present

## 2022-05-31 DIAGNOSIS — Z1331 Encounter for screening for depression: Secondary | ICD-10-CM | POA: Diagnosis not present

## 2022-05-31 DIAGNOSIS — Z0000 Encounter for general adult medical examination without abnormal findings: Secondary | ICD-10-CM | POA: Diagnosis not present

## 2022-05-31 DIAGNOSIS — N050 Unspecified nephritic syndrome with minor glomerular abnormality: Secondary | ICD-10-CM | POA: Diagnosis not present

## 2022-05-31 DIAGNOSIS — Z1389 Encounter for screening for other disorder: Secondary | ICD-10-CM | POA: Diagnosis not present

## 2022-06-18 DIAGNOSIS — N179 Acute kidney failure, unspecified: Secondary | ICD-10-CM | POA: Diagnosis not present

## 2022-06-28 DIAGNOSIS — I129 Hypertensive chronic kidney disease with stage 1 through stage 4 chronic kidney disease, or unspecified chronic kidney disease: Secondary | ICD-10-CM | POA: Diagnosis not present

## 2022-06-28 DIAGNOSIS — I2510 Atherosclerotic heart disease of native coronary artery without angina pectoris: Secondary | ICD-10-CM | POA: Diagnosis not present

## 2022-06-28 DIAGNOSIS — N050 Unspecified nephritic syndrome with minor glomerular abnormality: Secondary | ICD-10-CM | POA: Diagnosis not present

## 2022-06-28 DIAGNOSIS — E785 Hyperlipidemia, unspecified: Secondary | ICD-10-CM | POA: Diagnosis not present

## 2022-07-30 DIAGNOSIS — N049 Nephrotic syndrome with unspecified morphologic changes: Secondary | ICD-10-CM | POA: Diagnosis not present

## 2022-08-07 DIAGNOSIS — N050 Unspecified nephritic syndrome with minor glomerular abnormality: Secondary | ICD-10-CM | POA: Diagnosis not present

## 2022-08-07 DIAGNOSIS — I2510 Atherosclerotic heart disease of native coronary artery without angina pectoris: Secondary | ICD-10-CM | POA: Diagnosis not present

## 2022-08-07 DIAGNOSIS — I129 Hypertensive chronic kidney disease with stage 1 through stage 4 chronic kidney disease, or unspecified chronic kidney disease: Secondary | ICD-10-CM | POA: Diagnosis not present

## 2022-08-07 DIAGNOSIS — E785 Hyperlipidemia, unspecified: Secondary | ICD-10-CM | POA: Diagnosis not present

## 2022-09-04 DIAGNOSIS — N049 Nephrotic syndrome with unspecified morphologic changes: Secondary | ICD-10-CM | POA: Diagnosis not present

## 2022-09-10 DIAGNOSIS — N050 Unspecified nephritic syndrome with minor glomerular abnormality: Secondary | ICD-10-CM | POA: Diagnosis not present

## 2022-09-10 DIAGNOSIS — I129 Hypertensive chronic kidney disease with stage 1 through stage 4 chronic kidney disease, or unspecified chronic kidney disease: Secondary | ICD-10-CM | POA: Diagnosis not present

## 2022-09-10 DIAGNOSIS — I2510 Atherosclerotic heart disease of native coronary artery without angina pectoris: Secondary | ICD-10-CM | POA: Diagnosis not present

## 2022-09-10 DIAGNOSIS — E785 Hyperlipidemia, unspecified: Secondary | ICD-10-CM | POA: Diagnosis not present

## 2022-10-08 ENCOUNTER — Other Ambulatory Visit: Payer: Self-pay | Admitting: Cardiovascular Disease

## 2022-10-08 DIAGNOSIS — E782 Mixed hyperlipidemia: Secondary | ICD-10-CM

## 2022-10-08 DIAGNOSIS — I2510 Atherosclerotic heart disease of native coronary artery without angina pectoris: Secondary | ICD-10-CM

## 2022-10-08 DIAGNOSIS — I200 Unstable angina: Secondary | ICD-10-CM

## 2022-10-09 DIAGNOSIS — N179 Acute kidney failure, unspecified: Secondary | ICD-10-CM | POA: Diagnosis not present

## 2022-10-15 ENCOUNTER — Encounter (HOSPITAL_COMMUNITY): Payer: Self-pay | Admitting: Emergency Medicine

## 2022-10-15 ENCOUNTER — Inpatient Hospital Stay (HOSPITAL_COMMUNITY)
Admission: EM | Admit: 2022-10-15 | Discharge: 2022-10-17 | DRG: 282 | Disposition: A | Discharge status: Home / Self Care | Payer: Federal, State, Local not specified - PPO | Attending: Internal Medicine | Admitting: Internal Medicine

## 2022-10-15 ENCOUNTER — Emergency Department (HOSPITAL_COMMUNITY): Payer: Federal, State, Local not specified - PPO

## 2022-10-15 DIAGNOSIS — I2510 Atherosclerotic heart disease of native coronary artery without angina pectoris: Secondary | ICD-10-CM | POA: Diagnosis not present

## 2022-10-15 DIAGNOSIS — M109 Gout, unspecified: Secondary | ICD-10-CM | POA: Diagnosis not present

## 2022-10-15 DIAGNOSIS — Z7952 Long term (current) use of systemic steroids: Secondary | ICD-10-CM | POA: Diagnosis not present

## 2022-10-15 DIAGNOSIS — Z951 Presence of aortocoronary bypass graft: Secondary | ICD-10-CM

## 2022-10-15 DIAGNOSIS — R9431 Abnormal electrocardiogram [ECG] [EKG]: Secondary | ICD-10-CM | POA: Diagnosis not present

## 2022-10-15 DIAGNOSIS — E785 Hyperlipidemia, unspecified: Secondary | ICD-10-CM | POA: Diagnosis not present

## 2022-10-15 DIAGNOSIS — I214 Non-ST elevation (NSTEMI) myocardial infarction: Secondary | ICD-10-CM | POA: Diagnosis not present

## 2022-10-15 DIAGNOSIS — Z888 Allergy status to other drugs, medicaments and biological substances status: Secondary | ICD-10-CM | POA: Diagnosis not present

## 2022-10-15 DIAGNOSIS — Z79899 Other long term (current) drug therapy: Secondary | ICD-10-CM

## 2022-10-15 DIAGNOSIS — Z8249 Family history of ischemic heart disease and other diseases of the circulatory system: Secondary | ICD-10-CM

## 2022-10-15 DIAGNOSIS — I252 Old myocardial infarction: Secondary | ICD-10-CM | POA: Diagnosis not present

## 2022-10-15 DIAGNOSIS — R0789 Other chest pain: Secondary | ICD-10-CM | POA: Diagnosis not present

## 2022-10-15 DIAGNOSIS — N050 Unspecified nephritic syndrome with minor glomerular abnormality: Secondary | ICD-10-CM | POA: Diagnosis not present

## 2022-10-15 DIAGNOSIS — E7800 Pure hypercholesterolemia, unspecified: Secondary | ICD-10-CM | POA: Diagnosis present

## 2022-10-15 DIAGNOSIS — R079 Chest pain, unspecified: Secondary | ICD-10-CM | POA: Diagnosis not present

## 2022-10-15 DIAGNOSIS — Z7982 Long term (current) use of aspirin: Secondary | ICD-10-CM

## 2022-10-15 DIAGNOSIS — I10 Essential (primary) hypertension: Secondary | ICD-10-CM | POA: Diagnosis not present

## 2022-10-15 DIAGNOSIS — I249 Acute ischemic heart disease, unspecified: Secondary | ICD-10-CM | POA: Diagnosis not present

## 2022-10-15 DIAGNOSIS — I129 Hypertensive chronic kidney disease with stage 1 through stage 4 chronic kidney disease, or unspecified chronic kidney disease: Secondary | ICD-10-CM | POA: Diagnosis not present

## 2022-10-15 DIAGNOSIS — Z9049 Acquired absence of other specified parts of digestive tract: Secondary | ICD-10-CM

## 2022-10-15 LAB — CBC WITH DIFFERENTIAL/PLATELET
Abs Immature Granulocytes: 0.02 10*3/uL (ref 0.00–0.07)
Basophils Absolute: 0 10*3/uL (ref 0.0–0.1)
Basophils Relative: 0 %
Eosinophils Absolute: 0.1 10*3/uL (ref 0.0–0.5)
Eosinophils Relative: 1 %
HCT: 42.7 % (ref 39.0–52.0)
Hemoglobin: 14.4 g/dL (ref 13.0–17.0)
Immature Granulocytes: 0 %
Lymphocytes Relative: 31 %
Lymphs Abs: 2.3 10*3/uL (ref 0.7–4.0)
MCH: 31.3 pg (ref 26.0–34.0)
MCHC: 33.7 g/dL (ref 30.0–36.0)
MCV: 92.8 fL (ref 80.0–100.0)
Monocytes Absolute: 0.7 10*3/uL (ref 0.1–1.0)
Monocytes Relative: 9 %
Neutro Abs: 4.2 10*3/uL (ref 1.7–7.7)
Neutrophils Relative %: 59 %
Platelets: 224 10*3/uL (ref 150–400)
RBC: 4.6 MIL/uL (ref 4.22–5.81)
RDW: 12 % (ref 11.5–15.5)
WBC: 7.3 10*3/uL (ref 4.0–10.5)
nRBC: 0 % (ref 0.0–0.2)

## 2022-10-15 LAB — TROPONIN I (HIGH SENSITIVITY)
Troponin I (High Sensitivity): 225 ng/L (ref <18)
Troponin I (High Sensitivity): 515 ng/L (ref <18)

## 2022-10-15 LAB — BASIC METABOLIC PANEL
Anion gap: 9 (ref 5–15)
BUN: 20 mg/dL (ref 6–20)
CO2: 22 mmol/L (ref 22–32)
Calcium: 9 mg/dL (ref 8.9–10.3)
Chloride: 105 mmol/L (ref 98–111)
Creatinine, Ser: 1.15 mg/dL (ref 0.61–1.24)
GFR, Estimated: >60 mL/min (ref >60)
Glucose, Bld: 98 mg/dL (ref 70–99)
Potassium: 4 mmol/L (ref 3.5–5.1)
Sodium: 136 mmol/L (ref 135–145)

## 2022-10-15 MED ORDER — HEPARIN (PORCINE) 25000 UT/250ML-% IV SOLN
800.0000 [IU]/h | INTRAVENOUS | Status: DC
  Administered 2022-10-16: 900 [IU]/h via INTRAVENOUS
  Filled 2022-10-15: qty 250

## 2022-10-15 MED ORDER — HEPARIN BOLUS VIA INFUSION
4000.0000 [IU] | Freq: Once | INTRAVENOUS | Status: AC
  Administered 2022-10-16: 4000 [IU] via INTRAVENOUS
  Filled 2022-10-15: qty 4000

## 2022-10-15 MED ORDER — ASPIRIN 81 MG PO CHEW
243.0000 mg | CHEWABLE_TABLET | Freq: Once | ORAL | Status: AC
  Administered 2022-10-16: 243 mg via ORAL
  Filled 2022-10-15: qty 3

## 2022-10-15 NOTE — ED Triage Notes (Signed)
Pt reports left sided chest pain since around 1300 today. Pt took OTC meds with no relief. Pain radiates to shoulder blade. Pt took 3 nitro that helped the pain and baby ASA. Hx of CABG.

## 2022-10-15 NOTE — ED Notes (Signed)
Troponin 225. Gerome Apley, PA informed.

## 2022-10-15 NOTE — Progress Notes (Signed)
ANTICOAGULATION CONSULT NOTE - Initial Consult  Pharmacy Consult for heparin Indication: chest pain/ACS  Allergies  Allergen Reactions   Dapsone     Other reaction(s): nausea and vomiting    Patient Measurements: Height: 5\' 6"  (167.6 cm) Weight: 72.6 kg (160 lb) IBW/kg (Calculated) : 63.8 Heparin Dosing Weight: TBW  Vital Signs: Temp: 98.3 F (36.8 C) (12/04 2057) Temp Source: Oral (12/04 1853) BP: 140/87 (12/04 2300) Pulse Rate: 64 (12/04 2300)  Labs: Recent Labs    10/15/22 1924  HGB 14.4  HCT 42.7  PLT 224  CREATININE 1.15  TROPONINIHS 225*    Estimated Creatinine Clearance: 64 mL/min (by C-G formula based on SCr of 1.15 mg/dL).   Medical History: Past Medical History:  Diagnosis Date   Coronary artery disease    Gout    Hypercholesteremia    Hypertension    Injury of both lungs 1986   water in lungs    Assessment: 86 YOM presenting with CP, elevated troponin, hx CAD.  He is not on anticoagulation PTA, CBC wnl  Goal of Therapy:  Heparin level 0.3-0.7 units/ml Monitor platelets by anticoagulation protocol: Yes   Plan:  Heparin 4000 units IV x 1, and gtt at 900 units/hr F/u 6 hour heparin level F/u cards eval and recs  57, PharmD, [facility] Clinical Pharmacist ED Pharmacist Phone # [phone] 10/15/2022 11:42 PM

## 2022-10-15 NOTE — ED Provider Notes (Signed)
[facility] EMERGENCY DEPARTMENT Provider Note   CSN: [id number] Arrival date & time: 10/15/22  1835     History  Chief Complaint  Patient presents with   Chest Pain    Rodney Strickland is a 56 y.o. male.  56 y/o male with hx of HTN, HLD, CAD s/p CABG x4 in Feb 2022 presents to the ED for evaluation of chest pain. Patient had onset of L sided, pressure-like chest pain radiating to the L scalpula around lunch time. Pain was constant since onset and not specifically exertional in nature; onset of pain while at rest. Chest pain rated at 8/10. He took 3 SL NTG prior to arrival with improvement in his pain. He is presently chest pain free. Denies associated weight gain, leg swelling, diaphoresis, lightheadedness, N/V, L arm pain, back pain. Has been compliant with his medications. Is on Repatha due to intolerance to statins (caused elevated LFTs).   Cards - Dr. Elease Hashimoto   The history is provided by the patient. No language interpreter was used.  Chest Pain      Home Medications Prior to Admission medications   Medication Sig Start Date End Date Taking? Authorizing Provider  acetaminophen (TYLENOL) 325 MG tablet Take 650 mg by mouth every 6 (six) hours as needed for headache.    [provider]  allopurinol (ZYLOPRIM) 100 MG tablet TAKE 1 TABLET BY MOUTH ONCE DAILY 09/06/20   Juliann Pares, DO  aspirin EC 81 MG tablet Take 81 mg by mouth daily. Swallow whole.    [provider]  atovaquone (MEPRON) 750 MG/5ML suspension Take 1,500 mg by mouth daily. Patient not taking: Reported on 10/25/2021 03/24/21   [provider]  Evolocumab (REPATHA SURECLICK) 140 MG/ML SOAJ INJECT 1 PEN INTO THE SKIN EVERY 14 DAYS. 10/10/22   Nahser, Deloris Ping, MD  famotidine (PEPCID) 20 MG tablet Take 20 mg by mouth daily.    [provider]  icosapent Ethyl (VASCEPA) 1 g capsule Take 2 capsules (2 g total) by mouth 2 (two) times daily. 01/22/21   Sharlene Dory, PA-C  lisinopril (ZESTRIL) 5 MG tablet Take 5 mg by mouth daily.    [provider]  loratadine (CLARITIN) 10 MG tablet Take 10 mg by mouth daily as needed for allergies.    [provider]  metoprolol tartrate (LOPRESSOR) 25 MG tablet TAKE 1 TABLET BY MOUTH TWICE A DAY 10/10/22   Nahser, Deloris Ping, MD  predniSONE (DELTASONE) 10 MG tablet Take 40 mg by mouth daily. 04/18/21   [provider]  sulfamethoxazole-trimethoprim (BACTRIM DS) 800-160 MG tablet Take 1 tablet by mouth 3 (three) times a week. 10/23/21   [provider]  vitamin C (ASCORBIC ACID) 500 MG tablet Take 500 mg by mouth daily.    [provider]  zinc gluconate 50 MG tablet Take 50 mg by mouth daily.    [provider]  hydrochlorothiazide (HYDRODIURIL) 25 MG tablet Take 1 tablet (25 mg total) by mouth daily. 12/25/20 01/10/21  Nahser, Deloris Ping, MD      Allergies    Dapsone    Review of Systems   Review of Systems  Cardiovascular:  Positive for chest pain.  Ten systems reviewed and are negative for acute change, except as noted in the HPI.    Physical Exam Updated Vital Signs BP (!) 144/86   Pulse (!) 55   Temp 98.3 F (36.8 C)   Resp 19   Ht 5\' 6"  (  1.676 m)   Wt 72.6 kg   SpO2 100%   BMI 25.82 kg/m   Physical Exam Vitals and nursing note reviewed.  Constitutional:      General: He is not in acute distress.    Appearance: He is well-developed. He is not diaphoretic.     Comments: Nontoxic appearing and in NAD  HENT:     Head: Normocephalic and atraumatic.  Eyes:     General: No scleral icterus.    Conjunctiva/sclera: Conjunctivae normal.  Cardiovascular:     Rate and Rhythm: Normal rate and regular rhythm.     Pulses: Normal pulses.  Pulmonary:     Effort: Pulmonary effort is normal. No respiratory distress.     Breath sounds: No stridor. No wheezing, rhonchi or rales.     Comments: Lungs CTAB. Respirations even and unlabored. Midsternal incision from  prior CABG; well healed. Musculoskeletal:        General: Normal range of motion.     Cervical back: Normal range of motion.  Skin:    General: Skin is warm and dry.     Coloration: Skin is not pale.     Findings: No erythema or rash.  Neurological:     Mental Status: He is alert and oriented to person, place, and time.  Psychiatric:        Behavior: Behavior normal.     ED Results / Procedures / Treatments   Labs (all labs ordered are listed, but only abnormal results are displayed) Labs Reviewed  TROPONIN I (HIGH SENSITIVITY) - Abnormal; Notable for the following components:      Result Value   Troponin I (High Sensitivity) 225 (*)    All other components within normal limits  TROPONIN I (HIGH SENSITIVITY) - Abnormal; Notable for the following components:   Troponin I (High Sensitivity) 515 (*)    All other components within normal limits  BASIC METABOLIC PANEL  CBC WITH DIFFERENTIAL/PLATELET  TACROLIMUS LEVEL  HEPARIN LEVEL (UNFRACTIONATED)  CBC    EKG EKG Interpretation  Date/Time:  Monday October 15 2022 18:43:26 EST Ventricular Rate:  65 PR Interval:  120 QRS Duration: 108 QT Interval:  402 QTC Calculation: 418 R Axis:   54 Text Interpretation: Normal sinus rhythm Incomplete right bundle branch block Borderline ECG Confirmed by Gerhard Munch [phone]) on 10/15/2022 11:34:43 PM  Radiology DG Chest 2 View  Result Date: 10/15/2022 CLINICAL DATA:  Left-sided chest pain. EXAM: CHEST - 2 VIEW COMPARISON:  02/16/2021. FINDINGS: The heart size and mediastinal contours are within normal limits. No consolidation, effusion, or pneumothorax. Sternotomy wires are present over the midline. Surgical clips are identified in the right upper quadrant. No acute osseous abnormality. IMPRESSION: No active cardiopulmonary disease. Electronically Signed   By: Thornell Sartorius M.D.   On: 10/15/2022 20:47    Procedures .Critical Care  Performed by: Antony Madura, PA-C Authorized by:  Antony Madura, PA-C   Critical care provider statement:    Critical care time (minutes):  30   Critical care time was exclusive of:  Separately billable procedures and treating other patients   Critical care was necessary to treat or prevent imminent or life-threatening deterioration of the following conditions:  Cardiac failure (NSTEMI)   Critical care was time spent personally by me on the following activities:  Development of treatment plan with patient or surrogate, discussions with consultants, evaluation of patient's response to treatment, examination of patient, ordering and review of laboratory studies, ordering and review of radiographic studies, ordering  and performing treatments and interventions, pulse oximetry, re-evaluation of patient's condition, review of old charts and obtaining history from patient or surrogate   I assumed direction of critical care for this patient from another provider in my specialty: no     Care discussed with: admitting provider       Medications Ordered in ED Medications  aspirin chewable tablet 243 mg (has no administration in time range)  heparin bolus via infusion 4,000 Units (has no administration in time range)  heparin ADULT infusion 100 units/mL (25000 units/23mL) (has no administration in time range)    ED Course/ Medical Decision Making/ A&P Clinical Course as of 10/15/22 2350  Mon Oct 15, 2022  2329 Spoke with Dr. Welton Flakes of Cardiology who will admit the patient. [KH]    Clinical Course User Index [KH] Antony Madura, PA-C                           Medical Decision Making Amount and/or Complexity of Data Reviewed Labs: ordered.  Risk OTC drugs. Decision regarding hospitalization.   This patient presents to the ED for concern of chest pain, this involves an extensive number of treatment options, and is a complaint that carries with it a high risk of complications and morbidity.  The differential diagnosis includes ACS vs PTX vs PNA vs  pleural effusion vs MSK   Co morbidities that complicate the patient evaluation  HTN HLD CAD   Additional history obtained:  Additional history obtained from wife at bedside External records from outside source obtained and reviewed including prior troponin value from 1 year ago which was normal at 17.   Lab Tests:  I Ordered, and personally interpreted labs.  The pertinent results include:  Trending troponin of 225>515. Normal CBC BMP   Imaging Studies ordered:  I ordered imaging studies including CXR  I independently visualized and interpreted imaging which showed no acute cardiopulmonary abnormality I agree with the radiologist interpretation   Cardiac Monitoring:  The patient was maintained on a cardiac monitor.  I personally viewed and interpreted the cardiac monitored which showed an underlying rhythm of: NSR   Medicines ordered and prescription drug management:  I ordered medication including ASA and heparin drip for presumed ACS  Reevaluation of the patient after these medicines showed that the patient stayed the same I have reviewed the patients home medicines and have made adjustments as needed   Test Considered:  UDS - no hx of drug use/abuse   Critical Interventions:  Initiation of heparin drip   Consultations Obtained:  I requested consultation with Dr. Welton Flakes of Cardiology. Discussed symptoms, history, ED work up. Dr. Welton Flakes to assess patient in the ED for admission.   Reevaluation:  After the interventions noted above, I reevaluated the patient and found that they have :stayed the same   Social Determinants of Health:  Insured patient   Dispostion:  After consideration of the diagnostic results and the patients response to treatment, I feel that the patent would benefit from admission for ongoing monitoring, troponin trending. Cardiology service to admit.         Final Clinical Impression(s) / ED Diagnoses Final diagnoses:   NSTEMI (non-ST elevated myocardial infarction) [facility])    Rx / DC Orders ED Discharge Orders     None         Antony Madura, PA-C 10/15/22 2350    Gerhard Munch, MD 10/16/22 2243

## 2022-10-15 NOTE — ED Provider Triage Note (Signed)
Emergency Medicine Provider Triage Evaluation Note  Rodney Strickland , a 56 y.o. male  was evaluated in triage.  Pt complains of chest pain.  Left-sided pressure that radiates to the left scapula.  He has seated palpitations without shortness of breath.  States that it began this afternoon.  Took 3 nitroglycerin and the pain began to subside after the third 1.  Still having low-grade chest pain.  He has a history of CABG.  History of hypertension.  States that he has been taking his medications as prescribed.  Sees Dr. Lourena Simmonds with cardiology.  Review of Systems  Positive: See above Negative:   Physical Exam  BP 125/87 (BP Location: Right Arm)   Pulse 70   Temp 98.2 F (36.8 C) (Oral)   Resp 16   Ht 5\' 6"  (1.676 m)   Wt 72.6 kg   SpO2 96%   BMI 25.82 kg/m  Gen:   Awake, no distress   Resp:  Normal effort  MSK:   Moves extremities without difficulty  Other:    Medical Decision Making  Medically screening exam initiated at 7:10 PM.  Appropriate orders placed.  was informed that the remainder of the evaluation will be completed by another provider, this initial triage assessment does not replace that evaluation, and the importance of remaining in the ED until their evaluation is complete.     [name], PA-C 10/15/22 1915

## 2022-10-16 ENCOUNTER — Inpatient Hospital Stay (HOSPITAL_COMMUNITY): Payer: Federal, State, Local not specified - PPO

## 2022-10-16 ENCOUNTER — Encounter (HOSPITAL_COMMUNITY)
Admission: EM | Disposition: A | Discharge status: Home / Self Care | Payer: Self-pay | Source: Home / Self Care | Attending: Internal Medicine

## 2022-10-16 ENCOUNTER — Other Ambulatory Visit: Payer: Self-pay

## 2022-10-16 DIAGNOSIS — Z7952 Long term (current) use of systemic steroids: Secondary | ICD-10-CM | POA: Diagnosis not present

## 2022-10-16 DIAGNOSIS — Z888 Allergy status to other drugs, medicaments and biological substances status: Secondary | ICD-10-CM | POA: Diagnosis not present

## 2022-10-16 DIAGNOSIS — M109 Gout, unspecified: Secondary | ICD-10-CM | POA: Diagnosis present

## 2022-10-16 DIAGNOSIS — Z951 Presence of aortocoronary bypass graft: Secondary | ICD-10-CM | POA: Diagnosis not present

## 2022-10-16 DIAGNOSIS — Z79899 Other long term (current) drug therapy: Secondary | ICD-10-CM | POA: Diagnosis not present

## 2022-10-16 DIAGNOSIS — I252 Old myocardial infarction: Secondary | ICD-10-CM | POA: Diagnosis not present

## 2022-10-16 DIAGNOSIS — I10 Essential (primary) hypertension: Secondary | ICD-10-CM | POA: Diagnosis not present

## 2022-10-16 DIAGNOSIS — I214 Non-ST elevation (NSTEMI) myocardial infarction: Secondary | ICD-10-CM

## 2022-10-16 DIAGNOSIS — R9431 Abnormal electrocardiogram [ECG] [EKG]: Secondary | ICD-10-CM | POA: Diagnosis not present

## 2022-10-16 DIAGNOSIS — Z9049 Acquired absence of other specified parts of digestive tract: Secondary | ICD-10-CM | POA: Diagnosis not present

## 2022-10-16 DIAGNOSIS — E7800 Pure hypercholesterolemia, unspecified: Secondary | ICD-10-CM | POA: Diagnosis not present

## 2022-10-16 DIAGNOSIS — I249 Acute ischemic heart disease, unspecified: Secondary | ICD-10-CM | POA: Diagnosis not present

## 2022-10-16 DIAGNOSIS — Z8249 Family history of ischemic heart disease and other diseases of the circulatory system: Secondary | ICD-10-CM | POA: Diagnosis not present

## 2022-10-16 DIAGNOSIS — I2510 Atherosclerotic heart disease of native coronary artery without angina pectoris: Secondary | ICD-10-CM | POA: Diagnosis not present

## 2022-10-16 DIAGNOSIS — Z7982 Long term (current) use of aspirin: Secondary | ICD-10-CM | POA: Diagnosis not present

## 2022-10-16 HISTORY — PX: LEFT HEART CATH AND CORS/GRAFTS ANGIOGRAPHY: CATH118250

## 2022-10-16 LAB — BASIC METABOLIC PANEL
Anion gap: 8 (ref 5–15)
BUN: 18 mg/dL (ref 6–20)
CO2: 23 mmol/L (ref 22–32)
Calcium: 8.8 mg/dL — ABNORMAL LOW (ref 8.9–10.3)
Chloride: 108 mmol/L (ref 98–111)
Creatinine, Ser: 1.07 mg/dL (ref 0.61–1.24)
GFR, Estimated: >60 mL/min (ref >60)
Glucose, Bld: 100 mg/dL — ABNORMAL HIGH (ref 70–99)
Potassium: 3.7 mmol/L (ref 3.5–5.1)
Sodium: 139 mmol/L (ref 135–145)

## 2022-10-16 LAB — CBC
HCT: 40 % (ref 39.0–52.0)
Hemoglobin: 14.4 g/dL (ref 13.0–17.0)
MCH: 32.7 pg (ref 26.0–34.0)
MCHC: 36 g/dL (ref 30.0–36.0)
MCV: 90.7 fL (ref 80.0–100.0)
Platelets: 198 10*3/uL (ref 150–400)
RBC: 4.41 MIL/uL (ref 4.22–5.81)
RDW: 11.9 % (ref 11.5–15.5)
WBC: 6.7 10*3/uL (ref 4.0–10.5)
nRBC: 0 % (ref 0.0–0.2)

## 2022-10-16 LAB — ECHOCARDIOGRAM COMPLETE
Area-P 1/2: 3.1 cm2
Height: 66 in
MV M vel: 1.06 m/s
MV Peak grad: 4.5 mmHg
S' Lateral: 2.6 cm
Weight: 2560 oz

## 2022-10-16 LAB — LIPID PANEL
Cholesterol: 145 mg/dL (ref 0–200)
HDL: 64 mg/dL (ref >40)
LDL Cholesterol: 74 mg/dL (ref 0–99)
Total CHOL/HDL Ratio: 2.3 RATIO
Triglycerides: 35 mg/dL (ref <150)
VLDL: 7 mg/dL (ref 0–40)

## 2022-10-16 LAB — PROTIME-INR
INR: 1 (ref 0.8–1.2)
Prothrombin Time: 13.1 seconds (ref 11.4–15.2)

## 2022-10-16 LAB — HEPARIN LEVEL (UNFRACTIONATED): Heparin Unfractionated: 0.7 IU/mL (ref 0.30–0.70)

## 2022-10-16 SURGERY — LEFT HEART CATH AND CORS/GRAFTS ANGIOGRAPHY
Anesthesia: LOCAL

## 2022-10-16 MED ORDER — ALLOPURINOL 100 MG PO TABS
100.0000 mg | ORAL_TABLET | Freq: Every day | ORAL | Status: DC
  Administered 2022-10-16 – 2022-10-17 (×2): 100 mg via ORAL
  Filled 2022-10-16 (×2): qty 1

## 2022-10-16 MED ORDER — LIDOCAINE HCL (PF) 1 % IJ SOLN
INTRAMUSCULAR | Status: AC
  Filled 2022-10-16: qty 30

## 2022-10-16 MED ORDER — MIDAZOLAM HCL 2 MG/2ML IJ SOLN
INTRAMUSCULAR | Status: AC
  Filled 2022-10-16: qty 2

## 2022-10-16 MED ORDER — HEPARIN (PORCINE) IN NACL 1000-0.9 UT/500ML-% IV SOLN
INTRAVENOUS | Status: DC | PRN
  Administered 2022-10-16 (×2): 500 mL

## 2022-10-16 MED ORDER — ONDANSETRON HCL 4 MG/2ML IJ SOLN: 4.0000 mg | Freq: Four times a day (QID) | INTRAMUSCULAR | Status: DC | PRN

## 2022-10-16 MED ORDER — IOHEXOL 350 MG/ML SOLN
INTRAVENOUS | Status: DC | PRN
  Administered 2022-10-16: 90 mL

## 2022-10-16 MED ORDER — ASPIRIN 81 MG PO TBEC
81.0000 mg | DELAYED_RELEASE_TABLET | Freq: Every day | ORAL | Status: DC
  Administered 2022-10-17: 81 mg via ORAL
  Filled 2022-10-16: qty 1

## 2022-10-16 MED ORDER — PREDNISONE 5 MG PO TABS
5.0000 mg | ORAL_TABLET | Freq: Every day | ORAL | Status: DC
  Administered 2022-10-16: 5 mg via ORAL
  Filled 2022-10-16: qty 1

## 2022-10-16 MED ORDER — SODIUM CHLORIDE 0.9 % IV SOLN: 250.0000 mL | INTRAVENOUS | Status: DC | PRN

## 2022-10-16 MED ORDER — ACETAMINOPHEN 325 MG PO TABS: 650.0000 mg | ORAL_TABLET | ORAL | Status: DC | PRN

## 2022-10-16 MED ORDER — SODIUM CHLORIDE 0.9% FLUSH: 3.0000 mL | INTRAVENOUS | Status: DC | PRN

## 2022-10-16 MED ORDER — VERAPAMIL HCL 2.5 MG/ML IV SOLN
INTRAVENOUS | Status: AC
  Filled 2022-10-16: qty 2

## 2022-10-16 MED ORDER — METOPROLOL TARTRATE 12.5 MG HALF TABLET
12.5000 mg | ORAL_TABLET | Freq: Two times a day (BID) | ORAL | Status: DC
  Administered 2022-10-16 – 2022-10-17 (×3): 12.5 mg via ORAL
  Filled 2022-10-16 (×4): qty 1

## 2022-10-16 MED ORDER — ZINC SULFATE 220 (50 ZN) MG PO CAPS
220.0000 mg | ORAL_CAPSULE | Freq: Every day | ORAL | Status: DC
  Administered 2022-10-16 – 2022-10-17 (×2): 220 mg via ORAL
  Filled 2022-10-16 (×2): qty 1

## 2022-10-16 MED ORDER — FENTANYL CITRATE (PF) 100 MCG/2ML IJ SOLN
INTRAMUSCULAR | Status: DC | PRN
  Administered 2022-10-16 (×3): 25 ug via INTRAVENOUS

## 2022-10-16 MED ORDER — ACETAMINOPHEN 325 MG PO TABS
650.0000 mg | ORAL_TABLET | ORAL | Status: DC | PRN
  Administered 2022-10-16: 650 mg via ORAL
  Filled 2022-10-16: qty 2

## 2022-10-16 MED ORDER — VITAMIN C 500 MG PO TABS
500.0000 mg | ORAL_TABLET | Freq: Every day | ORAL | Status: DC
  Administered 2022-10-16 – 2022-10-17 (×2): 500 mg via ORAL
  Filled 2022-10-16 (×2): qty 1

## 2022-10-16 MED ORDER — SODIUM CHLORIDE 0.9% FLUSH
3.0000 mL | Freq: Two times a day (BID) | INTRAVENOUS | Status: DC
  Administered 2022-10-17: 3 mL via INTRAVENOUS

## 2022-10-16 MED ORDER — SODIUM CHLORIDE 0.9% FLUSH: 3.0000 mL | Freq: Two times a day (BID) | INTRAVENOUS | Status: DC

## 2022-10-16 MED ORDER — PERFLUTREN LIPID MICROSPHERE
1.0000 mL | INTRAVENOUS | Status: AC | PRN
  Administered 2022-10-16: 2 mL via INTRAVENOUS

## 2022-10-16 MED ORDER — SODIUM CHLORIDE 0.9 % WEIGHT BASED INFUSION: 1.0000 mL/kg/h | INTRAVENOUS | Status: DC

## 2022-10-16 MED ORDER — LISINOPRIL 5 MG PO TABS
5.0000 mg | ORAL_TABLET | Freq: Every day | ORAL | Status: DC
  Administered 2022-10-16 – 2022-10-17 (×2): 5 mg via ORAL
  Filled 2022-10-16 (×2): qty 1

## 2022-10-16 MED ORDER — FENTANYL CITRATE (PF) 100 MCG/2ML IJ SOLN
INTRAMUSCULAR | Status: AC
  Filled 2022-10-16: qty 2

## 2022-10-16 MED ORDER — HYDRALAZINE HCL 20 MG/ML IJ SOLN: 10.0000 mg | INTRAMUSCULAR | Status: AC | PRN

## 2022-10-16 MED ORDER — LIDOCAINE HCL (PF) 1 % IJ SOLN
INTRAMUSCULAR | Status: DC | PRN
  Administered 2022-10-16: 15 mL

## 2022-10-16 MED ORDER — HEPARIN (PORCINE) IN NACL 1000-0.9 UT/500ML-% IV SOLN
INTRAVENOUS | Status: AC
  Filled 2022-10-16: qty 1000

## 2022-10-16 MED ORDER — ICOSAPENT ETHYL 1 G PO CAPS
2.0000 g | ORAL_CAPSULE | Freq: Two times a day (BID) | ORAL | Status: DC
  Administered 2022-10-16 – 2022-10-17 (×3): 2 g via ORAL
  Filled 2022-10-16 (×5): qty 2

## 2022-10-16 MED ORDER — HEPARIN SODIUM (PORCINE) 1000 UNIT/ML IJ SOLN
INTRAMUSCULAR | Status: AC
  Filled 2022-10-16: qty 10

## 2022-10-16 MED ORDER — SODIUM CHLORIDE 0.9 % WEIGHT BASED INFUSION
3.0000 mL/kg/h | INTRAVENOUS | Status: DC
  Administered 2022-10-16: 3 mL/kg/h via INTRAVENOUS

## 2022-10-16 MED ORDER — MIDAZOLAM HCL 2 MG/2ML IJ SOLN
INTRAMUSCULAR | Status: DC | PRN
  Administered 2022-10-16 (×3): 1 mg via INTRAVENOUS

## 2022-10-16 MED ORDER — TICAGRELOR 90 MG PO TABS
180.0000 mg | ORAL_TABLET | Freq: Once | ORAL | Status: AC
  Administered 2022-10-16: 180 mg via ORAL
  Filled 2022-10-16: qty 2

## 2022-10-16 MED ORDER — FAMOTIDINE 20 MG PO TABS
20.0000 mg | ORAL_TABLET | Freq: Every day | ORAL | Status: DC
  Administered 2022-10-16 – 2022-10-17 (×2): 20 mg via ORAL
  Filled 2022-10-16 (×2): qty 1

## 2022-10-16 MED ORDER — HEPARIN SODIUM (PORCINE) 1000 UNIT/ML IJ SOLN
INTRAMUSCULAR | Status: DC | PRN
  Administered 2022-10-16: 2000 [IU] via INTRAVENOUS

## 2022-10-16 MED ORDER — NITROGLYCERIN 0.4 MG SL SUBL: 0.4000 mg | SUBLINGUAL_TABLET | SUBLINGUAL | Status: DC | PRN

## 2022-10-16 MED ORDER — SODIUM CHLORIDE 0.9 % IV SOLN: INTRAVENOUS | Status: AC

## 2022-10-16 MED ORDER — LABETALOL HCL 5 MG/ML IV SOLN: 10.0000 mg | INTRAVENOUS | Status: AC | PRN

## 2022-10-16 MED ORDER — ATORVASTATIN CALCIUM 80 MG PO TABS
80.0000 mg | ORAL_TABLET | Freq: Every day | ORAL | Status: DC
  Filled 2022-10-16: qty 2
  Filled 2022-10-16: qty 1

## 2022-10-16 SURGICAL SUPPLY — 13 items
CATH INFINITI 6F 1M (CATHETERS) IMPLANT
CATH INFINITI 6F ANG MULTIPACK (CATHETERS) IMPLANT
CATH INFINITI JR4 5F (CATHETERS) IMPLANT
CLOSURE PERCLOSE PROSTYLE (VASCULAR PRODUCTS) IMPLANT
KIT HEART LEFT (KITS) ×1 IMPLANT
PACK CARDIAC CATHETERIZATION (CUSTOM PROCEDURE TRAY) ×1 IMPLANT
SHEATH PINNACLE 6F 10CM (SHEATH) IMPLANT
SHEATH PROBE COVER 6X72 (BAG) IMPLANT
TRANSDUCER W/STOPCOCK (MISCELLANEOUS) ×1 IMPLANT
TUBING CIL FLEX 10 FLL-RA (TUBING) ×1 IMPLANT
WIRE EMERALD 3MM-J .035X150CM (WIRE) IMPLANT
WIRE EMERALD 3MM-J .035X260CM (WIRE) IMPLANT
WIRE MICRO SET SILHO 5FR 7 (SHEATH) IMPLANT
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
## 2022-10-16 NOTE — H&P (View-Only) (Signed)
Interventional cardiology note: This morning's H&P is reviewed.  The patient is independently interviewed and examined this morning.  His wife, who works as an RN here, is at the bedside.  The patient is chest pain-free.  He is approaching 2 years out from four-vessel CABG with a LIMA to LAD, pedicled RIMA to PDA, left radial sequenced to the first and second OM branches.  He has done well until developing chest pain yesterday.  There are no acute EKG changes but high-sensitivity troponin is elevated with an initial troponin of 225 and a follow-up troponin of 515.  The patient is chest pain-free on IV heparin.  Since the patient is post a-CABG, I am going to load him with ticagrelor 180 mg now.  He will need to be done from femoral access with bilateral IMA grafting and left radial harvesting. I have reviewed the risks, indications, and alternatives to cardiac catheterization, possible angioplasty, and stenting with the patient. Risks include but are not limited to bleeding, infection, vascular injury, stroke, myocardial infection, arrhythmia, kidney injury, radiation-related injury in the case of prolonged fluoroscopy use, emergency cardiac surgery, and death. The patient understands the risks of serious complication is 1-2 in 1000 with diagnostic cardiac cath and 1-2% or less with angioplasty/stenting.  If recurrent chest pain at rest he will need to be taken to the Cath Lab emergently, otherwise he will be done later today when the schedule permits.  Rodney Strickland 10/16/2022 10:31 AM  

## 2022-10-16 NOTE — Progress Notes (Signed)
  Echocardiogram 2D Echocardiogram has been performed.  Maren Reamer 10/16/2022, 4:23 PM

## 2022-10-16 NOTE — Interval H&P Note (Signed)
History and Physical Interval Note:  10/16/2022 3:12 PM  Rodney Strickland  has presented today for surgery, with the diagnosis of nstemi.  The various methods of treatment have been discussed with the patient and family. After consideration of risks, benefits and other options for treatment, the patient has consented to  Procedure(s): LEFT HEART CATH AND CORS/GRAFTS ANGIOGRAPHY (N/A) as a surgical intervention.  The patient's history has been reviewed, patient examined, no change in status, stable for surgery.  I have reviewed the patient's chart and labs.  Questions were answered to the patient's satisfaction.    Cath Lab Visit (complete for each Cath Lab visit)  Clinical Evaluation Leading to the Procedure:   ACS: Yes.    Non-ACS:    Anginal Classification: CCS IV  Anti-ischemic medical therapy: Maximal Therapy (2 or more classes of medications)  Non-Invasive Test Results: No non-invasive testing performed  Prior CABG: Previous CABG        Orbie Pyo
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
## 2022-10-16 NOTE — Progress Notes (Signed)
Interventional cardiology note: This morning's H&P is reviewed.  The patient is independently interviewed and examined this morning.  His wife, who works as an Charity fundraiser here, is at the bedside.  The patient is chest pain-free.  He is approaching 2 years out from four-vessel CABG with a LIMA to LAD, pedicled RIMA to PDA, left radial sequenced to the first and second OM branches.  He has done well until developing chest pain yesterday.  There are no acute EKG changes but high-sensitivity troponin is elevated with an initial troponin of 225 and a follow-up troponin of 515.  The patient is chest pain-free on IV heparin.  Since the patient is post a-CABG, I am going to load him with ticagrelor 180 mg now.  He will need to be done from femoral access with bilateral IMA grafting and left radial harvesting. I have reviewed the risks, indications, and alternatives to cardiac catheterization, possible angioplasty, and stenting with the patient. Risks include but are not limited to bleeding, infection, vascular injury, stroke, myocardial infection, arrhythmia, kidney injury, radiation-related injury in the case of prolonged fluoroscopy use, emergency cardiac surgery, and death. The patient understands the risks of serious complication is 1-2 in 1000 with diagnostic cardiac cath and 1-2% or less with angioplasty/stenting.  If recurrent chest pain at rest he will need to be taken to the Cath Lab emergently, otherwise he will be done later today when the schedule permits.  Rodney Strickland 10/16/2022 10:31 AM

## 2022-10-16 NOTE — Progress Notes (Signed)
CCMD informed that pt had 1 missed beat and pause of 2.63 seconds.

## 2022-10-17 ENCOUNTER — Encounter: Payer: Self-pay | Admitting: Cardiology

## 2022-10-17 ENCOUNTER — Encounter (HOSPITAL_COMMUNITY): Payer: Self-pay | Admitting: Internal Medicine

## 2022-10-17 LAB — TACROLIMUS LEVEL: Tacrolimus (FK506) - LabCorp: 5.1 ng/mL (ref 2.0–20.0)

## 2022-10-17 MED ORDER — CLOPIDOGREL BISULFATE 75 MG PO TABS
75.0000 mg | ORAL_TABLET | Freq: Every day | ORAL | Status: DC
  Administered 2022-10-17: 75 mg via ORAL
  Filled 2022-10-17: qty 1

## 2022-10-17 MED ORDER — LIDOCAINE-EPINEPHRINE 1 %-1:100000 IJ SOLN
10.0000 mL | Freq: Once | INTRAMUSCULAR | Status: AC
  Administered 2022-10-17: 10 mL
  Filled 2022-10-17 (×2): qty 10

## 2022-10-17 MED ORDER — ISOSORBIDE MONONITRATE ER 30 MG PO TB24: 15.0000 mg | ORAL_TABLET | Freq: Every day | ORAL | 1 refills | Status: DC

## 2022-10-17 MED ORDER — PREDNISONE 5 MG PO TABS: 5.0000 mg | ORAL_TABLET | Freq: Every day | ORAL | Status: DC

## 2022-10-17 MED ORDER — PREDNISONE 5 MG PO TABS
7.5000 mg | ORAL_TABLET | Freq: Every day | ORAL | Status: DC
  Administered 2022-10-17: 5 mg via ORAL
  Filled 2022-10-17: qty 2

## 2022-10-17 MED ORDER — ICOSAPENT ETHYL 1 G PO CAPS
2.0000 g | ORAL_CAPSULE | Freq: Two times a day (BID) | ORAL | Status: DC
  Filled 2022-10-17: qty 2

## 2022-10-17 MED ORDER — TACROLIMUS 1 MG PO CAPS
2.0000 mg | ORAL_CAPSULE | Freq: Two times a day (BID) | ORAL | Status: DC
  Filled 2022-10-17 (×2): qty 2

## 2022-10-17 MED ORDER — CLOPIDOGREL BISULFATE 75 MG PO TABS: 75.0000 mg | ORAL_TABLET | Freq: Every day | ORAL | 1 refills | Status: DC

## 2022-10-17 MED ORDER — NITROGLYCERIN 0.4 MG SL SUBL: 0.4000 mg | SUBLINGUAL_TABLET | SUBLINGUAL | 1 refills | Status: DC | PRN

## 2022-10-17 MED ORDER — ISOSORBIDE MONONITRATE ER 30 MG PO TB24
15.0000 mg | ORAL_TABLET | Freq: Every day | ORAL | Status: DC
  Administered 2022-10-17: 15 mg via ORAL
  Filled 2022-10-17: qty 1

## 2022-10-17 NOTE — Plan of Care (Signed)
Problem: Education: Goal: Understanding of cardiac disease, CV risk reduction, and recovery process will improve Outcome: Adequate for Discharge Goal: Individualized Educational Video(s) Outcome: Adequate for Discharge   Problem: Activity: Goal: Ability to tolerate increased activity will improve 10/17/2022 1246 by Amador Cunas, RN Outcome: Adequate for Discharge 10/17/2022 1055 by Amador Cunas, RN Outcome: Progressing   Problem: Cardiac: Goal: Ability to achieve and maintain adequate cardiovascular perfusion will improve 10/17/2022 1246 by Amador Cunas, RN Outcome: Adequate for Discharge 10/17/2022 1055 by Amador Cunas, RN Outcome: Progressing   Problem: Health Behavior/Discharge Planning: Goal: Ability to safely manage health-related needs after discharge will improve 10/17/2022 1246 by Amador Cunas, RN Outcome: Adequate for Discharge 10/17/2022 1055 by Amador Cunas, RN Outcome: Progressing   Problem: Education: Goal: Understanding of CV disease, CV risk reduction, and recovery process will improve Outcome: Adequate for Discharge Goal: Individualized Educational Video(s) Outcome: Adequate for Discharge   Problem: Activity: Goal: Ability to return to baseline activity level will improve 10/17/2022 1246 by Amador Cunas, RN Outcome: Adequate for Discharge 10/17/2022 1055 by Amador Cunas, RN Outcome: Progressing   Problem: Cardiovascular: Goal: Ability to achieve and maintain adequate cardiovascular perfusion will improve 10/17/2022 1246 by Amador Cunas, RN Outcome: Adequate for Discharge 10/17/2022 1055 by Amador Cunas, RN Outcome: Progressing Goal: Vascular access site(s) Level 0-1 will be maintained 10/17/2022 1246 by Amador Cunas, RN Outcome: Adequate for Discharge 10/17/2022 1055 by Amador Cunas, RN Outcome: Progressing   Problem: Health Behavior/Discharge Planning: Goal: Ability to safely manage health-related needs after  discharge will improve 10/17/2022 1246 by Amador Cunas, RN Outcome: Adequate for Discharge 10/17/2022 1055 by Amador Cunas, RN Outcome: Progressing   Problem: Education: Goal: Knowledge of General Education information will improve Description: Including pain rating scale, medication(s)/side effects and non-pharmacologic comfort measures 10/17/2022 1246 by Amador Cunas, RN Outcome: Adequate for Discharge 10/17/2022 1055 by Amador Cunas, RN Outcome: Progressing   Problem: Health Behavior/Discharge Planning: Goal: Ability to manage health-related needs will improve Outcome: Adequate for Discharge   Problem: Clinical Measurements: Goal: Ability to maintain clinical measurements within normal limits will improve Outcome: Adequate for Discharge Goal: Will remain free from infection Outcome: Adequate for Discharge Goal: Diagnostic test results will improve Outcome: Adequate for Discharge Goal: Respiratory complications will improve Outcome: Adequate for Discharge Goal: Cardiovascular complication will be avoided Outcome: Adequate for Discharge   Problem: Activity: Goal: Risk for activity intolerance will decrease Outcome: Adequate for Discharge   Problem: Nutrition: Goal: Adequate nutrition will be maintained Outcome: Adequate for Discharge   Problem: Coping: Goal: Level of anxiety will decrease Outcome: Adequate for Discharge   Problem: Elimination: Goal: Will not experience complications related to bowel motility Outcome: Adequate for Discharge Goal: Will not experience complications related to urinary retention Outcome: Adequate for Discharge   Problem: Pain Managment: Goal: General experience of comfort will improve Outcome: Adequate for Discharge   Problem: Safety: Goal: Ability to remain free from injury will improve Outcome: Adequate for Discharge   Problem: Skin Integrity: Goal: Risk for impaired skin integrity will decrease Outcome: Adequate  for Discharge   Problem: Education: Goal: Knowledge of cardiac device and self-care will improve Outcome: Adequate for Discharge Goal: Ability to safely manage health related needs after discharge will improve Outcome: Adequate for Discharge Goal: Individualized Educational Video(s) Outcome: Adequate for Discharge   Problem: Cardiac: Goal: Ability to achieve and maintain adequate cardiopulmonary perfusion will improve Outcome: Adequate for Discharge

## 2022-10-17 NOTE — Discharge Summary (Signed)
Discharge Summary    Patient ID: Rodney Strickland MRN: [id number]; DOB: January 08, 1966  Admit date: 10/15/2022 Discharge date: 10/17/2022  PCP:  Gaspar Garbeisovec, Richard W, MD   [facility] Providers Cardiologist:  Kristeen MissPhilip Nahser, MD wants to transition to Dr. Excell Seltzerooper (given Dr. Elease HashimotoNahser is cutting back on time)  Cardiology APP:  Beatrice LecherWeaver, Scott T, PA-C    Discharge Diagnoses    Principal Problem:   NSTEMI (non-ST elevated myocardial infarction) [facility](HCC) Active Problems:   S/P CABG x 4   CAD (coronary artery disease)   Hyperlipidemia   Hypertension   Diagnostic Studies/Procedures    Cath: 10/16/22    Dist LAD-1 lesion is 50% stenosed.   Dist LAD-2 lesion is 90% stenosed.   Ost Cx to Prox Cx lesion is 30% stenosed.   Prox Cx to Mid Cx lesion is 95% stenosed.   Mid Cx lesion is 90% stenosed.   Prox RCA lesion is 85% stenosed.   Mid RCA lesion is 60% stenosed.   2nd Diag lesion is 50% stenosed.   3rd Diag lesion is 90% stenosed.   2nd Mrg lesion is 70% stenosed.   3rd Mrg lesion is 50% stenosed.   RPAV lesion is 100% stenosed.   1.  Patent LIMA to LAD, RIMA to PDA, and radial sequential to OM1 to OM 3. 2.  LVEDP of 11 mmHg.   Recommendation: Medical therapy.  Diagnostic Dominance: Right  Echo: 10/16/22  IMPRESSIONS     1. Left ventricular ejection fraction, by estimation, is 60 to 65%. The  left ventricle has normal function. The left ventricle has no regional  wall motion abnormalities. Left ventricular diastolic parameters are  consistent with Grade I diastolic  dysfunction (impaired relaxation).   2. Right ventricular systolic function is normal. The right ventricular  size is normal. There is normal pulmonary artery systolic pressure. The  estimated right ventricular systolic pressure is 17.7 mmHg.   3. The mitral valve is grossly normal. Trivial mitral valve  regurgitation. No evidence of mitral stenosis.   4. The aortic valve is tricuspid. Aortic valve  regurgitation is trivial.  No aortic stenosis is present.   5. The inferior vena cava is normal in size with greater than 50%  respiratory variability, suggesting right atrial pressure of 3 mmHg.   Comparison(s): No significant change from prior study.   FINDINGS   Left Ventricle: Left ventricular ejection fraction, by estimation, is 60  to 65%. The left ventricle has normal function. The left ventricle has no  regional wall motion abnormalities. Definity contrast agent was given IV  to delineate the left ventricular   endocardial borders. The left ventricular internal cavity size was normal  in size. There is no left ventricular hypertrophy. Left ventricular  diastolic parameters are consistent with Grade I diastolic dysfunction  (impaired relaxation).   Right Ventricle: The right ventricular size is normal. No increase in  right ventricular wall thickness. Right ventricular systolic function is  normal. There is normal pulmonary artery systolic pressure. The tricuspid  regurgitant velocity is 1.92 m/s, and   with an assumed right atrial pressure of 3 mmHg, the estimated right  ventricular systolic pressure is 17.7 mmHg.   Left Atrium: Left atrial size was normal in size.   Right Atrium: Right atrial size was normal in size.   Pericardium: Trivial pericardial effusion is present.   Mitral Valve: The mitral valve is grossly normal. Trivial mitral valve  regurgitation. No evidence of mitral valve stenosis.  Tricuspid Valve: The tricuspid valve is grossly normal. Tricuspid valve  regurgitation is trivial. No evidence of tricuspid stenosis.   Aortic Valve: The aortic valve is tricuspid. Aortic valve regurgitation is  trivial. No aortic stenosis is present.   Pulmonic Valve: The pulmonic valve was grossly normal. Pulmonic valve  regurgitation is not visualized. No evidence of pulmonic stenosis.   Aorta: The aortic root and ascending aorta are structurally normal, with  no  evidence of dilitation.   Venous: The right lower pulmonary vein is normal. The inferior vena cava  is normal in size with greater than 50% respiratory variability,  suggesting right atrial pressure of 3 mmHg.   IAS/Shunts: The atrial septum is grossly normal.  _____________   History of Present Illness     Rodney Strickland is a 56 y.o. 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   He had been doing well since his surgery in terms of chest pain. He suddenly started having CP the afternoon of admission. Pt took OTC meds with no relief. Pain radiated to shoulder blade. Pt took 3 nitro that helped the pain and baby ASA. Was pain free in the ED. No SOB or leg edema. Very active overall- no pain with exertion. Wife is Charity fundraiser. Compliant with medications. In the ED, troponin initially were in 200; repeat in 500s. BMP unremarkable. CXR shows no pulmonary edema. He was admitted for further management.    Hospital Course     NSTEMI CAD s/p CABG -- presented with chest pain, hsTn peaked at 515. Underwent cardiac cath noted above with patent LIMA-LAD, RIMA-PDA and radial seq- OM1/OM3. Suspect progression of native vessel disease. Recommendations for medical therapy with DAPT with ASA/plavix for at least one year -- continue ASA, plavix, metoprolol, lisinopril. Added Imdur 15mg  daily    HTN -- blood pressures overall stable, soft at times -- continue metoprolol 12.5mg  BID, lisinopril 5mg  daily   HLD -- HDL 64, LDL 74, Trig 35 -- on repatha PTA  Proteinuria -- follows with nephrology outpatient -- on prograf and prednisone   General: Well developed, well nourished, male appearing in no acute distress. Head: Normocephalic, atraumatic.  Neck: Supple without bruits, JVD. Lungs:  Resp regular and unlabored, CTA. Heart: RRR, S1, S2, no S3, S4, or murmur; no rub. Abdomen: Soft,  non-tender, non-distended with normoactive bowel sounds. No hepatomegaly. No rebound/guarding. No obvious abdominal masses. Extremities: No clubbing, cyanosis, edema. Distal pedal pulses are 2+ bilaterally. Right femoral cath site stable. Did have oozing overnight and required sq injection with lido/epi to resolve. (40ml). Site stable prior to DC. Neuro: Alert and oriented X 3. Moves all extremities spontaneously. Psych: Normal affect.   Did the patient have an acute coronary syndrome (MI, NSTEMI, STEMI, etc) this admission?:  Yes                               AHA/ACC Clinical Performance & Quality Measures: Aspirin prescribed? - Yes ADP Receptor Inhibitor (Plavix/Clopidogrel, Brilinta/Ticagrelor or Effient/Prasugrel) prescribed (includes medically managed patients)? - Yes Beta Blocker prescribed? - Yes High Intensity Statin (Lipitor 40-80mg  or Crestor 20-40mg ) prescribed? - No - intolerant, on repatha EF assessed during THIS hospitalization? - Yes For EF <40%, was ACEI/ARB prescribed? - Not Applicable (EF >/= 40%) For EF <40%, Aldosterone Antagonist (Spironolactone or Eplerenone)  prescribed? - Not Applicable (EF >/= 40%) Cardiac Rehab Phase II ordered (including medically managed patients)? - Yes     The patient will be scheduled for a TOC follow up appointment in 10-14 days.  A message has been sent to the [facility] and Scheduling Pool at the office where the patient should be seen for follow up.  _____________  Discharge Vitals Blood pressure (!) 146/89, pulse 68, temperature 98 F (36.7 C), temperature source Oral, resp. rate 19, height  (1.676 m), weight 72.6 kg, SpO2 99 %.  Filed Weights   10/15/22 1902  Weight: 72.6 kg    Labs & Radiologic Studies    CBC Recent Labs    10/15/22 1924 10/16/22 0319  WBC 7.3 6.7  NEUTROABS 4.2  --   HGB 14.4 14.4  HCT 42.7 40.0  MCV 92.8 90.7  PLT 224 198   Basic Metabolic Panel Recent Labs    16/10/96 1924 10/16/22 0319  NA  136 139  K 4.0 3.7  CL 105 108  CO2 22 23  GLUCOSE 98 100*  BUN 20 18  CREATININE 1.15 1.07  CALCIUM 9.0 8.8*   Liver Function Tests No results for input(s): "AST", "ALT", "ALKPHOS", "BILITOT", "PROT", "ALBUMIN" in the last 72 hours. No results for input(s): "LIPASE", "AMYLASE" in the last 72 hours. High Sensitivity Troponin:   Recent Labs  Lab 10/15/22 1924 10/15/22 2226  TROPONINIHS 225* 515*    BNP Invalid input(s): "POCBNP" D-Dimer No results for input(s): "DDIMER" in the last 72 hours. Hemoglobin A1C No results for input(s): "HGBA1C" in the last 72 hours. Fasting Lipid Panel Recent Labs    10/16/22 0319  CHOL 145  HDL 64  LDLCALC 74  TRIG 35  CHOLHDL 2.3   Thyroid Function Tests No results for input(s): "TSH", "T4TOTAL", "T3FREE", "THYROIDAB" in the last 72 hours.  Invalid input(s): "FREET3" _____________  CARDIAC CATHETERIZATION  Result Date: 10/16/2022   Dist LAD-1 lesion is 50% stenosed.   Dist LAD-2 lesion is 90% stenosed.   Ost Cx to Prox Cx lesion is 30% stenosed.   Prox Cx to Mid Cx lesion is 95% stenosed.   Mid Cx lesion is 90% stenosed.   Prox RCA lesion is 85% stenosed.   Mid RCA lesion is 60% stenosed.   2nd Diag lesion is 50% stenosed.   3rd Diag lesion is 90% stenosed.   2nd Mrg lesion is 70% stenosed.   3rd Mrg lesion is 50% stenosed.   RPAV lesion is 100% stenosed. 1.  Patent LIMA to LAD, RIMA to PDA, and radial sequential to OM1 to OM 3. 2.  LVEDP of 11 mmHg. Recommendation: Medical therapy.   ECHOCARDIOGRAM COMPLETE  Result Date: 10/16/2022    ECHOCARDIOGRAM REPORT   Patient Name:   Rodney Strickland Date of Exam: 10/16/2022 Medical Rec #:  [id number]        Height:       66.0 in Accession #:    [id number]       Weight:       160.0 lb Date of Birth:  August 15, 1966        BSA:          1.819 m Patient Age:    56 years         BP:           113/85 mmHg Patient Gender: M                HR:  59 bpm. Exam Location:  Inpatient Procedure: 2D Echo,  Cardiac Doppler, Color Doppler and Intracardiac            Opacification Agent Indications:    Abnormal ECG R94.31  History:        Patient has prior history of Echocardiogram examinations, most                 recent 01/17/2021. CAD and NSTEMI, Prior CABG and Abnormal ECG,                 Signs/Symptoms:Chest Pain; Risk Factors:Dyslipidemia,                 Hypertension and Non-Smoker.  Sonographer:    Aron Baba Referring Phys: [id number] [facility] S KHAN  Sonographer Comments: Suboptimal subcostal window. Image acquisition challenging due to respiratory motion. IMPRESSIONS  1. Left ventricular ejection fraction, by estimation, is 60 to 65%. The left ventricle has normal function. The left ventricle has no regional wall motion abnormalities. Left ventricular diastolic parameters are consistent with Grade I diastolic dysfunction (impaired relaxation).  2. Right ventricular systolic function is normal. The right ventricular size is normal. There is normal pulmonary artery systolic pressure. The estimated right ventricular systolic pressure is 17.7 mmHg.  3. The mitral valve is grossly normal. Trivial mitral valve regurgitation. No evidence of mitral stenosis.  4. The aortic valve is tricuspid. Aortic valve regurgitation is trivial. No aortic stenosis is present.  5. The inferior vena cava is normal in size with greater than 50% respiratory variability, suggesting right atrial pressure of 3 mmHg. Comparison(s): No significant change from prior study. FINDINGS  Left Ventricle: Left ventricular ejection fraction, by estimation, is 60 to 65%. The left ventricle has normal function. The left ventricle has no regional wall motion abnormalities. Definity contrast agent was given IV to delineate the left ventricular  endocardial borders. The left ventricular internal cavity size was normal in size. There is no left ventricular hypertrophy. Left ventricular diastolic parameters are consistent with Grade I diastolic dysfunction  (impaired relaxation). Right Ventricle: The right ventricular size is normal. No increase in right ventricular wall thickness. Right ventricular systolic function is normal. There is normal pulmonary artery systolic pressure. The tricuspid regurgitant velocity is 1.92 m/s, and  with an assumed right atrial pressure of 3 mmHg, the estimated right ventricular systolic pressure is 17.7 mmHg. Left Atrium: Left atrial size was normal in size. Right Atrium: Right atrial size was normal in size. Pericardium: Trivial pericardial effusion is present. Mitral Valve: The mitral valve is grossly normal. Trivial mitral valve regurgitation. No evidence of mitral valve stenosis. Tricuspid Valve: The tricuspid valve is grossly normal. Tricuspid valve regurgitation is trivial. No evidence of tricuspid stenosis. Aortic Valve: The aortic valve is tricuspid. Aortic valve regurgitation is trivial. No aortic stenosis is present. Pulmonic Valve: The pulmonic valve was grossly normal. Pulmonic valve regurgitation is not visualized. No evidence of pulmonic stenosis. Aorta: The aortic root and ascending aorta are structurally normal, with no evidence of dilitation. Venous: The right lower pulmonary vein is normal. The inferior vena cava is normal in size with greater than 50% respiratory variability, suggesting right atrial pressure of 3 mmHg. IAS/Shunts: The atrial septum is grossly normal.  LEFT VENTRICLE PLAX 2D LVIDd:         4.10 cm   Diastology LVIDs:         2.60 cm   LV e' medial:    4.57 cm/s LV PW:  1.20 cm   LV E/e' medial:  17.2 LV IVS:        0.90 cm   LV e' lateral:   6.96 cm/s LVOT diam:     1.80 cm   LV E/e' lateral: 11.3 LV SV:         56 LV SV Index:   31 LVOT Area:     2.54 cm  RIGHT VENTRICLE RV S prime:     10.40 cm/s TAPSE (M-mode): 2.1 cm LEFT ATRIUM             Index        RIGHT ATRIUM           Index LA diam:        3.20 cm 1.76 cm/m   RA Area:     11.40 cm LA Vol (A2C):   41.7 ml 22.93 ml/m  RA Volume:    24.00 ml  13.19 ml/m LA Vol (A4C):   33.3 ml 18.31 ml/m LA Biplane Vol: 40.5 ml 22.27 ml/m  AORTIC VALVE LVOT Vmax:   91.80 cm/s LVOT Vmean:  70.900 cm/s LVOT VTI:    0.222 m  AORTA Ao Root diam: 3.30 cm Ao Asc diam:  3.70 cm MITRAL VALVE               TRICUSPID VALVE MV Area (PHT): 3.10 cm    TR Peak grad:   14.7 mmHg MV Decel Time: 245 msec    TR Vmax:        192.00 cm/s MR Peak grad: 4.5 mmHg MR Vmax:      106.00 cm/s  SHUNTS MV E velocity: 78.70 cm/s  Systemic VTI:  0.22 m MV A velocity: 88.30 cm/s  Systemic Diam: 1.80 cm MV E/A ratio:  0.89 Lennie Odor MD Electronically signed by Lennie Odor MD Signature Date/Time: 10/16/2022/4:51:01 PM    Final    DG Chest 2 View  Result Date: 10/15/2022 CLINICAL DATA:  Left-sided chest pain. EXAM: CHEST - 2 VIEW COMPARISON:  02/16/2021. FINDINGS: The heart size and mediastinal contours are within normal limits. No consolidation, effusion, or pneumothorax. Sternotomy wires are present over the midline. Surgical clips are identified in the right upper quadrant. No acute osseous abnormality. IMPRESSION: No active cardiopulmonary disease. Electronically Signed   By: Thornell Sartorius M.D.   On: 10/15/2022 20:47   Disposition   Pt is being discharged home today in good condition.  Follow-up Plans & Appointments     Follow-up Information     Louanne Skye Devoria Albe., NP Follow up on 10/26/2022.   Specialty: Cardiology Why: at 2:20pm for your follow up appt with Dr. Randolm Idol' NP Lawerance Sabal information: [address] [place] [address] [phone]                Discharge Instructions     Call MD for:  redness, tenderness, or signs of infection (pain, swelling, redness, odor or green/yellow discharge around incision site)   Complete by: As directed    Diet - low sodium heart healthy   Complete by: As directed    Discharge instructions   Complete by: As directed    Groin Site Care Refer to this sheet in the next few  weeks. These instructions provide you with information on caring for yourself after your procedure. Your caregiver may also give you more specific instructions. Your treatment has been planned according to current medical practices, but problems sometimes occur. Call your caregiver if you  have any problems or questions after your procedure. HOME CARE INSTRUCTIONS You may shower 24 hours after the procedure. Remove the bandage (dressing) and gently wash the site with plain soap and water. Gently pat the site dry.  Do not apply powder or lotion to the site.  Do not sit in a bathtub, swimming pool, or whirlpool for 5 to 7 days.  No bending, squatting, or lifting anything over 10 pounds (4.5 kg) as directed by your caregiver.  Inspect the site at least twice daily.  Do not drive home if you are discharged the same day of the procedure. Have someone else drive you.  You may drive 24 hours after the procedure unless otherwise instructed by your caregiver.  What to expect: Any bruising will usually fade within 1 to 2 weeks.  Blood that collects in the tissue (hematoma) may be painful to the touch. It should usually decrease in size and tenderness within 1 to 2 weeks.  SEEK IMMEDIATE MEDICAL CARE IF: You have unusual pain at the groin site or down the affected leg.  You have redness, warmth, swelling, or pain at the groin site.  You have drainage (other than a small amount of blood on the dressing).  You have chills.  You have a fever or persistent symptoms for more than 72 hours.  You have a fever and your symptoms suddenly get worse.  Your leg becomes pale, cool, tingly, or numb.  You have heavy bleeding from the site. Hold pressure on the site. .   Increase activity slowly   Complete by: As directed         Discharge Medications   Allergies as of 10/17/2022       Reactions   Dapsone    Other reaction(s): nausea and vomiting   Statins    Per patient's wife, he cannot have statin drugs         Medication List     TAKE these medications    acetaminophen 325 MG tablet Commonly known as: TYLENOL Take 650 mg by mouth every 6 (six) hours as needed for headache.   allopurinol 100 MG tablet Commonly known as: ZYLOPRIM TAKE 1 TABLET BY MOUTH ONCE DAILY What changed: how much to take   ascorbic acid 500 MG tablet Commonly known as: VITAMIN C Take 500 mg by mouth daily.   aspirin EC 81 MG tablet Take 81 mg by mouth daily. Swallow whole.   clopidogrel 75 MG tablet Commonly known as: PLAVIX Take 1 tablet (75 mg total) by mouth daily.   icosapent Ethyl 1 g capsule Commonly known as: VASCEPA Take 2 capsules (2 g total) by mouth 2 (two) times daily.   isosorbide mononitrate 30 MG 24 hr tablet Commonly known as: IMDUR Take 0.5 tablets (15 mg total) by mouth daily. Start taking on: October 18, 2022   lisinopril 10 MG tablet Commonly known as: ZESTRIL Take 5 mg by mouth daily.   metoprolol tartrate 25 MG tablet Commonly known as: LOPRESSOR TAKE 1 TABLET BY MOUTH TWICE A DAY What changed: how much to take   nitroGLYCERIN 0.4 MG SL tablet Commonly known as: NITROSTAT Place 1 tablet (0.4 mg total) under the tongue every 5 (five) minutes x 3 doses as needed for chest pain.   predniSONE 5 MG tablet Commonly known as: DELTASONE Take 5 mg by mouth daily.   Repatha SureClick 140 MG/ML Soaj Generic drug: Evolocumab INJECT 1 PEN INTO THE SKIN EVERY 14 DAYS. What changed: See the new  instructions.   tacrolimus 1 MG capsule Commonly known as: PROGRAF Take 2 mg by mouth 2 (two) times daily.   zinc gluconate 50 MG tablet Take 50 mg by mouth daily.           Outstanding Labs/Studies   N/a   Duration of Discharge Encounter   Greater than 30 minutes including physician time.  Signed, Laverda Page, NP 10/17/2022, 12:15 PM  Patient seen, examined. Available data reviewed. Agree with findings, assessment, and plan as outlined by Laverda Page, NP.   The patient is independently interviewed and examined.  He is alert, oriented, no distress.  Lungs are clear, heart is regular rate and rhythm no murmur gallop, abdomen soft nontender, right groin site is clear with no hematoma or ecchymosis, lower extremities have no edema.  I reviewed the patient's cardiac catheterization films which demonstrate widely patent arterial bypass grafts.  The patient has diffuse small vessel coronary artery disease but no clear culprit lesion for non-STEMI.  Isosorbide has been added to his medical regimen at low-dose.  Would also add clopidogrel 75 mg daily for 1 year of treatment for medically managed non-STEMI.  The patient is medically stable for discharge today.  He requests to follow-up with me in the outpatient setting which will be arranged.  Tonny Bollman, M.D. 10/17/2022 12:15 PM

## 2022-10-17 NOTE — Plan of Care (Signed)
  Problem: Activity: Goal: Ability to tolerate increased activity will improve Outcome: Progressing   Problem: Cardiac: Goal: Ability to achieve and maintain adequate cardiovascular perfusion will improve Outcome: Progressing   Problem: Health Behavior/Discharge Planning: Goal: Ability to safely manage health-related needs after discharge will improve Outcome: Progressing   Problem: Activity: Goal: Ability to return to baseline activity level will improve Outcome: Progressing   Problem: Cardiovascular: Goal: Ability to achieve and maintain adequate cardiovascular perfusion will improve Outcome: Progressing Goal: Vascular access site(s) Level 0-1 will be maintained Outcome: Progressing   Problem: Health Behavior/Discharge Planning: Goal: Ability to safely manage health-related needs after discharge will improve Outcome: Progressing   Problem: Education: Goal: Knowledge of General Education information will improve Description: Including pain rating scale, medication(s)/side effects and non-pharmacologic comfort measures Outcome: Progressing

## 2022-10-17 NOTE — Progress Notes (Signed)
Right femoral site was oozing and dressing was soaked. New pressure dressing was applied. Will check in an hour to see if oozing has stopped. Pt comfortable and in no distress

## 2022-10-17 NOTE — Progress Notes (Signed)
Right femoral site with small continuous trickle of blood. Dressing changed. Spoke to [place] in cath lab and will send someone up to assess. Wife and patient aware of plan

## 2022-10-17 NOTE — Progress Notes (Signed)
Patient discharging to home.  Wife at bedside.  Discharge instructions given.  Patient and spouse verbalized understanding.

## 2022-10-17 NOTE — Progress Notes (Signed)
  10/17/2022  Patient:  ANDRIY SHERK Date of Birth:  May 18, 1966 Date of Admission:  10/15/22  To Whom it May Concern:  Please excuse AYOMIDE PURDY from work until 10/16/2022. He/She may return 10/16/2022.   If you require additional information, please contact the office below to obtain written authorization to release protected health information.  [employer], building services do not permit release of information without signed authorization.  ADE STMARIE may stop by the office or hospital to complete an Authorization to Release Protected Health Information.  Thank you for your cooperation in this regard.  If you have any questions or concerns, please don't hesitate to call.  Sincerely,    Laverda Page, NP [facility] [address] [place], [place] [address] [phone]

## 2022-10-18 LAB — LIPOPROTEIN A (LPA): Lipoprotein (a): 36.5 nmol/L — ABNORMAL HIGH (ref <75.0)

## 2022-10-26 ENCOUNTER — Ambulatory Visit: Payer: Federal, State, Local not specified - PPO | Admitting: Nurse Practitioner
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
## 2022-10-29 ENCOUNTER — Other Ambulatory Visit (HOSPITAL_COMMUNITY): Payer: Self-pay

## 2022-10-29 ENCOUNTER — Encounter: Payer: Self-pay | Admitting: Nurse Practitioner

## 2022-10-29 ENCOUNTER — Ambulatory Visit: Payer: Federal, State, Local not specified - PPO | Attending: Nurse Practitioner | Admitting: Nurse Practitioner

## 2022-10-29 VITALS — BP 110/72 | HR 71 | Ht 66.0 in | Wt 164.8 lb

## 2022-10-29 DIAGNOSIS — Z951 Presence of aortocoronary bypass graft: Secondary | ICD-10-CM

## 2022-10-29 DIAGNOSIS — I2510 Atherosclerotic heart disease of native coronary artery without angina pectoris: Secondary | ICD-10-CM

## 2022-10-29 DIAGNOSIS — E785 Hyperlipidemia, unspecified: Secondary | ICD-10-CM | POA: Diagnosis not present

## 2022-10-29 DIAGNOSIS — I10 Essential (primary) hypertension: Secondary | ICD-10-CM

## 2022-10-29 MED ORDER — PANTOPRAZOLE SODIUM 40 MG PO TBEC
40.0000 mg | DELAYED_RELEASE_TABLET | Freq: Every day | ORAL | 0 refills | Status: DC
  Filled 2022-10-29: qty 90, 90d supply, fill #0
  Filled 2023-03-03: qty 30, 30d supply, fill #1

## 2022-10-29 NOTE — Patient Instructions (Signed)
Medication Instructions:   START Protonix one (1) tablet by mouth ( 40 mg ) daily X 3 months than gradually wean off medication.   *If you need a refill on your cardiac medications before your next appointment, please call your pharmacy*   Lab Work:  Your physician recommends that you return for a FASTING lipid profile/cmet on Wednesday, April 17. You can come in on the day of your appointment anytime between 7:30-4:30 fasting from midnight the night before.     If you have labs (blood work) drawn today and your tests are completely normal, you will receive your results only by: MyChart Message (if you have MyChart) OR A paper copy in the mail If you have any lab test that is abnormal or we need to change your treatment, we will call you to review the results.   Testing/Procedures:  None ordered.   Follow-Up: At [facility], you and your health needs are our priority.  As part of our continuing mission to provide you with exceptional heart care, we have created designated Provider Care Teams.  These Care Teams include your primary Cardiologist (physician) and Advanced Practice Providers (APPs -  Physician Assistants and Nurse Practitioners) who all work together to provide you with the care you need, when you need it.  We recommend signing up for the patient portal called "MyChart".  Sign up information is provided on this After Visit Summary.  MyChart is used to connect with patients for Virtual Visits (Telemedicine).  Patients are able to view lab/test results, encounter notes, upcoming appointments, etc.  Non-urgent messages can be sent to your provider as well.   To learn more about what you can do with MyChart, go to [email or web address].    Your next appointment:   4 month(s)  The format for your next appointment:   In Person  Provider:   Eligha Bridegroom, NP     Then, Tonny Bollman, MD  will plan to see you again in 9 month(s).    Important Information  About Sugar

## 2022-11-16 DIAGNOSIS — N179 Acute kidney failure, unspecified: Secondary | ICD-10-CM | POA: Diagnosis not present

## 2022-12-03 DIAGNOSIS — I129 Hypertensive chronic kidney disease with stage 1 through stage 4 chronic kidney disease, or unspecified chronic kidney disease: Secondary | ICD-10-CM | POA: Diagnosis not present

## 2022-12-03 DIAGNOSIS — N049 Nephrotic syndrome with unspecified morphologic changes: Secondary | ICD-10-CM | POA: Diagnosis not present

## 2022-12-03 DIAGNOSIS — I209 Angina pectoris, unspecified: Secondary | ICD-10-CM | POA: Diagnosis not present

## 2022-12-03 DIAGNOSIS — N1831 Chronic kidney disease, stage 3a: Secondary | ICD-10-CM | POA: Diagnosis not present

## 2022-12-07 ENCOUNTER — Other Ambulatory Visit: Payer: Self-pay | Admitting: Cardiovascular Disease

## 2022-12-07 DIAGNOSIS — E782 Mixed hyperlipidemia: Secondary | ICD-10-CM

## 2022-12-07 DIAGNOSIS — I200 Unstable angina: Secondary | ICD-10-CM

## 2022-12-07 DIAGNOSIS — I2510 Atherosclerotic heart disease of native coronary artery without angina pectoris: Secondary | ICD-10-CM

## 2022-12-09 ENCOUNTER — Other Ambulatory Visit: Payer: Self-pay | Admitting: Cardiovascular Disease

## 2022-12-11 DIAGNOSIS — N049 Nephrotic syndrome with unspecified morphologic changes: Secondary | ICD-10-CM | POA: Diagnosis not present

## 2023-01-03 DIAGNOSIS — N050 Unspecified nephritic syndrome with minor glomerular abnormality: Secondary | ICD-10-CM | POA: Diagnosis not present

## 2023-01-03 DIAGNOSIS — N179 Acute kidney failure, unspecified: Secondary | ICD-10-CM | POA: Diagnosis not present

## 2023-01-03 DIAGNOSIS — N049 Nephrotic syndrome with unspecified morphologic changes: Secondary | ICD-10-CM | POA: Diagnosis not present

## 2023-01-07 DIAGNOSIS — E785 Hyperlipidemia, unspecified: Secondary | ICD-10-CM | POA: Diagnosis not present

## 2023-01-07 DIAGNOSIS — I10 Essential (primary) hypertension: Secondary | ICD-10-CM | POA: Diagnosis not present

## 2023-01-07 DIAGNOSIS — I2510 Atherosclerotic heart disease of native coronary artery without angina pectoris: Secondary | ICD-10-CM | POA: Diagnosis not present

## 2023-01-07 DIAGNOSIS — N050 Unspecified nephritic syndrome with minor glomerular abnormality: Secondary | ICD-10-CM | POA: Diagnosis not present

## 2023-01-10 DIAGNOSIS — N050 Unspecified nephritic syndrome with minor glomerular abnormality: Secondary | ICD-10-CM | POA: Diagnosis not present

## 2023-01-16 DIAGNOSIS — N179 Acute kidney failure, unspecified: Secondary | ICD-10-CM | POA: Diagnosis not present

## 2023-01-19 ENCOUNTER — Other Ambulatory Visit: Payer: Self-pay | Admitting: Cardiology

## 2023-02-25 ENCOUNTER — Telehealth: Payer: Self-pay | Admitting: Pharmacist

## 2023-02-25 DIAGNOSIS — E782 Mixed hyperlipidemia: Secondary | ICD-10-CM

## 2023-02-25 DIAGNOSIS — I200 Unstable angina: Secondary | ICD-10-CM

## 2023-02-25 DIAGNOSIS — I2510 Atherosclerotic heart disease of native coronary artery without angina pectoris: Secondary | ICD-10-CM

## 2023-02-25 MED ORDER — REPATHA SURECLICK 140 MG/ML ~~LOC~~ SOAJ: 140.0000 mg | SUBCUTANEOUS | 0 refills | Status: DC

## 2023-02-25 NOTE — Telephone Encounter (Signed)
Patient's wife called stating that the pharmacy changed the Repatha Rx to 21ml/56days and that they have been charged a 90 DS copay for only 60 DS. Original Rx was written for 54ml. She has spoken with the pharmacy who told her that her insurance said they wouldn't fill more than 55ml, but then spoke with her insurance who said that if DS is >30 a 90 DS copay will be charged. Per insurance we should sent an Rx for 71ml /30 DS. Wife explained that its given q 14 days and that the DS for 70ml is 56. Insurance states to have pharmacy run rx like that and have them call her if there is a problem. I have sent Rx with that note. Also reminded pt wife to call [facility] for copay card

## 2023-02-27 ENCOUNTER — Ambulatory Visit: Payer: Federal, State, Local not specified - PPO | Attending: Internal Medicine

## 2023-02-27 DIAGNOSIS — N179 Acute kidney failure, unspecified: Secondary | ICD-10-CM | POA: Diagnosis not present

## 2023-03-03 ENCOUNTER — Other Ambulatory Visit (HOSPITAL_COMMUNITY): Payer: Self-pay

## 2023-03-04 ENCOUNTER — Telehealth: Payer: Self-pay | Admitting: Cardiovascular Disease

## 2023-03-04 ENCOUNTER — Other Ambulatory Visit (HOSPITAL_COMMUNITY): Payer: Self-pay

## 2023-03-04 NOTE — Addendum Note (Signed)
Addended by: Malena Peer D on: 03/04/2023 12:23 PM   Modules accepted: Orders

## 2023-03-04 NOTE — Telephone Encounter (Signed)
Spoke with wife. Pt will go to [facility] tomorrow for labs. Labs ordered for [facility]

## 2023-03-04 NOTE — Telephone Encounter (Signed)
Patient's wife called stating lab didn't have the orders. It looks like pt was scheduled for lab visit at our office. Patient normally goes to [facility]. LVM for wife to call back. Ordered labs so pt can go to [facility]

## 2023-03-04 NOTE — Telephone Encounter (Signed)
Patient's wife is returning call. Please advise.  

## 2023-03-06 DIAGNOSIS — I2510 Atherosclerotic heart disease of native coronary artery without angina pectoris: Secondary | ICD-10-CM | POA: Diagnosis not present

## 2023-03-06 DIAGNOSIS — E782 Mixed hyperlipidemia: Secondary | ICD-10-CM | POA: Diagnosis not present

## 2023-03-06 LAB — LIPID PANEL
Chol/HDL Ratio: 2.1 ratio (ref 0.0–5.0)
Cholesterol, Total: 141 mg/dL (ref 100–199)
HDL: 67 mg/dL
LDL Chol Calc (NIH): 52 mg/dL (ref 0–99)
Triglycerides: 126 mg/dL (ref 0–149)
VLDL Cholesterol Cal: 22 mg/dL (ref 5–40)

## 2023-03-06 LAB — COMPREHENSIVE METABOLIC PANEL WITH GFR
ALT: 17 IU/L (ref 0–44)
AST: 21 IU/L (ref 0–40)
Albumin/Globulin Ratio: 1.5 (ref 1.2–2.2)
Albumin: 4.3 g/dL (ref 3.8–4.9)
Alkaline Phosphatase: 78 IU/L (ref 44–121)
BUN/Creatinine Ratio: 19 (ref 9–20)
BUN: 23 mg/dL (ref 6–24)
Bilirubin Total: 0.5 mg/dL (ref 0.0–1.2)
CO2: 23 mmol/L (ref 20–29)
Calcium: 9.5 mg/dL (ref 8.7–10.2)
Chloride: 103 mmol/L (ref 96–106)
Creatinine, Ser: 1.2 mg/dL (ref 0.76–1.27)
Globulin, Total: 2.8 g/dL (ref 1.5–4.5)
Glucose: 92 mg/dL (ref 70–99)
Potassium: 4.6 mmol/L (ref 3.5–5.2)
Sodium: 141 mmol/L (ref 134–144)
Total Protein: 7.1 g/dL (ref 6.0–8.5)
eGFR: 71 mL/min/1.73

## 2023-03-08 ENCOUNTER — Other Ambulatory Visit: Payer: Self-pay | Admitting: *Deleted

## 2023-03-08 ENCOUNTER — Ambulatory Visit: Payer: Federal, State, Local not specified - PPO | Attending: Nurse Practitioner | Admitting: Physician Assistant

## 2023-03-08 ENCOUNTER — Encounter: Payer: Self-pay | Admitting: Physician Assistant

## 2023-03-08 ENCOUNTER — Ambulatory Visit: Payer: Federal, State, Local not specified - PPO | Admitting: Nurse Practitioner

## 2023-03-08 ENCOUNTER — Other Ambulatory Visit (HOSPITAL_BASED_OUTPATIENT_CLINIC_OR_DEPARTMENT_OTHER): Payer: Self-pay

## 2023-03-08 VITALS — BP 112/76 | HR 84 | Ht 66.0 in | Wt 161.4 lb

## 2023-03-08 DIAGNOSIS — I10 Essential (primary) hypertension: Secondary | ICD-10-CM | POA: Diagnosis not present

## 2023-03-08 DIAGNOSIS — I200 Unstable angina: Secondary | ICD-10-CM

## 2023-03-08 DIAGNOSIS — E782 Mixed hyperlipidemia: Secondary | ICD-10-CM

## 2023-03-08 DIAGNOSIS — I2510 Atherosclerotic heart disease of native coronary artery without angina pectoris: Secondary | ICD-10-CM

## 2023-03-08 DIAGNOSIS — E785 Hyperlipidemia, unspecified: Secondary | ICD-10-CM

## 2023-03-08 MED ORDER — ALLOPURINOL 100 MG PO TABS
100.0000 mg | ORAL_TABLET | Freq: Every day | ORAL | 0 refills | Status: DC
  Filled 2023-03-08: qty 30, 30d supply, fill #0

## 2023-03-08 MED ORDER — PANTOPRAZOLE SODIUM 40 MG PO TBEC
40.0000 mg | DELAYED_RELEASE_TABLET | Freq: Every day | ORAL | 0 refills | Status: DC
  Filled 2023-03-08: qty 30, 30d supply, fill #0

## 2023-03-08 MED ORDER — METOPROLOL TARTRATE 25 MG PO TABS
12.5000 mg | ORAL_TABLET | Freq: Two times a day (BID) | ORAL | 3 refills | Status: DC
  Filled 2023-03-08 – 2023-03-14 (×2): qty 90, 90d supply, fill #0
  Filled 2024-01-05: qty 90, 90d supply, fill #1

## 2023-03-08 NOTE — Patient Instructions (Signed)
Medication Instructions:  1.Stop isosorbide mononitrate (Imdur) *If you need a refill on your cardiac medications before your next appointment, please call your pharmacy*  Lab Work: None ordered If you have labs (blood work) drawn today and your tests are completely normal, you will receive your results only by: MyChart Message (if you have MyChart) OR A paper copy in the mail If you have any lab test that is abnormal or we need to change your treatment, we will call you to review the results.  Follow-Up: At [facility], you and your health needs are our priority.  As part of our continuing mission to provide you with exceptional heart care, we have created designated Provider Care Teams.  These Care Teams include your primary Cardiologist (physician) and Advanced Practice Providers (APPs -  Physician Assistants and Nurse Practitioners) who all work together to provide you with the care you need, when you need it.  Your next appointment:   7 month(s)  Provider:   Kristeen Miss, MD     Other Instructions 1.Check your blood pressure daily 1-2 hrs after morning medications for 2 wks, keep a log and send the readings through mychart after the 2 wks  2.Weigh every morning after using the restroom, before breakfast and let us know if you have a weight gain of 2 lbs or more overnight or 5 lbs or more in a wk 3.Increase cardiovascular activity  Heart-Healthy Eating Plan Many factors influence your heart health, including eating and exercise habits. Heart health is also called coronary health. Coronary risk increases with abnormal blood fat (lipid) levels. A heart-healthy eating plan includes limiting unhealthy fats, increasing healthy fats, limiting salt (sodium) intake, and making other diet and lifestyle changes. What is my plan? Your health care provider may recommend that: You limit your fat intake to _________% or less of your total calories each day. You limit your saturated fat  intake to _________% or less of your total calories each day. You limit the amount of cholesterol in your diet to less than _________ mg per day. You limit the amount of sodium in your diet to less than _________ mg per day. What are tips for following this plan? Cooking Cook foods using methods other than frying. Baking, boiling, grilling, and broiling are all good options. Other ways to reduce fat include: Removing the skin from poultry. Removing all visible fats from meats. Steaming vegetables in water or broth. Meal planning  At meals, imagine dividing your plate into fourths: Fill one-half of your plate with vegetables and green salads. Fill one-fourth of your plate with whole grains. Fill one-fourth of your plate with lean protein foods. Eat 2-4 cups of vegetables per day. One cup of vegetables equals 1 cup (91 g) broccoli or cauliflower florets, 2 medium carrots, 1 large bell pepper, 1 large sweet potato, 1 large tomato, 1 medium white potato, 2 cups (150 g) raw leafy greens. Eat 1-2 cups of fruit per day. One cup of fruit equals 1 small apple, 1 large banana, 1 cup (237 g) mixed fruit, 1 large orange,  cup (82 g) dried fruit, 1 cup (240 mL) 100% fruit juice. Eat more foods that contain soluble fiber. Examples include apples, broccoli, carrots, beans, peas, and barley. Aim to get 25-30 g of fiber per day. Increase your consumption of legumes, nuts, and seeds to 4-5 servings per week. One serving of dried beans or legumes equals  cup (90 g) cooked, 1 serving of nuts is  oz (12  almonds, 24 pistachios, or 7 walnut halves), and 1 serving of seeds equals  oz (8 g). Fats Choose healthy fats more often. Choose monounsaturated and polyunsaturated fats, such as olive and canola oils, avocado oil, flaxseeds, walnuts, almonds, and seeds. Eat more omega-3 fats. Choose salmon, mackerel, sardines, tuna, flaxseed oil, and ground flaxseeds. Aim to eat fish at least 2 times each week. Check food  labels carefully to identify foods with trans fats or high amounts of saturated fat. Limit saturated fats. These are found in animal products, such as meats, butter, and cream. Plant sources of saturated fats include palm oil, palm kernel oil, and coconut oil. Avoid foods with partially hydrogenated oils in them. These contain trans fats. Examples are stick margarine, some tub margarines, cookies, crackers, and other baked goods. Avoid fried foods. General information Eat more home-cooked food and less restaurant, buffet, and fast food. Limit or avoid alcohol. Limit foods that are high in added sugar and simple starches such as foods made using white refined flour (white breads, pastries, sweets). Lose weight if you are overweight. Losing just 5-10% of your body weight can help your overall health and prevent diseases such as diabetes and heart disease. Monitor your sodium intake, especially if you have high blood pressure. Talk with your health care provider about your sodium intake. Try to incorporate more vegetarian meals weekly. What foods should I eat? Fruits All fresh, canned (in natural juice), or frozen fruits. Vegetables Fresh or frozen vegetables (raw, steamed, roasted, or grilled). Green salads. Grains Most grains. Choose whole wheat and whole grains most of the time. Rice and pasta, including brown rice and pastas made with whole wheat. Meats and other proteins Lean, well-trimmed beef, veal, pork, and lamb. Chicken and [place] without skin. All fish and shellfish. Wild duck, rabbit, pheasant, and venison. Egg whites or low-cholesterol egg substitutes. Dried beans, peas, lentils, and tofu. Seeds and most nuts. Dairy Low-fat or nonfat cheeses, including ricotta and mozzarella. Skim or 1% milk (liquid, powdered, or evaporated). Buttermilk made with low-fat milk. Nonfat or low-fat yogurt. Fats and oils Non-hydrogenated (trans-free) margarines. Vegetable oils, including soybean, sesame,  sunflower, olive, avocado, peanut, safflower, corn, canola, and cottonseed. Salad dressings or mayonnaise made with a vegetable oil. Beverages Water (mineral or sparkling). Coffee and tea. Unsweetened ice tea. Diet beverages. Sweets and desserts Sherbet, gelatin, and fruit ice. Small amounts of dark chocolate. Limit all sweets and desserts. Seasonings and condiments All seasonings and condiments. The items listed above may not be a complete list of foods and beverages you can eat. Contact a dietitian for more options. What foods should I avoid? Fruits Canned fruit in heavy syrup. Fruit in cream or butter sauce. Fried fruit. Limit coconut. Vegetables Vegetables cooked in cheese, cream, or butter sauce. Fried vegetables. Grains Breads made with saturated or trans fats, oils, or whole milk. Croissants. Sweet rolls. Donuts. High-fat crackers, such as cheese crackers and chips. Meats and other proteins Fatty meats, such as hot dogs, ribs, sausage, bacon, rib-eye roast or steak. High-fat deli meats, such as salami and bologna. Caviar. Domestic duck and goose. Organ meats, such as liver. Dairy Cream, sour cream, cream cheese, and creamed cottage cheese. Whole-milk cheeses. Whole or 2% milk (liquid, evaporated, or condensed). Whole buttermilk. Cream sauce or high-fat cheese sauce. Whole-milk yogurt. Fats and oils Meat fat, or shortening. Cocoa butter, hydrogenated oils, palm oil, coconut oil, palm kernel oil. Solid fats and shortenings, including bacon fat, salt pork, lard, and butter. Nondairy cream substitutes. Salad  dressings with cheese or sour cream. Beverages Regular sodas and any drinks with added sugar. Sweets and desserts Frosting. Pudding. Cookies. Cakes. Pies. Milk chocolate or white chocolate. Buttered syrups. Full-fat ice cream or ice cream drinks. The items listed above may not be a complete list of foods and beverages to avoid. Contact a dietitian for more  information. Summary Heart-healthy meal planning includes limiting unhealthy fats, increasing healthy fats, limiting salt (sodium) intake and making other diet and lifestyle changes. Lose weight if you are overweight. Losing just 5-10% of your body weight can help your overall health and prevent diseases such as diabetes and heart disease. Focus on eating a balance of foods, including fruits and vegetables, low-fat or nonfat dairy, lean protein, nuts and legumes, whole grains, and heart-healthy oils and fats. This information is not intended to replace advice given to you by your health care provider. Make sure you discuss any questions you have with your health care provider. Document Revised: 12/04/2021 Document Reviewed: 12/04/2021 Elsevier Patient Education  2023 [employer]  Low-Sodium Eating Plan Sodium, which is an element that makes up salt, helps you maintain a healthy balance of fluids in your body. Too much sodium can increase your blood pressure and cause fluid and waste to be held in your body. Your health care provider or dietitian may recommend following this plan if you have high blood pressure (hypertension), kidney disease, liver disease, or heart failure. Eating less sodium can help lower your blood pressure, reduce swelling, and protect your heart, liver, and kidneys. What are tips for following this plan? Reading food labels The Nutrition Facts label lists the amount of sodium in one serving of the food. If you eat more than one serving, you must multiply the listed amount of sodium by the number of servings. Choose foods with less than 140 mg of sodium per serving. Avoid foods with 300 mg of sodium or more per serving. Shopping  Look for lower-sodium products, often labeled as "low-sodium" or "no salt added." Always check the sodium content, even if foods are labeled as "unsalted" or "no salt added." Buy fresh foods. Avoid canned foods and pre-made or frozen  meals. Avoid canned, cured, or processed meats. Buy breads that have less than 80 mg of sodium per slice. Cooking  Eat more home-cooked food and less restaurant, buffet, and fast food. Avoid adding salt when cooking. Use salt-free seasonings or herbs instead of table salt or sea salt. Check with your health care provider or pharmacist before using salt substitutes. Cook with plant-based oils, such as canola, sunflower, or olive oil. Meal planning When eating at a restaurant, ask that your food be prepared with less salt or no salt, if possible. Avoid dishes labeled as brined, pickled, cured, smoked, or made with soy sauce, miso, or teriyaki sauce. Avoid foods that contain MSG (monosodium glutamate). MSG is sometimes added to [place] food, bouillon, and some canned foods. Make meals that can be grilled, baked, poached, roasted, or steamed. These are generally made with less sodium. General information Most people on this plan should limit their sodium intake to 1,500-2,000 mg (milligrams) of sodium each day. What foods should I eat? Fruits Fresh, frozen, or canned fruit. Fruit juice. Vegetables Fresh or frozen vegetables. "No salt added" canned vegetables. "No salt added" tomato sauce and paste. Low-sodium or reduced-sodium tomato and vegetable juice. Grains Low-sodium cereals, including oats, puffed wheat and rice, and shredded wheat. Low-sodium crackers. Unsalted rice. Unsalted pasta. Low-sodium bread. Whole-grain breads and whole-grain  pasta. Meats and other proteins Fresh or frozen (no salt added) meat, poultry, seafood, and fish. Low-sodium canned tuna and salmon. Unsalted nuts. Dried peas, beans, and lentils without added salt. Unsalted canned beans. Eggs. Unsalted nut butters. Dairy Milk. Soy milk. Cheese that is naturally low in sodium, such as ricotta cheese, fresh mozzarella, or Swiss cheese. Low-sodium or reduced-sodium cheese. Cream cheese. Yogurt. Seasonings and condiments Fresh  and dried herbs and spices. Salt-free seasonings. Low-sodium mustard and ketchup. Sodium-free salad dressing. Sodium-free light mayonnaise. Fresh or refrigerated horseradish. Lemon juice. Vinegar. Other foods Homemade, reduced-sodium, or low-sodium soups. Unsalted popcorn and pretzels. Low-salt or salt-free chips. The items listed above may not be a complete list of foods and beverages you can eat. Contact a dietitian for more information. What foods should I avoid? Vegetables Sauerkraut, pickled vegetables, and relishes. Olives. [place] fries. Onion rings. Regular canned vegetables (not low-sodium or reduced-sodium). Regular canned tomato sauce and paste (not low-sodium or reduced-sodium). Regular tomato and vegetable juice (not low-sodium or reduced-sodium). Frozen vegetables in sauces. Grains Instant hot cereals. Bread stuffing, pancake, and biscuit mixes. Croutons. Seasoned rice or pasta mixes. Noodle soup cups. Boxed or frozen macaroni and cheese. Regular salted crackers. Self-rising flour. Meats and other proteins Meat or fish that is salted, canned, smoked, spiced, or pickled. Precooked or cured meat, such as sausages or meat loaves. Tomasa Blase. Ham. Pepperoni. Hot dogs. Corned beef. Chipped beef. Salt pork. Jerky. Pickled herring. Anchovies and sardines. Regular canned tuna. Salted nuts. Dairy Processed cheese and cheese spreads. Hard cheeses. Cheese curds. Blue cheese. Feta cheese. String cheese. Regular cottage cheese. Buttermilk. Canned milk. Fats and oils Salted butter. Regular margarine. Ghee. Bacon fat. Seasonings and condiments Onion salt, garlic salt, seasoned salt, table salt, and sea salt. Canned and packaged gravies. Worcestershire sauce. Tartar sauce. Barbecue sauce. Teriyaki sauce. Soy sauce, including reduced-sodium. Steak sauce. Fish sauce. Oyster sauce. Cocktail sauce. Horseradish that you find on the shelf. Regular ketchup and mustard. Meat flavorings and tenderizers. Bouillon  cubes. Hot sauce. Pre-made or packaged marinades. Pre-made or packaged taco seasonings. Relishes. Regular salad dressings. Salsa. Other foods Salted popcorn and pretzels. Corn chips and puffs. Potato and tortilla chips. Canned or dried soups. Pizza. Frozen entrees and pot pies. The items listed above may not be a complete list of foods and beverages you should avoid. Contact a dietitian for more information. Summary Eating less sodium can help lower your blood pressure, reduce swelling, and protect your heart, liver, and kidneys. Most people on this plan should limit their sodium intake to 1,500-2,000 mg (milligrams) of sodium each day. Canned, boxed, and frozen foods are high in sodium. Restaurant foods, fast foods, and pizza are also very high in sodium. You also get sodium by adding salt to food. Try to cook at home, eat more fresh fruits and vegetables, and eat less fast food and canned, processed, or prepared foods. This information is not intended to replace advice given to you by your health care provider. Make sure you discuss any questions you have with your health care provider. Document Revised: 10/05/2019 Document Reviewed: 09/30/2019 Elsevier Patient Education  2023 [employer].
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
## 2023-03-09 ENCOUNTER — Other Ambulatory Visit (HOSPITAL_COMMUNITY): Payer: Self-pay

## 2023-03-10 MED ORDER — REPATHA SURECLICK 140 MG/ML ~~LOC~~ SOAJ
140.0000 mg | SUBCUTANEOUS | 0 refills | Status: DC
  Filled 2023-03-10: qty 4, 56d supply, fill #0
  Filled 2023-03-12: qty 4, 60d supply, fill #0

## 2023-03-11 ENCOUNTER — Other Ambulatory Visit (HOSPITAL_BASED_OUTPATIENT_CLINIC_OR_DEPARTMENT_OTHER): Payer: Self-pay

## 2023-03-11 DIAGNOSIS — M109 Gout, unspecified: Secondary | ICD-10-CM | POA: Diagnosis not present

## 2023-03-11 DIAGNOSIS — I10 Essential (primary) hypertension: Secondary | ICD-10-CM | POA: Diagnosis not present

## 2023-03-11 DIAGNOSIS — N050 Unspecified nephritic syndrome with minor glomerular abnormality: Secondary | ICD-10-CM | POA: Diagnosis not present

## 2023-03-11 DIAGNOSIS — E785 Hyperlipidemia, unspecified: Secondary | ICD-10-CM | POA: Diagnosis not present

## 2023-03-11 MED ORDER — COLCHICINE 0.6 MG PO TABS
0.6000 mg | ORAL_TABLET | Freq: Every day | ORAL | 2 refills | Status: AC
  Filled 2023-03-11: qty 30, 30d supply, fill #0
  Filled 2024-02-29: qty 30, 30d supply, fill #1

## 2023-03-11 MED ORDER — ALLOPURINOL 300 MG PO TABS
150.0000 mg | ORAL_TABLET | Freq: Every day | ORAL | 3 refills | Status: DC
  Filled 2023-03-11: qty 45, 90d supply, fill #0
  Filled 2023-05-19: qty 45, 90d supply, fill #1
  Filled 2023-09-11: qty 45, 90d supply, fill #2

## 2023-03-12 ENCOUNTER — Other Ambulatory Visit (HOSPITAL_BASED_OUTPATIENT_CLINIC_OR_DEPARTMENT_OTHER): Payer: Self-pay

## 2023-03-12 MED ORDER — REPATHA SURECLICK 140 MG/ML ~~LOC~~ SOAJ
140.0000 mg | SUBCUTANEOUS | 0 refills | Status: DC
  Filled 2023-04-25: qty 4, 56d supply, fill #0

## 2023-03-12 MED ORDER — METOPROLOL TARTRATE 25 MG PO TABS: 25.0000 mg | ORAL_TABLET | Freq: Two times a day (BID) | ORAL | 3 refills | Status: DC

## 2023-03-12 MED ORDER — TACROLIMUS 1 MG PO CAPS
2.0000 mg | ORAL_CAPSULE | Freq: Two times a day (BID) | ORAL | 3 refills | Status: DC
  Filled 2023-05-19: qty 360, 90d supply, fill #0

## 2023-03-12 MED ORDER — ALLOPURINOL 100 MG PO TABS: 50.0000 mg | ORAL_TABLET | Freq: Every day | ORAL | 3 refills | Status: DC

## 2023-03-12 MED ORDER — PREDNISONE 10 MG PO TABS: 10.0000 mg | ORAL_TABLET | Freq: Every day | ORAL | 3 refills | Status: DC

## 2023-03-13 ENCOUNTER — Other Ambulatory Visit: Payer: Self-pay

## 2023-03-13 ENCOUNTER — Encounter (HOSPITAL_BASED_OUTPATIENT_CLINIC_OR_DEPARTMENT_OTHER): Payer: Self-pay

## 2023-03-13 ENCOUNTER — Other Ambulatory Visit (HOSPITAL_BASED_OUTPATIENT_CLINIC_OR_DEPARTMENT_OTHER): Payer: Self-pay

## 2023-03-13 MED ORDER — LISINOPRIL 10 MG PO TABS
5.0000 mg | ORAL_TABLET | Freq: Every day | ORAL | 3 refills | Status: DC
  Filled 2023-03-18 – 2023-03-22 (×2): qty 45, 90d supply, fill #0
  Filled 2023-06-18: qty 45, 90d supply, fill #1

## 2023-03-14 ENCOUNTER — Other Ambulatory Visit (HOSPITAL_BASED_OUTPATIENT_CLINIC_OR_DEPARTMENT_OTHER): Payer: Self-pay

## 2023-03-19 ENCOUNTER — Other Ambulatory Visit (HOSPITAL_BASED_OUTPATIENT_CLINIC_OR_DEPARTMENT_OTHER): Payer: Self-pay

## 2023-03-19 ENCOUNTER — Other Ambulatory Visit: Payer: Self-pay | Admitting: Physician Assistant

## 2023-03-19 DIAGNOSIS — M779 Enthesopathy, unspecified: Secondary | ICD-10-CM | POA: Diagnosis not present

## 2023-03-22 ENCOUNTER — Other Ambulatory Visit (HOSPITAL_BASED_OUTPATIENT_CLINIC_OR_DEPARTMENT_OTHER): Payer: Self-pay

## 2023-03-25 ENCOUNTER — Other Ambulatory Visit (HOSPITAL_BASED_OUTPATIENT_CLINIC_OR_DEPARTMENT_OTHER): Payer: Self-pay

## 2023-03-25 ENCOUNTER — Other Ambulatory Visit: Payer: Self-pay | Admitting: Physician Assistant

## 2023-03-25 MED ORDER — ICOSAPENT ETHYL 1 G PO CAPS
2.0000 g | ORAL_CAPSULE | Freq: Two times a day (BID) | ORAL | 1 refills | Status: DC
  Filled 2023-03-25: qty 120, 30d supply, fill #0
  Filled 2023-04-19: qty 120, 30d supply, fill #1

## 2023-03-25 NOTE — Telephone Encounter (Signed)
Patient of Dr. Nahser. Please review for refill. Thank you!  

## 2023-03-28 NOTE — Telephone Encounter (Signed)
Tessa refilled 03/25/2023

## 2023-04-02 ENCOUNTER — Other Ambulatory Visit (HOSPITAL_BASED_OUTPATIENT_CLINIC_OR_DEPARTMENT_OTHER): Payer: Self-pay

## 2023-04-02 DIAGNOSIS — R42 Dizziness and giddiness: Secondary | ICD-10-CM | POA: Diagnosis not present

## 2023-04-02 MED ORDER — MECLIZINE HCL 25 MG PO TABS
25.0000 mg | ORAL_TABLET | Freq: Four times a day (QID) | ORAL | 1 refills | Status: DC | PRN
  Filled 2023-04-02: qty 20, 5d supply, fill #0

## 2023-04-02 MED ORDER — PANTOPRAZOLE SODIUM 40 MG PO TBEC
40.0000 mg | DELAYED_RELEASE_TABLET | Freq: Every day | ORAL | 3 refills | Status: DC | PRN
  Filled 2023-04-02: qty 90, 90d supply, fill #0
  Filled 2023-07-09: qty 90, 90d supply, fill #1
  Filled 2023-09-29: qty 90, 90d supply, fill #2
  Filled 2024-01-08: qty 90, 90d supply, fill #3

## 2023-04-10 ENCOUNTER — Other Ambulatory Visit: Payer: Self-pay | Admitting: Cardiology

## 2023-04-11 ENCOUNTER — Other Ambulatory Visit (HOSPITAL_BASED_OUTPATIENT_CLINIC_OR_DEPARTMENT_OTHER): Payer: Self-pay

## 2023-04-11 MED FILL — Clopidogrel Bisulfate Tab 75 MG (Base Equiv): ORAL | 90 days supply | Qty: 90 | Fill #0 | Status: AC

## 2023-04-25 ENCOUNTER — Other Ambulatory Visit (HOSPITAL_BASED_OUTPATIENT_CLINIC_OR_DEPARTMENT_OTHER): Payer: Self-pay

## 2023-04-29 ENCOUNTER — Other Ambulatory Visit (HOSPITAL_BASED_OUTPATIENT_CLINIC_OR_DEPARTMENT_OTHER): Payer: Self-pay

## 2023-05-03 DIAGNOSIS — N179 Acute kidney failure, unspecified: Secondary | ICD-10-CM | POA: Diagnosis not present

## 2023-05-07 ENCOUNTER — Telehealth: Payer: Self-pay | Admitting: Pharmacist

## 2023-05-07 NOTE — Telephone Encounter (Signed)
Patient's wife called stating she got a letter from [facility] that patient's PA for Repatha will expire in July.

## 2023-05-09 ENCOUNTER — Other Ambulatory Visit (HOSPITAL_COMMUNITY): Payer: Self-pay

## 2023-05-09 ENCOUNTER — Telehealth: Payer: Self-pay

## 2023-05-09 NOTE — Telephone Encounter (Signed)
Pharmacy Patient Advocate Encounter   Received notification from [facility] that prior authorization for REPATHA 140 MG/ML INJ is needed.    PA submitted on 05/09/23 Key [id number] Status is pending  Haze Rushing, CPhT Pharmacy Patient Advocate Specialist Direct Number: [phone] Fax: [phone]

## 2023-05-09 NOTE — Telephone Encounter (Signed)
PA initiated, please see separate encounter for updates on determination. (I will route you back in once a decision has been made)  Beulah Matusek, CPhT Pharmacy Patient Advocate Specialist Direct Number: [phone] Fax: [phone]  

## 2023-05-10 ENCOUNTER — Other Ambulatory Visit (HOSPITAL_BASED_OUTPATIENT_CLINIC_OR_DEPARTMENT_OTHER): Payer: Self-pay

## 2023-05-10 DIAGNOSIS — E785 Hyperlipidemia, unspecified: Secondary | ICD-10-CM | POA: Diagnosis not present

## 2023-05-10 DIAGNOSIS — N050 Unspecified nephritic syndrome with minor glomerular abnormality: Secondary | ICD-10-CM | POA: Diagnosis not present

## 2023-05-10 DIAGNOSIS — I10 Essential (primary) hypertension: Secondary | ICD-10-CM | POA: Diagnosis not present

## 2023-05-10 DIAGNOSIS — I2510 Atherosclerotic heart disease of native coronary artery without angina pectoris: Secondary | ICD-10-CM | POA: Diagnosis not present

## 2023-05-10 MED ORDER — PREDNISONE 1 MG PO TABS
4.0000 mg | ORAL_TABLET | Freq: Every day | ORAL | 3 refills | Status: DC
  Filled 2023-05-10: qty 360, 90d supply, fill #0
  Filled 2023-08-13: qty 360, 90d supply, fill #1

## 2023-05-10 NOTE — Telephone Encounter (Signed)
Left detailed VM per DPR that PA was approved.

## 2023-05-10 NOTE — Telephone Encounter (Signed)
Pharmacy Patient Advocate Encounter  Prior Authorization for REPATHA has been approved.    Effective dates: 04/09/23 through 05/08/24  Rodney Strickland, CPhT Pharmacy Patient Advocate Specialist Direct Number: [phone] Fax: [phone]

## 2023-05-19 ENCOUNTER — Other Ambulatory Visit: Payer: Self-pay | Admitting: Physician Assistant

## 2023-05-19 DIAGNOSIS — E785 Hyperlipidemia, unspecified: Secondary | ICD-10-CM

## 2023-05-20 ENCOUNTER — Other Ambulatory Visit: Payer: Self-pay

## 2023-05-20 ENCOUNTER — Other Ambulatory Visit (HOSPITAL_BASED_OUTPATIENT_CLINIC_OR_DEPARTMENT_OTHER): Payer: Self-pay

## 2023-05-20 MED ORDER — ICOSAPENT ETHYL 1 G PO CAPS
2.0000 g | ORAL_CAPSULE | Freq: Two times a day (BID) | ORAL | 5 refills | Status: DC
  Filled 2023-05-20: qty 120, 30d supply, fill #0
  Filled 2023-06-18: qty 120, 30d supply, fill #1
  Filled 2023-07-09 – 2023-07-11 (×2): qty 120, 30d supply, fill #2
  Filled 2023-08-13: qty 120, 30d supply, fill #3
  Filled 2023-09-11: qty 120, 30d supply, fill #4
  Filled 2023-10-23: qty 120, 30d supply, fill #5

## 2023-05-20 NOTE — Telephone Encounter (Signed)
Patient of Dr. Nahser. Please review for refill. Thank you!  

## 2023-06-10 DIAGNOSIS — Z1212 Encounter for screening for malignant neoplasm of rectum: Secondary | ICD-10-CM | POA: Diagnosis not present

## 2023-06-10 DIAGNOSIS — E782 Mixed hyperlipidemia: Secondary | ICD-10-CM | POA: Diagnosis not present

## 2023-06-10 DIAGNOSIS — D649 Anemia, unspecified: Secondary | ICD-10-CM | POA: Diagnosis not present

## 2023-06-13 DIAGNOSIS — I129 Hypertensive chronic kidney disease with stage 1 through stage 4 chronic kidney disease, or unspecified chronic kidney disease: Secondary | ICD-10-CM | POA: Diagnosis not present

## 2023-06-13 DIAGNOSIS — Z0000 Encounter for general adult medical examination without abnormal findings: Secondary | ICD-10-CM | POA: Diagnosis not present

## 2023-06-13 DIAGNOSIS — Z1331 Encounter for screening for depression: Secondary | ICD-10-CM | POA: Diagnosis not present

## 2023-06-13 DIAGNOSIS — R82998 Other abnormal findings in urine: Secondary | ICD-10-CM | POA: Diagnosis not present

## 2023-06-13 DIAGNOSIS — Z1339 Encounter for screening examination for other mental health and behavioral disorders: Secondary | ICD-10-CM | POA: Diagnosis not present

## 2023-06-13 DIAGNOSIS — R809 Proteinuria, unspecified: Secondary | ICD-10-CM | POA: Diagnosis not present

## 2023-06-13 DIAGNOSIS — I25810 Atherosclerosis of coronary artery bypass graft(s) without angina pectoris: Secondary | ICD-10-CM | POA: Diagnosis not present

## 2023-06-18 ENCOUNTER — Other Ambulatory Visit: Payer: Self-pay | Admitting: Cardiovascular Disease

## 2023-06-18 ENCOUNTER — Other Ambulatory Visit (HOSPITAL_BASED_OUTPATIENT_CLINIC_OR_DEPARTMENT_OTHER): Payer: Self-pay

## 2023-06-18 DIAGNOSIS — I2510 Atherosclerotic heart disease of native coronary artery without angina pectoris: Secondary | ICD-10-CM

## 2023-06-18 DIAGNOSIS — E782 Mixed hyperlipidemia: Secondary | ICD-10-CM

## 2023-06-18 MED ORDER — REPATHA SURECLICK 140 MG/ML ~~LOC~~ SOAJ
140.0000 mg | SUBCUTANEOUS | 3 refills | Status: DC
  Filled 2023-06-18: qty 6, 84d supply, fill #0
  Filled 2023-06-24 (×2): qty 4, 56d supply, fill #0
  Filled 2023-08-13: qty 4, 56d supply, fill #1
  Filled 2023-09-28: qty 4, 56d supply, fill #2
  Filled 2023-11-17: qty 4, 56d supply, fill #3

## 2023-06-24 ENCOUNTER — Other Ambulatory Visit (HOSPITAL_BASED_OUTPATIENT_CLINIC_OR_DEPARTMENT_OTHER): Payer: Self-pay

## 2023-06-25 ENCOUNTER — Other Ambulatory Visit (HOSPITAL_BASED_OUTPATIENT_CLINIC_OR_DEPARTMENT_OTHER): Payer: Self-pay

## 2023-07-09 ENCOUNTER — Other Ambulatory Visit (HOSPITAL_BASED_OUTPATIENT_CLINIC_OR_DEPARTMENT_OTHER): Payer: Self-pay

## 2023-07-09 MED FILL — Clopidogrel Bisulfate Tab 75 MG (Base Equiv): ORAL | 90 days supply | Qty: 90 | Fill #1 | Status: AC

## 2023-07-16 DIAGNOSIS — N050 Unspecified nephritic syndrome with minor glomerular abnormality: Secondary | ICD-10-CM | POA: Diagnosis not present

## 2023-07-16 DIAGNOSIS — I10 Essential (primary) hypertension: Secondary | ICD-10-CM | POA: Diagnosis not present

## 2023-07-16 DIAGNOSIS — N179 Acute kidney failure, unspecified: Secondary | ICD-10-CM | POA: Diagnosis not present

## 2023-07-16 DIAGNOSIS — E785 Hyperlipidemia, unspecified: Secondary | ICD-10-CM | POA: Diagnosis not present

## 2023-07-16 DIAGNOSIS — M109 Gout, unspecified: Secondary | ICD-10-CM | POA: Diagnosis not present

## 2023-07-18 ENCOUNTER — Other Ambulatory Visit (HOSPITAL_COMMUNITY): Payer: Self-pay

## 2023-07-19 ENCOUNTER — Other Ambulatory Visit (HOSPITAL_COMMUNITY): Payer: Self-pay

## 2023-07-22 ENCOUNTER — Other Ambulatory Visit (HOSPITAL_COMMUNITY): Payer: Self-pay

## 2023-07-22 ENCOUNTER — Other Ambulatory Visit (HOSPITAL_BASED_OUTPATIENT_CLINIC_OR_DEPARTMENT_OTHER): Payer: Self-pay

## 2023-07-23 ENCOUNTER — Other Ambulatory Visit (HOSPITAL_BASED_OUTPATIENT_CLINIC_OR_DEPARTMENT_OTHER): Payer: Self-pay

## 2023-07-25 ENCOUNTER — Other Ambulatory Visit (HOSPITAL_BASED_OUTPATIENT_CLINIC_OR_DEPARTMENT_OTHER): Payer: Self-pay

## 2023-08-13 ENCOUNTER — Other Ambulatory Visit: Payer: Self-pay

## 2023-08-13 ENCOUNTER — Other Ambulatory Visit (HOSPITAL_BASED_OUTPATIENT_CLINIC_OR_DEPARTMENT_OTHER): Payer: Self-pay

## 2023-08-15 DIAGNOSIS — N179 Acute kidney failure, unspecified: Secondary | ICD-10-CM | POA: Diagnosis not present

## 2023-08-23 ENCOUNTER — Other Ambulatory Visit (HOSPITAL_BASED_OUTPATIENT_CLINIC_OR_DEPARTMENT_OTHER): Payer: Self-pay

## 2023-08-23 MED ORDER — TACROLIMUS 1 MG PO CAPS
1.0000 mg | ORAL_CAPSULE | Freq: Two times a day (BID) | ORAL | 3 refills | Status: DC
  Filled 2023-08-23 – 2023-09-11 (×2): qty 180, 90d supply, fill #0

## 2023-09-05 ENCOUNTER — Other Ambulatory Visit (HOSPITAL_BASED_OUTPATIENT_CLINIC_OR_DEPARTMENT_OTHER): Payer: Self-pay

## 2023-09-09 DIAGNOSIS — N179 Acute kidney failure, unspecified: Secondary | ICD-10-CM | POA: Diagnosis not present

## 2023-09-11 ENCOUNTER — Other Ambulatory Visit (HOSPITAL_BASED_OUTPATIENT_CLINIC_OR_DEPARTMENT_OTHER): Payer: Self-pay

## 2023-09-11 ENCOUNTER — Other Ambulatory Visit: Payer: Self-pay

## 2023-09-11 MED ORDER — LISINOPRIL 10 MG PO TABS
5.0000 mg | ORAL_TABLET | Freq: Every day | ORAL | 3 refills | Status: DC
  Filled 2023-09-11: qty 45, 90d supply, fill #0
  Filled 2024-01-05: qty 45, 90d supply, fill #1
  Filled 2024-03-30: qty 45, 90d supply, fill #2

## 2023-09-18 DIAGNOSIS — N050 Unspecified nephritic syndrome with minor glomerular abnormality: Secondary | ICD-10-CM | POA: Diagnosis not present

## 2023-09-18 DIAGNOSIS — I10 Essential (primary) hypertension: Secondary | ICD-10-CM | POA: Diagnosis not present

## 2023-09-18 DIAGNOSIS — T50905A Adverse effect of unspecified drugs, medicaments and biological substances, initial encounter: Secondary | ICD-10-CM | POA: Diagnosis not present

## 2023-09-18 DIAGNOSIS — I2510 Atherosclerotic heart disease of native coronary artery without angina pectoris: Secondary | ICD-10-CM | POA: Diagnosis not present

## 2023-09-18 DIAGNOSIS — M109 Gout, unspecified: Secondary | ICD-10-CM | POA: Diagnosis not present

## 2023-09-18 DIAGNOSIS — I129 Hypertensive chronic kidney disease with stage 1 through stage 4 chronic kidney disease, or unspecified chronic kidney disease: Secondary | ICD-10-CM | POA: Diagnosis not present

## 2023-09-29 MED FILL — Clopidogrel Bisulfate Tab 75 MG (Base Equiv): ORAL | 90 days supply | Qty: 90 | Fill #2 | Status: AC

## 2023-09-30 ENCOUNTER — Other Ambulatory Visit (HOSPITAL_BASED_OUTPATIENT_CLINIC_OR_DEPARTMENT_OTHER): Payer: Self-pay

## 2023-10-21 DIAGNOSIS — N179 Acute kidney failure, unspecified: Secondary | ICD-10-CM | POA: Diagnosis not present

## 2023-11-15 DIAGNOSIS — N179 Acute kidney failure, unspecified: Secondary | ICD-10-CM | POA: Diagnosis not present

## 2023-11-17 ENCOUNTER — Other Ambulatory Visit: Payer: Self-pay | Admitting: Physician Assistant

## 2023-11-17 DIAGNOSIS — E785 Hyperlipidemia, unspecified: Secondary | ICD-10-CM

## 2023-11-18 ENCOUNTER — Other Ambulatory Visit (HOSPITAL_BASED_OUTPATIENT_CLINIC_OR_DEPARTMENT_OTHER): Payer: Self-pay

## 2023-11-18 ENCOUNTER — Encounter: Payer: Self-pay | Admitting: Cardiovascular Disease

## 2023-11-18 ENCOUNTER — Other Ambulatory Visit (HOSPITAL_COMMUNITY): Payer: Self-pay

## 2023-11-18 ENCOUNTER — Telehealth: Payer: Self-pay | Admitting: Pharmacy Technician

## 2023-11-18 DIAGNOSIS — I2510 Atherosclerotic heart disease of native coronary artery without angina pectoris: Secondary | ICD-10-CM | POA: Diagnosis not present

## 2023-11-18 DIAGNOSIS — I129 Hypertensive chronic kidney disease with stage 1 through stage 4 chronic kidney disease, or unspecified chronic kidney disease: Secondary | ICD-10-CM | POA: Diagnosis not present

## 2023-11-18 DIAGNOSIS — E785 Hyperlipidemia, unspecified: Secondary | ICD-10-CM | POA: Diagnosis not present

## 2023-11-18 DIAGNOSIS — I10 Essential (primary) hypertension: Secondary | ICD-10-CM | POA: Diagnosis not present

## 2023-11-18 DIAGNOSIS — N050 Unspecified nephritic syndrome with minor glomerular abnormality: Secondary | ICD-10-CM | POA: Diagnosis not present

## 2023-11-18 MED ORDER — ICOSAPENT ETHYL 1 G PO CAPS
2.0000 g | ORAL_CAPSULE | Freq: Two times a day (BID) | ORAL | 5 refills | Status: DC
  Filled 2023-11-18: qty 120, 30d supply, fill #0
  Filled 2023-12-19: qty 120, 30d supply, fill #1
  Filled 2024-01-20: qty 120, 30d supply, fill #2
  Filled 2024-02-20: qty 120, 30d supply, fill #3
  Filled 2024-03-17: qty 120, 30d supply, fill #4
  Filled 2024-04-21: qty 120, 30d supply, fill #5

## 2023-11-18 NOTE — Telephone Encounter (Signed)
 Pharmacy Patient Advocate Encounter   Received notification from CoverMyMeds that prior authorization for Repatha  SureClick 140MG /ML auto-injectors is required/requested.   Insurance verification completed.   The patient is insured through [facility] .   Per test claim: PA required; PA submitted to above mentioned insurance via CoverMyMeds Key/confirmation #/EOC Key: [id number] Status is pending

## 2023-11-18 NOTE — Progress Notes (Signed)
 Cardiology Office Note:    Date:  11/19/2023   ID:  Rodney Strickland, DOB 03-Nov-1966, MRN [id number]  PCP:  Rodney Charlie ORN, MD  [facility] Cardiologist:  Rodney [facility] Electrophysiologist:  None   Referring MD: Rodney Charlie ORN, MD   Chief Complaint  Patient presents with   Callouses   Hypertension         Jan. 27, 2022    Rodney Strickland is a 58 y.o. male with a hx of chest pain.  We were asked to see him for further evaluation of his chest pain by Dr. Rosaline Strickland.  Seen  With wife [name]  ( works on [employer] E)   CP started 2 weeks ago.   Associated with HTN He started BP meds,  Now he does not have CP but does have fatiigue.  He went to the ER  BP is typically around 140s / 80s  Had been eating lots more salt foods prior to  His ER visit .  Troponin was negative in the ER.   His symptoms are consistent with tightness and pressure-like sensation.  Last for a few minutes and seems to be associated with exertion.  He also notes that it was much worse when his blood pressure was up.  Has not had stress test .   March 07, 2021: [name] is seen today for follow up visit He has had CABG.  Has lost lots of weight  Wt is 139   He was hospitalized in March.  He was thought at some point to have pericardial tamponade.  Right and right heart cath revealed normal RV filling pressures.  Echocardiogram shows normal left ventricular systolic function.  Has had a renal bx - Was started on steroids,  Renal function improved.  Was on Dapsone  - but this caused liver enzymes.  Had significant elevation of his creatinine - up to 3.2 In March, 2022   October 25, 2021:  [name] is seen today for follow-up of his coronary artery bypass grafting. Weight today is 137 pounds. He is seeing nephrology for his kidney issues. Has significant proteinuria -- responsive to prenisone Nephrology has suggest minimal change disease   Is currently on prednisone  40 mg a day.  Creatinine  from Oct 19 shows a creatinine of 1.01.   Jan.7, 2025 [name] is seen for follow up of his CAD , CABG Seen with wife , [name]   Recent labs  Lipids I June 10, 2023  Chol = 129 HDL = 65 LDL = 42 Trigs = 888   Creatinine from Nov 15, 2023 is 1.39      Past Medical History:  Diagnosis Date   Coronary artery disease    Gout    Hypercholesteremia    Hypertension    Injury of both lungs 1986   water  in lungs    Past Surgical History:  Procedure Laterality Date   CARDIAC CATHETERIZATION     CHOLECYSTECTOMY     CORONARY ARTERY BYPASS GRAFT N/A 01/05/2021   Procedure: CORONARY ARTERY BYPASS GRAFTING (CABG) TIMES FOUR, USING BILATERAL INTERNAL MAMMARY ARTERIES AND LEFT RADIAL ARTERY;  Surgeon: Rodney Bartlett PEDLAR, MD;  [facility]: [facility] OR;  Service: Open Heart Surgery;  Laterality: N/A;   IR THORACENTESIS ASP PLEURAL SPACE W/IMG GUIDE  01/19/2021   LEFT HEART CATH AND CORONARY ANGIOGRAPHY N/A 12/29/2020   Procedure: LEFT HEART CATH AND CORONARY ANGIOGRAPHY;  Surgeon: Rodney Bruckner, MD;  [facility]: [facility] INVASIVE CV LAB;  Service:  Cardiovascular;  Laterality: N/A;   LEFT HEART CATH AND CORS/GRAFTS ANGIOGRAPHY N/A 10/16/2022   Procedure: LEFT HEART CATH AND CORS/GRAFTS ANGIOGRAPHY;  Surgeon: Rodney Lurena POUR, MD;  Location: [facility] INVASIVE CV LAB;  Service: Cardiovascular;  Laterality: N/A;   RADIAL ARTERY HARVEST Left 01/05/2021   Procedure: RADIAL ARTERY HARVEST;  Surgeon: Rodney Bartlett PEDLAR, MD;  Location: [facility] OR;  Service: Open Heart Surgery;  Laterality: Left;   RIGHT HEART CATH N/A 01/20/2021   Procedure: RIGHT HEART CATH;  Surgeon: Rodney Ezra RAMAN, MD;  Location[facility] INVASIVE CV LAB;  Service: Cardiovascular;  Laterality: N/A;   TEE WITHOUT CARDIOVERSION N/A 01/05/2021   Procedure: TRANSESOPHAGEAL ECHOCARDIOGRAM (TEE);  Surgeon: Rodney Bartlett PEDLAR, MD;  Location: [facility] OR;  Service: Open Heart Surgery;  Laterality: N/A;   THORACENTESIS      Current Medications: Current Meds  Medication Sig    acetaminophen  (TYLENOL ) 325 MG tablet Take 650 mg by mouth every 6 (six) hours as needed for headache.   allopurinol  (ZYLOPRIM ) 300 MG tablet Take 0.5 tablets (150 mg total) by mouth daily.   clopidogrel  (PLAVIX ) 75 MG tablet Take 1 tablet (75 mg total) by mouth daily.   Evolocumab  (REPATHA  SURECLICK) 140 MG/ML SOAJ Inject 140 mg into the skin every 14 (fourteen) days.   famotidine -calcium  carbonate-magnesium  hydroxide (PEPCID  COMPLETE) 10-800-165 MG chewable tablet Chew 1 tablet by mouth daily as needed (acid reflux).   icosapent  Ethyl (VASCEPA ) 1 g capsule Take 2 capsules (2 g total) by mouth 2 (two) times daily. With food   lisinopril  (ZESTRIL ) 10 MG tablet Take 0.5 tablets (5 mg total) by mouth daily.   metoprolol  tartrate (LOPRESSOR ) 25 MG tablet Take 0.5 tablets (12.5 mg total) by mouth 2 (two) times daily.   nitroGLYCERIN  (NITROSTAT ) 0.4 MG SL tablet Place 1 tablet (0.4 mg total) under the tongue every 5 (five) minutes x 3 doses as needed for chest pain.   predniSONE  (DELTASONE ) 1 MG tablet Take 4 tablets (4 mg total) by mouth daily.   zinc  gluconate 50 MG tablet Take 50 mg by mouth daily.   [DISCONTINUED] aspirin  EC 81 MG tablet Take 81 mg by mouth daily. Swallow whole.   [DISCONTINUED] pantoprazole  (PROTONIX ) 40 MG tablet Take 1 tablet (40 mg total) by mouth daily. Further refills to be approved by PCP     Allergies:   Dapsone and Statins   Social History   Socioeconomic History   Marital status: Married    Spouse name: Not on file   Number of children: 3   Years of education: Not on file   Highest education level: Not on file  Occupational History   Not on file  Tobacco Use   Smoking status: Never   Smokeless tobacco: Never  Vaping Use   Vaping status: Never Used  Substance and Sexual Activity   Alcohol  use: No   Drug use: No   Sexual activity: Not on file  Other Topics Concern   Not on file  Social History Narrative   Not on file   Social Drivers of Health    Financial Resource Strain: Not on file  Food Insecurity: Not on file  Transportation Needs: Not on file  Physical Activity: Not on file  Stress: Not on file  Social Connections: Not on file     Family History: The patient's family history includes Diabetes type II in his mother; Hypertension in his father and sister; Stroke in his father.  ROS:   Please see the history of  present illness.     All other systems reviewed and are negative.  EKGs/Labs/Other Studies Reviewed:    The following studies were reviewed today:   EKG:   EKG Interpretation Date/Time:  Tuesday November 19 2023 16:22:00 EST Ventricular Rate:  62 PR Interval:  152 QRS Duration:  106 QT Interval:  398 QTC Calculation: 403 R Axis:   42  Text Interpretation: Normal sinus rhythm Incomplete right bundle branch block When compared with ECG of 17-Oct-2022 09:09, No significant change was found Confirmed by Rodney Mungo [phone]) on 11/19/2023 5:06:33 PM     Recent Labs: 03/06/2023: ALT 17; BUN 23; Creatinine, Ser 1.20; Potassium 4.6; Sodium 141  Recent Lipid Panel    Component Value Date/Time   CHOL 141 03/06/2023 0704   TRIG 126 03/06/2023 0704   HDL 67 03/06/2023 0704   CHOLHDL 2.1 03/06/2023 0704   CHOLHDL 2.3 10/16/2022 0319   VLDL 7 10/16/2022 0319   LDLCALC 52 03/06/2023 0704     Risk Assessment/Calculations:       Physical Exam:     Physical Exam: Blood pressure 114/78, pulse 65, height 5' 6 (1.676 m), weight 160 lb 9.6 oz (72.8 kg), SpO2 97%.       GEN:  Well nourished, well developed in no acute distress HEENT: Normal NECK: No JVD; No carotid bruits LYMPHATICS: No lymphadenopathy CARDIAC: RRR , no murmurs, rubs, gallops RESPIRATORY:  Clear to auscultation without rales, wheezing or rhonchi  ABDOMEN: Soft, non-tender, non-distended MUSCULOSKELETAL:  No edema; No deformity  SKIN: Warm and dry NEUROLOGIC:  Alert and oriented x 3      ASSESSMENT:    1. Coronary artery  disease involving native coronary artery of native heart without angina pectoris   2. Right hip pain     PLAN:      1.  CAD  CABG  No angina     2.  Hypertension:    BP is well controlled.   3. CKD:   Has minimal change disease.  4.  Right hip pain :   ? Bursitis.  Will refer him to [facility]  to see Dr. Claudene or partner      Medication Adjustments/Labs and Tests Ordered: Current medicines are reviewed at length with the patient today.  Concerns regarding medicines are outlined above.  Orders Placed This Encounter  Procedures   AMB referral to sports medicine   EKG 12-Lead    Meds ordered this encounter  Medications   pantoprazole  (PROTONIX ) 40 MG tablet    Sig: Take 1 tablet (40 mg total) by mouth daily as needed (acid reflux). Further refills to be approved by PCP   famotidine -calcium  carbonate-magnesium  hydroxide (PEPCID  COMPLETE) 10-800-165 MG chewable tablet    Sig: Chew 1 tablet by mouth daily as needed (acid reflux).      Patient Instructions  Medication Instructions:  Your physician has recommended you make the following change in your medication:   1) STOP aspirin  2) CHANGE pantoprazole  (Prontix) to as needed 3) START Pepcid  Complete daily as needed, can be purchased over the counter  *If you need a refill on your cardiac medications before your next appointment, please call your pharmacy*  Lab Work: None ordered today.  Testing/Procedures: None ordered today.  Follow-Up: At [facility], you and your health needs are our priority.  As part of our continuing mission to provide you with exceptional heart care, we have created designated Provider Care Teams.  These Care Teams include your primary Cardiologist (  physician) and Advanced Practice Providers (APPs -  Physician Assistants and Nurse Practitioners) who all work together to provide you with the care you need, when you need it.  Your next appointment:   1 year(s)  The format  for your next appointment:   In Person  Provider:   Ozell Fell, MD {  Other Instructions You have been referred to [facility] to see Dr. Arthea Sharps, their office will call you to schedule an appointment.   Signed, Aleene Passe, MD  11/19/2023 5:06 PM    [facility]

## 2023-11-19 ENCOUNTER — Encounter: Payer: Self-pay | Admitting: Cardiovascular Disease

## 2023-11-19 ENCOUNTER — Ambulatory Visit: Payer: Federal, State, Local not specified - PPO | Attending: Cardiovascular Disease | Admitting: Cardiovascular Disease

## 2023-11-19 ENCOUNTER — Other Ambulatory Visit (HOSPITAL_BASED_OUTPATIENT_CLINIC_OR_DEPARTMENT_OTHER): Payer: Self-pay

## 2023-11-19 VITALS — BP 114/78 | HR 65 | Ht 66.0 in | Wt 160.6 lb

## 2023-11-19 DIAGNOSIS — I2510 Atherosclerotic heart disease of native coronary artery without angina pectoris: Secondary | ICD-10-CM

## 2023-11-19 DIAGNOSIS — M25551 Pain in right hip: Secondary | ICD-10-CM | POA: Diagnosis not present

## 2023-11-19 MED ORDER — PEPCID COMPLETE 10-800-165 MG PO CHEW: 1.0000 | CHEWABLE_TABLET | Freq: Every day | ORAL | Status: DC | PRN

## 2023-11-19 MED ORDER — PANTOPRAZOLE SODIUM 40 MG PO TBEC: 40.0000 mg | DELAYED_RELEASE_TABLET | Freq: Every day | ORAL | Status: DC | PRN

## 2023-11-19 NOTE — Patient Instructions (Signed)
 Medication Instructions:  Your physician has recommended you make the following change in your medication:   1) STOP aspirin  2) CHANGE pantoprazole  (Prontix) to as needed 3) START Pepcid  Complete daily as needed, can be purchased over the counter  *If you need a refill on your cardiac medications before your next appointment, please call your pharmacy*  Lab Work: None ordered today.  Testing/Procedures: None ordered today.  Follow-Up: At [facility], you and your health needs are our priority.  As part of our continuing mission to provide you with exceptional heart care, we have created designated Provider Care Teams.  These Care Teams include your primary Cardiologist (physician) and Advanced Practice Providers (APPs -  Physician Assistants and Nurse Practitioners) who all work together to provide you with the care you need, when you need it.  Your next appointment:   1 year(s)  The format for your next appointment:   In Person  Provider:   Ozell Fell, MD {  Other Instructions You have been referred to [facility] to see Dr. Arthea Sharps, their office will call you to schedule an appointment.

## 2023-11-20 ENCOUNTER — Other Ambulatory Visit (HOSPITAL_COMMUNITY): Payer: Self-pay

## 2023-11-20 ENCOUNTER — Other Ambulatory Visit: Payer: Self-pay | Admitting: Pharmacist Clinician (PhC)/ Clinical Pharmacy Specialist

## 2023-11-20 DIAGNOSIS — I2510 Atherosclerotic heart disease of native coronary artery without angina pectoris: Secondary | ICD-10-CM

## 2023-11-20 DIAGNOSIS — E782 Mixed hyperlipidemia: Secondary | ICD-10-CM

## 2023-11-20 MED ORDER — REPATHA SURECLICK 140 MG/ML ~~LOC~~ SOAJ
140.0000 mg | SUBCUTANEOUS | 3 refills | Status: DC
  Filled 2023-11-20 – 2023-11-22 (×2): qty 4, 56d supply, fill #0
  Filled 2024-02-03: qty 4, 56d supply, fill #1
  Filled 2024-03-17: qty 4, 56d supply, fill #2
  Filled 2024-05-04: qty 4, 56d supply, fill #3
  Filled 2024-07-27: qty 4, 56d supply, fill #4
  Filled 2024-09-16: qty 4, 56d supply, fill #5

## 2023-11-20 NOTE — Telephone Encounter (Signed)
Clinical questions answered. PA submitted

## 2023-11-21 ENCOUNTER — Other Ambulatory Visit (HOSPITAL_COMMUNITY): Payer: Self-pay

## 2023-11-21 ENCOUNTER — Other Ambulatory Visit (HOSPITAL_BASED_OUTPATIENT_CLINIC_OR_DEPARTMENT_OTHER): Payer: Self-pay

## 2023-11-22 ENCOUNTER — Other Ambulatory Visit: Payer: Self-pay

## 2023-11-22 ENCOUNTER — Other Ambulatory Visit (HOSPITAL_COMMUNITY): Payer: Self-pay

## 2023-11-22 ENCOUNTER — Other Ambulatory Visit (HOSPITAL_BASED_OUTPATIENT_CLINIC_OR_DEPARTMENT_OTHER): Payer: Self-pay

## 2023-11-22 NOTE — Telephone Encounter (Signed)
 [employer] Approval letter uploaded to patient chart. Authorization is valid from 10/22/23 through 11/20/24. Explanation letter mailed to patient

## 2023-12-20 DIAGNOSIS — N179 Acute kidney failure, unspecified: Secondary | ICD-10-CM | POA: Diagnosis not present

## 2023-12-23 NOTE — Progress Notes (Signed)
[facility] [address] [place] [address] Phone: [phone] Subjective:   Bruce Donath, am serving as a scribe for Dr. Antoine Primas.  I'm seeing this patient by the request  of:  Nahser MD  CC: Right hip pain  [id number]  Rodney Strickland is a 58 y.o. male coming in with complaint of R hip pain. Patient states that he has had pain in anterior aspect of hip for past 3 months. Pain worse when sleeping. Painful to flex hip when putting on socks, climbing stairs, and getting in and out of the car. Using Tylenol for pain relief.    Patient did have x-rays and this was independently visualized by me.  This showed the patient did have what appears to be early avascular necrosis noted.  Past Medical History:  Diagnosis Date   Coronary artery disease    Gout    Hypercholesteremia    Hypertension    Injury of both lungs 1986   water in lungs   Past Surgical History:  Procedure Laterality Date   CARDIAC CATHETERIZATION     CHOLECYSTECTOMY     CORONARY ARTERY BYPASS GRAFT N/A 01/05/2021   Procedure: CORONARY ARTERY BYPASS GRAFTING (CABG) TIMES FOUR, USING BILATERAL INTERNAL MAMMARY ARTERIES AND LEFT RADIAL ARTERY;  Surgeon: [name], MD;  Location: [facility] OR;  Service: Open Heart Surgery;  Laterality: N/A;   IR THORACENTESIS ASP PLEURAL SPACE W/IMG GUIDE  01/19/2021   LEFT HEART CATH AND CORONARY ANGIOGRAPHY N/A 12/29/2020   Procedure: LEFT HEART CATH AND CORONARY ANGIOGRAPHY;  Surgeon: Yvonne Kendall, MD;  [facility]: [facility] INVASIVE CV LAB;  Service: Cardiovascular;  Laterality: N/A;   LEFT HEART CATH AND CORS/GRAFTS ANGIOGRAPHY N/A 10/16/2022   Procedure: LEFT HEART CATH AND CORS/GRAFTS ANGIOGRAPHY;  Surgeon: Orbie Pyo, MD;  [facility]: [facility] INVASIVE CV LAB;  Service: Cardiovascular;  Laterality: N/A;   RADIAL ARTERY HARVEST Left 01/05/2021   Procedure: RADIAL ARTERY HARVEST;  Surgeon: [name], MD;  Location: [facility] OR;  Service: Open  Heart Surgery;  Laterality: Left;   RIGHT HEART CATH N/A 01/20/2021   Procedure: RIGHT HEART CATH;  Surgeon: Laurey Morale, MD;  Location: [facility] INVASIVE CV LAB;  Service: Cardiovascular;  Laterality: N/A;   TEE WITHOUT CARDIOVERSION N/A 01/05/2021   Procedure: TRANSESOPHAGEAL ECHOCARDIOGRAM (TEE);  Surgeon: [name], MD;  Location: [facility] OR;  Service: Open Heart Surgery;  Laterality: N/A;   THORACENTESIS     Social History   Socioeconomic History   Marital status: Married    Spouse name: Not on file   Number of children: 3   Years of education: Not on file   Highest education level: Not on file  Occupational History   Not on file  Tobacco Use   Smoking status: Never   Smokeless tobacco: Never  Vaping Use   Vaping status: Never Used  Substance and Sexual Activity   Alcohol use: No   Drug use: No   Sexual activity: Not on file  Other Topics Concern   Not on file  Social History Narrative   Not on file   Social Drivers of Health   Financial Resource Strain: Not on file  Food Insecurity: Not on file  Transportation Needs: Not on file  Physical Activity: Not on file  Stress: Not on file  Social Connections: Not on file   Allergies  Allergen Reactions   Dapsone     Other reaction(s): nausea and vomiting  Statins     Per patient's wife, he cannot have statin drugs   Family History  Problem Relation Age of Onset   Diabetes type II Mother    Hypertension Father    Stroke Father    Hypertension Sister     Current Outpatient Medications (Endocrine & Metabolic):    predniSONE (DELTASONE) 1 MG tablet, Take 4 tablets (4 mg total) by mouth daily.   predniSONE (DELTASONE) 10 MG tablet, Take 1 tablet (10 mg total) by mouth daily.   predniSONE (DELTASONE) 5 MG tablet, Take 5 mg by mouth daily.  Current Outpatient Medications (Cardiovascular):    Evolocumab (REPATHA SURECLICK) 140 MG/ML SOAJ, Inject 140 mg into the skin every 14 (fourteen) days.   icosapent Ethyl  (VASCEPA) 1 g capsule, Take 2 capsules (2 g total) by mouth 2 (two) times daily. With food   lisinopril (ZESTRIL) 10 MG tablet, Take 0.5 tablets (5 mg total) by mouth daily.   metoprolol tartrate (LOPRESSOR) 25 MG tablet, Take 0.5 tablets (12.5 mg total) by mouth 2 (two) times daily.   nitroGLYCERIN (NITROSTAT) 0.4 MG SL tablet, Place 1 tablet (0.4 mg total) under the tongue every 5 (five) minutes x 3 doses as needed for chest pain.   Current Outpatient Medications (Analgesics):    acetaminophen (TYLENOL) 325 MG tablet, Take 650 mg by mouth every 6 (six) hours as needed for headache.   allopurinol (ZYLOPRIM) 100 MG tablet, Take 0.5 tablets (50 mg total) by mouth daily.   allopurinol (ZYLOPRIM) 300 MG tablet, Take 0.5 tablets (150 mg total) by mouth daily.   colchicine 0.6 MG tablet, Take 1 tablet (0.6 mg total) by mouth daily.  Current Outpatient Medications (Hematological):    clopidogrel (PLAVIX) 75 MG tablet, Take 1 tablet (75 mg total) by mouth daily.  Current Outpatient Medications (Other):    famotidine-calcium carbonate-magnesium hydroxide (PEPCID COMPLETE) 10-800-165 MG chewable tablet, Chew 1 tablet by mouth daily as needed (acid reflux).   gabapentin (NEURONTIN) 100 MG capsule, Take 2 capsules (200 mg total) by mouth at bedtime.   meclizine (ANTIVERT) 25 MG tablet, Take 1 tablet (25 mg total) by mouth every 6 (six) hours as needed for 5 days.   pantoprazole (PROTONIX) 40 MG tablet, Take 1 tablet (40 mg total) by mouth daily as needed (acid reflux). Further refills to be approved by PCP   tacrolimus (PROGRAF) 1 MG capsule, Take 2 mg by mouth 2 (two) times daily.   tacrolimus (PROGRAF) 1 MG capsule, Take 2 capsules (2 mg total) by mouth 2 (two) times daily.   Vitamin D, Ergocalciferol, (DRISDOL) 1.25 MG (50000 UNIT) CAPS capsule, Take 1 capsule (50,000 Units total) by mouth every 7 (seven) days.   zinc gluconate 50 MG tablet, Take 50 mg by mouth daily.   tacrolimus (PROGRAF) 1 MG  capsule, Take 1 capsule (1 mg total) by mouth 2 (two) times daily. (Patient not taking: Reported on 11/19/2023)   Reviewed prior external information including notes and imaging from  primary care provider As well as notes that were available from care everywhere and other healthcare systems.  Past medical history, social, surgical and family history all reviewed in electronic medical record.  No pertanent information unless stated regarding to the chief complaint.   Review of Systems:  No headache, visual changes, nausea, vomiting, diarrhea, constipation, dizziness, abdominal pain, skin rash, fevers, chills, night sweats, weight loss, swollen lymph nodes,  joint swelling, chest pain, shortness of breath, mood changes. POSITIVE muscle aches, body aches  Objective  Blood pressure (!) 138/90, pulse 65, height 5\' 6"  (1.676 m), weight 162 lb (73.5 kg), SpO2 98%.   General: No apparent distress alert and oriented x3 mood and affect normal, dressed appropriately.  HEENT: Pupils equal, extraocular movements intact  Respiratory: Patient's speak in full sentences and does not appear short of breath  Cardiovascular: No lower extremity edema, non tender, no erythema  Right hip exam shows antalgic gait noted.   Limited muscular skeletal ultrasound was performed and interpreted by Antoine Primas, M patient does have significant narrowing of the the anterior femoral acetabular joint.  Patient does have cortical irregularities noted. No acute increase in Doppler flow.   Impression and Recommendations:     The above documentation has been reviewed and is accurate and complete Judi Saa, DO

## 2023-12-24 ENCOUNTER — Ambulatory Visit (INDEPENDENT_AMBULATORY_CARE_PROVIDER_SITE_OTHER): Payer: Federal, State, Local not specified - PPO | Admitting: Family Medicine

## 2023-12-24 ENCOUNTER — Encounter: Payer: Self-pay | Admitting: Family Medicine

## 2023-12-24 ENCOUNTER — Other Ambulatory Visit (HOSPITAL_BASED_OUTPATIENT_CLINIC_OR_DEPARTMENT_OTHER): Payer: Self-pay

## 2023-12-24 ENCOUNTER — Other Ambulatory Visit: Payer: Self-pay

## 2023-12-24 ENCOUNTER — Ambulatory Visit (INDEPENDENT_AMBULATORY_CARE_PROVIDER_SITE_OTHER): Payer: Federal, State, Local not specified - PPO

## 2023-12-24 VITALS — BP 138/90 | HR 65 | Ht 66.0 in | Wt 162.0 lb

## 2023-12-24 DIAGNOSIS — I700 Atherosclerosis of aorta: Secondary | ICD-10-CM | POA: Diagnosis not present

## 2023-12-24 DIAGNOSIS — M87051 Idiopathic aseptic necrosis of right femur: Secondary | ICD-10-CM | POA: Insufficient documentation

## 2023-12-24 DIAGNOSIS — M25551 Pain in right hip: Secondary | ICD-10-CM | POA: Diagnosis not present

## 2023-12-24 DIAGNOSIS — M545 Low back pain, unspecified: Secondary | ICD-10-CM | POA: Diagnosis not present

## 2023-12-24 DIAGNOSIS — M4187 Other forms of scoliosis, lumbosacral region: Secondary | ICD-10-CM | POA: Diagnosis not present

## 2023-12-24 DIAGNOSIS — M47816 Spondylosis without myelopathy or radiculopathy, lumbar region: Secondary | ICD-10-CM | POA: Diagnosis not present

## 2023-12-24 DIAGNOSIS — M160 Bilateral primary osteoarthritis of hip: Secondary | ICD-10-CM | POA: Diagnosis not present

## 2023-12-24 MED ORDER — VITAMIN D (ERGOCALCIFEROL) 1.25 MG (50000 UNIT) PO CAPS
50000.0000 [IU] | ORAL_CAPSULE | ORAL | 0 refills | Status: DC
  Filled 2023-12-24: qty 12, 84d supply, fill #0

## 2023-12-24 MED ORDER — GABAPENTIN 100 MG PO CAPS
200.0000 mg | ORAL_CAPSULE | Freq: Every day | ORAL | 0 refills | Status: AC
  Filled 2023-12-24: qty 180, 90d supply, fill #0

## 2023-12-24 NOTE — Patient Instructions (Addendum)
Once weekly Vit D Gabapentin 200mg  at bedtime Exercises  See me in 10-12 weeks

## 2023-12-24 NOTE — Assessment & Plan Note (Signed)
Patient's signs and symptoms consistent with avascular necrosis of the right hip.  Over read is pending to further evaluate.  We discussed at great length about even advanced imaging which at the moment did decline.  Will need to see how he does with home exercises, vitamin D and gabapentin.  Did discuss with patient that likely this will end with a possible hip replacement.  Patient wants to avoid that at this time.  Patient can continue to ambulate.  We did discuss that if any worsening symptoms we will need to consider referral to the orthopedic surgeon.  We discussed that if severe acute groin pain happens also needs to be seen immediately.  Patient is in agreement with the plan.  Will follow-up with me again in 2 to 3 months otherwise.

## 2023-12-25 ENCOUNTER — Other Ambulatory Visit (HOSPITAL_BASED_OUTPATIENT_CLINIC_OR_DEPARTMENT_OTHER): Payer: Self-pay

## 2024-01-05 ENCOUNTER — Other Ambulatory Visit: Payer: Self-pay | Admitting: Cardiovascular Disease

## 2024-01-05 MED FILL — Clopidogrel Bisulfate Tab 75 MG (Base Equiv): ORAL | 90 days supply | Qty: 90 | Fill #3 | Status: AC

## 2024-01-07 ENCOUNTER — Encounter: Payer: Self-pay | Admitting: Family Medicine

## 2024-01-08 ENCOUNTER — Other Ambulatory Visit (HOSPITAL_BASED_OUTPATIENT_CLINIC_OR_DEPARTMENT_OTHER): Payer: Self-pay

## 2024-01-14 DIAGNOSIS — N179 Acute kidney failure, unspecified: Secondary | ICD-10-CM | POA: Diagnosis not present

## 2024-01-16 DIAGNOSIS — E785 Hyperlipidemia, unspecified: Secondary | ICD-10-CM | POA: Diagnosis not present

## 2024-01-16 DIAGNOSIS — M109 Gout, unspecified: Secondary | ICD-10-CM | POA: Diagnosis not present

## 2024-01-16 DIAGNOSIS — N050 Unspecified nephritic syndrome with minor glomerular abnormality: Secondary | ICD-10-CM | POA: Diagnosis not present

## 2024-01-16 DIAGNOSIS — I10 Essential (primary) hypertension: Secondary | ICD-10-CM | POA: Diagnosis not present

## 2024-01-17 ENCOUNTER — Other Ambulatory Visit: Payer: Self-pay

## 2024-01-17 ENCOUNTER — Other Ambulatory Visit: Payer: Self-pay | Admitting: Family Medicine

## 2024-01-17 DIAGNOSIS — R931 Abnormal findings on diagnostic imaging of heart and coronary circulation: Secondary | ICD-10-CM

## 2024-01-17 DIAGNOSIS — I719 Aortic aneurysm of unspecified site, without rupture: Secondary | ICD-10-CM

## 2024-01-20 ENCOUNTER — Encounter: Payer: Self-pay | Admitting: Cardiovascular Disease

## 2024-01-20 ENCOUNTER — Other Ambulatory Visit (HOSPITAL_BASED_OUTPATIENT_CLINIC_OR_DEPARTMENT_OTHER): Payer: Self-pay

## 2024-01-20 ENCOUNTER — Telehealth: Payer: Self-pay | Admitting: Family Medicine

## 2024-01-20 ENCOUNTER — Telehealth: Payer: Self-pay | Admitting: Cardiovascular Disease

## 2024-01-20 NOTE — Telephone Encounter (Signed)
 Pt's spouse requesting a cb to discuss referral to sports med and the dx

## 2024-01-20 NOTE — Telephone Encounter (Signed)
 Pts wife called and was fairly aggressive asking for me to look in the pts chart.. she is on DPR... the pt saw Dr Ayesha Mohair for groin pain and he had reviewed the pts chart and he verbalized that the pt had a CT of  years ago and the pts "aorta was being monitored".... she also thought she heard he had an aneurysm which she says she never heard Dr Elease Hashimoto speak of...  she was asking me if DR Katrinka Blazing was looking in the wrong chart... I reassured her that he was reviewing the correct chart based on what she says that the opt did have a CT of the chest, abdomen, and pelvis back in 2018.... Dr. Katrinka Blazing had documented in the correct chart... but sh still asks for me to talk with DR Nahser to be sure the pt does not have an aneurysm or any cardiac concern outside of calcification and especially since Dr. Katrinka Blazing has ordered an US of the Aorta.... I wil send to Dr Elease Hashimoto.    Note: I spent 25 min on the phone with the pts wife today.

## 2024-01-20 NOTE — Telephone Encounter (Signed)
 Patient's wife called asking to speak to Dr Katrinka Blazing about recommendations and questions for the patient.   She can be reached at # [phone].

## 2024-01-20 NOTE — Telephone Encounter (Signed)
 Attempted to return call. Unable to leave VM as inbox was not set up.

## 2024-01-22 NOTE — Telephone Encounter (Signed)
Completed in MyChart encounter

## 2024-01-28 DIAGNOSIS — I129 Hypertensive chronic kidney disease with stage 1 through stage 4 chronic kidney disease, or unspecified chronic kidney disease: Secondary | ICD-10-CM | POA: Diagnosis not present

## 2024-01-28 DIAGNOSIS — N1831 Chronic kidney disease, stage 3a: Secondary | ICD-10-CM | POA: Diagnosis not present

## 2024-01-28 DIAGNOSIS — M1A3620 Chronic gout due to renal impairment, left knee, without tophus (tophi): Secondary | ICD-10-CM | POA: Diagnosis not present

## 2024-01-28 DIAGNOSIS — E782 Mixed hyperlipidemia: Secondary | ICD-10-CM | POA: Diagnosis not present

## 2024-02-03 ENCOUNTER — Other Ambulatory Visit (HOSPITAL_BASED_OUTPATIENT_CLINIC_OR_DEPARTMENT_OTHER): Payer: Self-pay

## 2024-02-05 ENCOUNTER — Other Ambulatory Visit (HOSPITAL_BASED_OUTPATIENT_CLINIC_OR_DEPARTMENT_OTHER): Payer: Self-pay

## 2024-02-17 DIAGNOSIS — N179 Acute kidney failure, unspecified: Secondary | ICD-10-CM | POA: Diagnosis not present

## 2024-02-24 ENCOUNTER — Ambulatory Visit (INDEPENDENT_AMBULATORY_CARE_PROVIDER_SITE_OTHER): Admitting: Orthopaedic Surgery

## 2024-02-24 DIAGNOSIS — M87051 Idiopathic aseptic necrosis of right femur: Secondary | ICD-10-CM

## 2024-02-24 NOTE — Progress Notes (Signed)
 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  He has known avascular necrosis of his right hip.  He has been having hip pain for about a year now it has been getting slightly worse.  He has been on chronic steroids in the past and has been the source of his AVN.  He also has a history of a CABG several years ago.  He is referred from Dr. Ronnell Coins from primary care sports medicine for further evaluation treatment of his right hip AVN.  He is also a patient of Dr. Richard Tisovec and Dr. Alroy Aspen from cardiology.  He is not on blood thinning medication.  He send and is not a diabetic.  I was able to review all of his past medical history and past medications with an epic.  He is on Plavix.  At this point his right hip pain is daily and it hurts in the groin.  He does walk with a limp.  It is detrimentally fighting his mobility, his quality of life and his activities of daily living.  He does have plain films of the system as well for us  to review.  On exam his left hip moves smoothly and fluidly and is asymptomatic.  His right hip has significant and severe pain with any internal and external rotation and there is significant stiffness with rotation.  I did go over the imaging studies with the patient and his wife.  There are deformities in the femoral head consistent with avascular necrosis.  There is slight femoral head collapse.  The left hip may have a small nidus of AVN but he is asymptomatic and this may be walled off.  We had a long and thorough discussion about hip replacement surgery.  I showed them a hip replacement model and went over the risks and benefits of the surgery.  They would like to wait to have surgery until after their daughter graduates at the end of May.  She would then be able to help out with his recovery.  He will need to stop Plavix 1 week prior to surgery.  For now I want him to go slow with his right hip  and even consider a cane in his opposite hand.  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  All questions and concerns were answered and addressed.  Will work on getting him scheduled for surgery in early June unless things rapidly worsen.

## 2024-03-04 ENCOUNTER — Other Ambulatory Visit (HOSPITAL_BASED_OUTPATIENT_CLINIC_OR_DEPARTMENT_OTHER): Payer: Self-pay

## 2024-03-11 ENCOUNTER — Telehealth: Payer: Self-pay

## 2024-03-11 NOTE — Telephone Encounter (Signed)
 Per previous conversation with Lynnie Saucier, patient's wife, she asked me to call her regarding surgery scheduling  I have called the number [phone] and no answer/voice mail not set up.  I called patient's number and left voice mail message for return call.

## 2024-03-17 ENCOUNTER — Other Ambulatory Visit (HOSPITAL_BASED_OUTPATIENT_CLINIC_OR_DEPARTMENT_OTHER): Payer: Self-pay

## 2024-03-17 ENCOUNTER — Other Ambulatory Visit: Payer: Self-pay

## 2024-03-17 MED ORDER — ALLOPURINOL 300 MG PO TABS
150.0000 mg | ORAL_TABLET | Freq: Every day | ORAL | 3 refills | Status: AC
  Filled 2024-03-17: qty 45, 90d supply, fill #0
  Filled 2024-06-16: qty 45, 90d supply, fill #1
  Filled 2024-09-16: qty 45, 90d supply, fill #2
  Filled 2024-11-30: qty 45, 90d supply, fill #3

## 2024-03-19 NOTE — Progress Notes (Deleted)
 Rodney Strickland [address] [place] [address] Phone: [phone] Subjective:    I'm seeing this patient by the request  of:  Tisovec, Rodney Pfeiffer, MD  CC:   [id number]  12/24/2023 Patient's signs and symptoms consistent with avascular necrosis of the right hip.  Over read is pending to further evaluate.  We discussed at great length about even advanced imaging which at the moment did decline.  Will need to see how he does with home exercises, vitamin D  and gabapentin .  Did discuss with patient that likely this will end with a possible hip replacement.  Patient wants to avoid that at this time.  Patient can continue to ambulate.  We did discuss that if any worsening symptoms we will need to consider referral to the orthopedic surgeon.  We discussed that if severe acute groin pain happens also needs to be seen immediately.  Patient is in agreement with the plan.  Will follow-up with me again in 2 to 3 months otherwise.     Updated 03/24/2024 ANTAEUS Strickland is a 58 y.o. male coming in with complaint of hip pain       Past Medical History:  Diagnosis Date   Coronary artery disease    Gout    Hypercholesteremia    Hypertension    Injury of both lungs 1986   water  in lungs   Past Surgical History:  Procedure Laterality Date   CARDIAC CATHETERIZATION     CHOLECYSTECTOMY     CORONARY ARTERY BYPASS GRAFT N/A 01/05/2021   Procedure: CORONARY ARTERY BYPASS GRAFTING (CABG) TIMES FOUR, USING BILATERAL INTERNAL MAMMARY ARTERIES AND LEFT RADIAL ARTERY;  Surgeon: Rudine Cos, MD;  Location: [facility] OR;  Service: Open Heart Surgery;  Laterality: N/A;   IR THORACENTESIS ASP PLEURAL SPACE W/IMG GUIDE  01/19/2021   LEFT HEART CATH AND CORONARY ANGIOGRAPHY N/A 12/29/2020   Procedure: LEFT HEART CATH AND CORONARY ANGIOGRAPHY;  Surgeon: Sammy Crisp, MD;  Location: [facility] INVASIVE CV LAB;  Service: Cardiovascular;  Laterality: N/A;   LEFT HEART CATH AND  CORS/GRAFTS ANGIOGRAPHY N/A 10/16/2022   Procedure: LEFT HEART CATH AND CORS/GRAFTS ANGIOGRAPHY;  Surgeon: Kyra Phy, MD;  Location: [facility] INVASIVE CV LAB;  Service: Cardiovascular;  Laterality: N/A;   RADIAL ARTERY HARVEST Left 01/05/2021   Procedure: RADIAL ARTERY HARVEST;  Surgeon: Rudine Cos, MD;  Location: [facility] OR;  Service: Open Heart Surgery;  Laterality: Left;   RIGHT HEART CATH N/A 01/20/2021   Procedure: RIGHT HEART CATH;  Surgeon: Darlis Eisenmenger, MD;  Location[facility] INVASIVE CV LAB;  Service: Cardiovascular;  Laterality: N/A;   TEE WITHOUT CARDIOVERSION N/A 01/05/2021   Procedure: TRANSESOPHAGEAL ECHOCARDIOGRAM (TEE);  Surgeon: Rudine Cos, MD;  Location: [facility] OR;  Service: Open Heart Surgery;  Laterality: N/A;   THORACENTESIS     Social History   Socioeconomic History   Marital status: Married    Spouse name: Not on file   Number of children: 3   Years of education: Not on file   Highest education level: Not on file  Occupational History   Not on file  Tobacco Use   Smoking status: Never   Smokeless tobacco: Never  Vaping Use   Vaping status: Never Used  Substance and Sexual Activity   Alcohol  use: No   Drug use: No   Sexual activity: Not on file  Other Topics Concern   Not on file  Social History Narrative   Not on file  Social Drivers of Corporate investment banker Strain: Not on file  Food Insecurity: Not on file  Transportation Needs: Not on file  Physical Activity: Not on file  Stress: Not on file  Social Connections: Not on file   Allergies  Allergen Reactions   Dapsone     Other reaction(s): nausea and vomiting   Statins     Per patient's wife, he cannot have statin drugs   Family History  Problem Relation Age of Onset   Diabetes type II Mother    Hypertension Father    Stroke Father    Hypertension Sister     Current Outpatient Medications (Endocrine & Metabolic):    predniSONE  (DELTASONE ) 1 MG tablet, Take 4 tablets (4 mg  total) by mouth daily.   predniSONE  (DELTASONE ) 10 MG tablet, Take 1 tablet (10 mg total) by mouth daily.   predniSONE  (DELTASONE ) 5 MG tablet, Take 5 mg by mouth daily.  Current Outpatient Medications (Cardiovascular):    Evolocumab  (REPATHA  SURECLICK) 140 MG/ML SOAJ, Inject 140 mg into the skin every 14 (fourteen) days.   icosapent  Ethyl (VASCEPA ) 1 g capsule, Take 2 capsules (2 g total) by mouth 2 (two) times daily. With food   lisinopril  (ZESTRIL ) 10 MG tablet, Take 0.5 tablets (5 mg total) by mouth daily.   metoprolol  tartrate (LOPRESSOR ) 25 MG tablet, Take 0.5 tablets (12.5 mg total) by mouth 2 (two) times daily.   nitroGLYCERIN  (NITROSTAT ) 0.4 MG SL tablet, Place 1 tablet (0.4 mg total) under the tongue every 5 (five) minutes x 3 doses as needed for chest pain.   Current Outpatient Medications (Analgesics):    acetaminophen  (TYLENOL ) 325 MG tablet, Take 650 mg by mouth every 6 (six) hours as needed for headache.   allopurinol  (ZYLOPRIM ) 100 MG tablet, Take 0.5 tablets (50 mg total) by mouth daily.   allopurinol  (ZYLOPRIM ) 300 MG tablet, Take 0.5 tablets (150 mg total) by mouth daily.   colchicine  0.6 MG tablet, Take 1 tablet (0.6 mg total) by mouth daily.  Current Outpatient Medications (Hematological):    clopidogrel  (PLAVIX ) 75 MG tablet, Take 1 tablet (75 mg total) by mouth daily.  Current Outpatient Medications (Other):    famotidine -calcium  carbonate-magnesium  hydroxide (PEPCID  COMPLETE) 10-800-165 MG chewable tablet, Chew 1 tablet by mouth daily as needed (acid reflux).   gabapentin  (NEURONTIN ) 100 MG capsule, Take 2 capsules (200 mg total) by mouth at bedtime.   meclizine  (ANTIVERT ) 25 MG tablet, Take 1 tablet (25 mg total) by mouth every 6 (six) hours as needed for 5 days.   pantoprazole  (PROTONIX ) 40 MG tablet, Take 1 tablet (40 mg total) by mouth daily as needed.   pantoprazole  (PROTONIX ) 40 MG tablet, Take 1 tablet (40 mg total) by mouth daily as needed (acid reflux).  Further refills to be approved by PCP   tacrolimus  (PROGRAF ) 1 MG capsule, Take 2 mg by mouth 2 (two) times daily.   tacrolimus  (PROGRAF ) 1 MG capsule, Take 2 capsules (2 mg total) by mouth 2 (two) times daily.   tacrolimus  (PROGRAF ) 1 MG capsule, Take 1 capsule (1 mg total) by mouth 2 (two) times daily. (Patient not taking: Reported on 11/19/2023)   Vitamin D , Ergocalciferol , (DRISDOL ) 1.25 MG (50000 UNIT) CAPS capsule, Take 1 capsule (50,000 Units total) by mouth every 7 (seven) days.   zinc  gluconate 50 MG tablet, Take 50 mg by mouth daily.   Reviewed prior external information including notes and imaging from  primary care provider As well as notes that  were available from care everywhere and other healthcare systems.  Past medical history, social, surgical and family history all reviewed in electronic medical record.  No pertanent information unless stated regarding to the chief complaint.   Review of Systems:  No headache, visual changes, nausea, vomiting, diarrhea, constipation, dizziness, abdominal pain, skin rash, fevers, chills, night sweats, weight loss, swollen lymph nodes, body aches, joint swelling, chest pain, shortness of breath, mood changes. POSITIVE muscle aches  Objective  There were no vitals taken for this visit.   General: No apparent distress alert and oriented x3 mood and affect normal, dressed appropriately.  HEENT: Pupils equal, extraocular movements intact  Respiratory: Patient's speak in full sentences and does not appear short of breath  Cardiovascular: No lower extremity edema, non tender, no erythema      Impression and Recommendations:

## 2024-03-24 ENCOUNTER — Ambulatory Visit: Payer: Federal, State, Local not specified - PPO | Admitting: Family Medicine

## 2024-03-30 ENCOUNTER — Other Ambulatory Visit: Payer: Self-pay | Admitting: Physician Assistant

## 2024-03-30 ENCOUNTER — Other Ambulatory Visit: Payer: Self-pay

## 2024-03-30 ENCOUNTER — Other Ambulatory Visit (HOSPITAL_BASED_OUTPATIENT_CLINIC_OR_DEPARTMENT_OTHER): Payer: Self-pay

## 2024-03-30 ENCOUNTER — Other Ambulatory Visit: Payer: Self-pay | Admitting: Cardiology

## 2024-03-30 MED ORDER — CLOPIDOGREL BISULFATE 75 MG PO TABS
75.0000 mg | ORAL_TABLET | Freq: Every day | ORAL | 3 refills | Status: AC
  Filled 2024-03-30: qty 90, 90d supply, fill #0
  Filled 2024-06-23: qty 90, 90d supply, fill #1
  Filled 2024-10-01: qty 90, 90d supply, fill #2
  Filled 2024-12-14: qty 90, 90d supply, fill #3

## 2024-03-30 MED ORDER — METOPROLOL TARTRATE 25 MG PO TABS
12.5000 mg | ORAL_TABLET | Freq: Two times a day (BID) | ORAL | 3 refills | Status: AC
  Filled 2024-03-30: qty 90, 90d supply, fill #0
  Filled 2024-04-21 – 2024-06-23 (×2): qty 90, 90d supply, fill #1
  Filled 2024-10-01: qty 90, 90d supply, fill #2
  Filled 2024-11-30 – 2024-12-14 (×2): qty 90, 90d supply, fill #3

## 2024-04-01 DIAGNOSIS — N179 Acute kidney failure, unspecified: Secondary | ICD-10-CM | POA: Diagnosis not present

## 2024-04-02 ENCOUNTER — Telehealth: Payer: Self-pay | Admitting: Pharmacy Technician

## 2024-04-02 ENCOUNTER — Telehealth: Payer: Self-pay | Admitting: Cardiovascular Disease

## 2024-04-02 ENCOUNTER — Other Ambulatory Visit (HOSPITAL_COMMUNITY): Payer: Self-pay

## 2024-04-02 NOTE — Telephone Encounter (Addendum)
 Pharmacy Patient Advocate Encounter   Received notification from Pt Calls Messages-CVD MAG that prior authorization for REPATHA  is required/requested.   Insurance verification completed.   The patient is insured through [facility] .   Per test claim: PA required; PA submitted to above mentioned insurance via CoverMyMeds Key/confirmation #/EOC [id number] Status is pending

## 2024-04-02 NOTE — Telephone Encounter (Signed)
 Pt's spouse calling to advise of PA for Repatha  is expiring and will need renewal

## 2024-04-02 NOTE — Telephone Encounter (Signed)
 Will route this to our PA team for further assistance and management of this call.

## 2024-04-03 NOTE — Telephone Encounter (Signed)
Lmom for patient and sent mychart message.

## 2024-04-20 ENCOUNTER — Encounter: Payer: Self-pay | Admitting: Cardiovascular Disease

## 2024-04-21 ENCOUNTER — Other Ambulatory Visit: Payer: Self-pay

## 2024-04-24 ENCOUNTER — Other Ambulatory Visit (HOSPITAL_BASED_OUTPATIENT_CLINIC_OR_DEPARTMENT_OTHER): Payer: Self-pay

## 2024-04-24 NOTE — Progress Notes (Signed)
 Surgical Instructions   Your procedure is scheduled on JUNE 24, 25. Report to [facility] Main Entrance A at 8:15 A.M., then check in with the Admitting office. Any questions or running late day of surgery: call [phone]  Questions prior to your surgery date: call [phone], Monday-Friday, 8am-4pm. If you experience any cold or flu symptoms such as cough, fever, chills, shortness of breath, etc. between now and your scheduled surgery, please notify us  at the above number.     Remember:  Do not eat after midnight the night before your surgery  You may drink clear liquids until 7:15  the morning of your surgery.   Clear liquids allowed are: Water , Non-Citrus Juices (without pulp), Carbonated Beverages, Clear Tea (no milk, honey, etc.), Black Coffee Only (NO MILK, CREAM OR POWDERED CREAMER of any kind), and Gatorade.     Patient Instructions  The night before surgery:  No food after midnight. ONLY clear liquids after midnight  The day of surgery (if you do NOT have diabetes):  Drink ONE (1) Pre-Surgery Clear Ensure by 7:15 the morning of surgery. Drink in one sitting. Do not sip.  This drink was given to you during your hospital  pre-op appointment visit.  Nothing else to drink after completing the  Pre-Surgery Clear Ensure.           If you have questions, please contact your surgeon's office.  Take these medicines the morning of surgery with A SIP OF WATER   allopurinol  (ZYLOPRIM )  colchicine   PEPCID  COMPLETE metoprolol  tartrate (LOPRESSOR )  predniSONE  (DELTASONE )  tacrolimus  (PROGRAF )   May take these medicines IF NEEDED: acetaminophen  (TYLENOL )  meclizine  (ANTIVERT )  nitroGLYCERIN  (NITROSTAT )  pantoprazole  (PROTONIX )   clopidogrel  (PLAVIX ) Stop one week prior to procedure. LAST DOSE 03-27-24  One week prior to surgery, STOP taking any Aspirin  (unless otherwise instructed by your surgeon) Aleve , Naproxen , Ibuprofen, Motrin, Advil, Goody's, BC's, all herbal  medications, fish oil, and non-prescription vitamins. zinc  gluconate                      Do NOT Smoke (Tobacco/Vaping) for 24 hours prior to your procedure.  If you use a CPAP at night, you may bring your mask/headgear for your overnight stay.   You will be asked to remove any contacts, glasses, piercing's, hearing aid's, dentures/partials prior to surgery. Please bring cases for these items if needed.    Patients discharged the day of surgery will not be allowed to drive home, and someone needs to stay with them for 24 hours.  SURGICAL WAITING ROOM VISITATION Patients may have no more than 2 support people in the waiting area - these visitors may rotate.   Pre-op nurse will coordinate an appropriate time for 1 ADULT support person, who may not rotate, to accompany patient in pre-op.  Children under the age of 49 must have an adult with them who is not the patient and must remain in the main waiting area with an adult.  If the patient needs to stay at the hospital during part of their recovery, the visitor guidelines for inpatient rooms apply.  Please refer to the [facility] website for the visitor guidelines for any additional information.   If you received a COVID test during your pre-op visit  it is requested that you wear a mask when out in public, stay away from anyone that may not be feeling well and notify your surgeon if you develop symptoms. If you have been in contact with  anyone that has tested positive in the last 10 days please notify you surgeon.      Pre-operative 5 CHG Bathing Instructions   You can play a key role in reducing the risk of infection after surgery. Your skin needs to be as free of germs as possible. You can reduce the number of germs on your skin by washing with CHG (chlorhexidine  gluconate) soap before surgery. CHG is an antiseptic soap that kills germs and continues to kill germs even after washing.   DO NOT use if you have an allergy to  chlorhexidine /CHG or antibacterial soaps. If your skin becomes reddened or irritated, stop using the CHG and notify one of our RNs at [phone].   Please shower with the CHG soap starting 4 days before surgery using the following schedule:     Please keep in mind the following:  DO NOT shave, including legs and underarms, starting the day of your first shower.   You may shave your face at any point before/day of surgery.  Place clean sheets on your bed the day you start using CHG soap. Use a clean washcloth (not used since being washed) for each shower. DO NOT sleep with pets once you start using the CHG.   CHG Shower Instructions:  Wash your face and private area with normal soap. If you choose to wash your hair, wash first with your normal shampoo.  After you use shampoo/soap, rinse your hair and body thoroughly to remove shampoo/soap residue.  Turn the water  OFF and apply about 3 tablespoons (45 ml) of CHG soap to a CLEAN washcloth.  Apply CHG soap ONLY FROM YOUR NECK DOWN TO YOUR TOES (washing for 3-5 minutes)  DO NOT use CHG soap on face, private areas, open wounds, or sores.  Pay special attention to the area where your surgery is being performed.  If you are having back surgery, having someone wash your back for you may be helpful. Wait 2 minutes after CHG soap is applied, then you may rinse off the CHG soap.  Pat dry with a clean towel  Put on clean clothes/pajamas   If you choose to wear lotion, please use ONLY the CHG-compatible lotions that are listed below.  Additional instructions for the day of surgery: DO NOT APPLY any lotions, deodorants, cologne, or perfumes.   Do not bring valuables to the hospital. [facility] is not responsible for any belongings/valuables. Do not wear nail polish, gel polish, artificial nails, or any other type of covering on natural nails (fingers and toes) Do not wear jewelry or makeup Put on clean/comfortable clothes.  Please brush your  teeth.  Ask your nurse before applying any prescription medications to the skin.     CHG Compatible Lotions   Aveeno Moisturizing lotion  Cetaphil Moisturizing Cream  Cetaphil Moisturizing Lotion  Clairol Herbal Essence Moisturizing Lotion, Dry Skin  Clairol Herbal Essence Moisturizing Lotion, Extra Dry Skin  Clairol Herbal Essence Moisturizing Lotion, Normal Skin  Curel Age Defying Therapeutic Moisturizing Lotion with Alpha Hydroxy  Curel Extreme Care Body Lotion  Curel Soothing Hands Moisturizing Hand Lotion  Curel Therapeutic Moisturizing Cream, Fragrance-Free  Curel Therapeutic Moisturizing Lotion, Fragrance-Free  Curel Therapeutic Moisturizing Lotion, Original Formula  Eucerin Daily Replenishing Lotion  Eucerin Dry Skin Therapy Plus Alpha Hydroxy Crme  Eucerin Dry Skin Therapy Plus Alpha Hydroxy Lotion  Eucerin Original Crme  Eucerin Original Lotion  Eucerin Plus Crme Eucerin Plus Lotion  Eucerin TriLipid Replenishing Lotion  Keri Anti-Bacterial Hand Lotion  Keri Deep Conditioning Original Lotion Dry Skin Formula Softly Scented  Keri Deep Conditioning Original Lotion, Fragrance Free Sensitive Skin Formula  Keri Lotion Fast Absorbing Fragrance Free Sensitive Skin Formula  Keri Lotion Fast Absorbing Softly Scented Dry Skin Formula  Keri Original Lotion  Keri Skin Renewal Lotion Keri Silky Smooth Lotion  Keri Silky Smooth Sensitive Skin Lotion  Nivea Body Creamy Conditioning Oil  Nivea Body Extra Enriched Lotion  Nivea Body Original Lotion  Nivea Body Sheer Moisturizing Lotion Nivea Crme  Nivea Skin Firming Lotion  NutraDerm 30 Skin Lotion  NutraDerm Skin Lotion  NutraDerm Therapeutic Skin Cream  NutraDerm Therapeutic Skin Lotion  ProShield Protective Hand Cream  Provon moisturizing lotion  Please read over the following fact sheets that you were given.

## 2024-04-27 ENCOUNTER — Encounter (HOSPITAL_COMMUNITY)
Admission: RE | Admit: 2024-04-27 | Discharge: 2024-04-27 | Disposition: A | Discharge status: Home / Self Care | Source: Ambulatory Visit | Attending: Orthopaedic Surgery | Admitting: Orthopaedic Surgery

## 2024-04-27 ENCOUNTER — Other Ambulatory Visit: Payer: Self-pay

## 2024-04-27 ENCOUNTER — Encounter (HOSPITAL_COMMUNITY): Payer: Self-pay

## 2024-04-27 VITALS — BP 106/67 | HR 79 | Temp 97.8°F | Resp 17 | Ht 66.0 in | Wt 158.9 lb

## 2024-04-27 DIAGNOSIS — Z01812 Encounter for preprocedural laboratory examination: Secondary | ICD-10-CM | POA: Insufficient documentation

## 2024-04-27 DIAGNOSIS — I10 Essential (primary) hypertension: Secondary | ICD-10-CM | POA: Insufficient documentation

## 2024-04-27 DIAGNOSIS — I252 Old myocardial infarction: Secondary | ICD-10-CM | POA: Diagnosis not present

## 2024-04-27 DIAGNOSIS — K219 Gastro-esophageal reflux disease without esophagitis: Secondary | ICD-10-CM | POA: Diagnosis not present

## 2024-04-27 DIAGNOSIS — Z79899 Other long term (current) drug therapy: Secondary | ICD-10-CM | POA: Diagnosis not present

## 2024-04-27 DIAGNOSIS — I2510 Atherosclerotic heart disease of native coronary artery without angina pectoris: Secondary | ICD-10-CM | POA: Diagnosis not present

## 2024-04-27 DIAGNOSIS — Z951 Presence of aortocoronary bypass graft: Secondary | ICD-10-CM | POA: Insufficient documentation

## 2024-04-27 DIAGNOSIS — Z7902 Long term (current) use of antithrombotics/antiplatelets: Secondary | ICD-10-CM | POA: Insufficient documentation

## 2024-04-27 DIAGNOSIS — M87051 Idiopathic aseptic necrosis of right femur: Secondary | ICD-10-CM | POA: Insufficient documentation

## 2024-04-27 DIAGNOSIS — Z01818 Encounter for other preprocedural examination: Secondary | ICD-10-CM

## 2024-04-27 HISTORY — DX: Unspecified nephritic syndrome with minor glomerular abnormality: N05.0

## 2024-04-27 LAB — TYPE AND SCREEN
ABO/RH(D): B POS
Antibody Screen: NEGATIVE

## 2024-04-27 LAB — BASIC METABOLIC PANEL WITH GFR
Anion gap: 8 (ref 5–15)
BUN: 22 mg/dL — ABNORMAL HIGH (ref 6–20)
CO2: 23 mmol/L (ref 22–32)
Calcium: 9.3 mg/dL (ref 8.9–10.3)
Chloride: 104 mmol/L (ref 98–111)
Creatinine, Ser: 1.21 mg/dL (ref 0.61–1.24)
GFR, Estimated: >60 mL/min (ref >60)
Glucose, Bld: 98 mg/dL (ref 70–99)
Potassium: 3.8 mmol/L (ref 3.5–5.1)
Sodium: 135 mmol/L (ref 135–145)

## 2024-04-27 LAB — CBC
HCT: 43.9 % (ref 39.0–52.0)
Hemoglobin: 14.9 g/dL (ref 13.0–17.0)
MCH: 31.3 pg (ref 26.0–34.0)
MCHC: 33.9 g/dL (ref 30.0–36.0)
MCV: 92.2 fL (ref 80.0–100.0)
Platelets: 250 10*3/uL (ref 150–400)
RBC: 4.76 MIL/uL (ref 4.22–5.81)
RDW: 12.4 % (ref 11.5–15.5)
WBC: 6.2 10*3/uL (ref 4.0–10.5)
nRBC: 0 % (ref 0.0–0.2)

## 2024-04-27 LAB — SURGICAL PCR SCREEN
MRSA, PCR: NEGATIVE
Staphylococcus aureus: POSITIVE — AB

## 2024-04-27 NOTE — Progress Notes (Addendum)
 Surgical Instructions   Your procedure is scheduled on JUNE 24, 25. Report to [facility] Main Entrance A at 8:15 A.M., then check in with the Admitting office. Any questions or running late day of surgery: call [phone]  Questions prior to your surgery date: call [phone], Monday-Friday, 8am-4pm. If you experience any cold or flu symptoms such as cough, fever, chills, shortness of breath, etc. between now and your scheduled surgery, please notify us  at the above number.     Remember:  Do not eat after midnight the night before your surgery  You may drink clear liquids until 7:15  the morning of your surgery.   Clear liquids allowed are: Water , Non-Citrus Juices (without pulp), Carbonated Beverages, Clear Tea (no milk, honey, etc.), Black Coffee Only (NO MILK, CREAM OR POWDERED CREAMER of any kind), and Gatorade.     Patient Instructions  The night before surgery:  No food after midnight. ONLY clear liquids after midnight  The day of surgery (if you do NOT have diabetes):  Drink ONE (1) Pre-Surgery Clear Ensure by 7:15 the morning of surgery. Drink in one sitting. Do not sip.  This drink was given to you during your hospital  pre-op appointment visit.  Nothing else to drink after completing the  Pre-Surgery Clear Ensure.           If you have questions, please contact your surgeon's office.   Take these medicines the morning of surgery with A SIP OF WATER   allopurinol  (ZYLOPRIM )  colchicine   metoprolol  tartrate (LOPRESSOR )    May take these medicines IF NEEDED: acetaminophen  (TYLENOL )  nitroGLYCERIN  (NITROSTAT ) - if you need to take this ,call [phone] before coming for surgery. pantoprazole  (PROTONIX )  colchicine    clopidogrel  (PLAVIX ) Stop one week prior to procedure. LAST DOSE 04-27-24  One week prior to surgery, STOP taking any Aspirin  (unless otherwise instructed by your surgeon) Aleve , Naproxen , Ibuprofen, Motrin, Advil, Goody's, BC's, all herbal  medications, fish oil, and non-prescription vitamins, zinc  gluconate.                     Do NOT Smoke (Tobacco/Vaping) for 24 hours prior to your procedure.  If you use a CPAP at night, you may bring your mask/headgear for your overnight stay.   You will be asked to remove any contacts, glasses, piercing's, hearing aid's, dentures/partials prior to surgery. Please bring cases for these items if needed.    Patients discharged the day of surgery will not be allowed to drive home, and someone needs to stay with them for 24 hours.  SURGICAL WAITING ROOM VISITATION Patients may have no more than 2 support people in the waiting area - these visitors may rotate.   Pre-op nurse will coordinate an appropriate time for 1 ADULT support person, who may not rotate, to accompany patient in pre-op.  Children under the age of 40 must have an adult with them who is not the patient and must remain in the main waiting area with an adult.  If the patient needs to stay at the hospital during part of their recovery, the visitor guidelines for inpatient rooms apply.  Please refer to the [facility] website for the visitor guidelines for any additional information.   If you received a COVID test during your pre-op visit  it is requested that you wear a mask when out in public, stay away from anyone that may not be feeling well and notify your surgeon if you develop symptoms. If you have  been in contact with anyone that has tested positive in the last 10 days please notify you surgeon.      Pre-operative 5 CHG Bathing Instructions   You can play a key role in reducing the risk of infection after surgery. Your skin needs to be as free of germs as possible. You can reduce the number of germs on your skin by washing with CHG (chlorhexidine  gluconate) soap before surgery. CHG is an antiseptic soap that kills germs and continues to kill germs even after washing.   DO NOT use if you have an allergy to  chlorhexidine /CHG or antibacterial soaps. If your skin becomes reddened or irritated, stop using the CHG and notify one of our RNs at [phone].   Please shower with the CHG soap starting 4 days before surgery using the following schedule:     Please keep in mind the following:  DO NOT shave, including legs and underarms, starting the day of your first shower.   You may shave your face at any point before/day of surgery.  Place clean sheets on your bed the day you start using CHG soap. Use a clean washcloth (not used since being washed) for each shower. DO NOT sleep with pets once you start using the CHG.   CHG Shower Instructions:  Wash your face and private area with normal soap. If you choose to wash your hair, wash first with your normal shampoo.  After you use shampoo/soap, rinse your hair and body thoroughly to remove shampoo/soap residue.  Turn the water  OFF and apply about 3 tablespoons (45 ml) of CHG soap to a CLEAN washcloth.  Apply CHG soap ONLY FROM YOUR NECK DOWN TO YOUR TOES (washing for 3-5 minutes)  DO NOT use CHG soap on face, private areas, open wounds, or sores.  Pay special attention to the area where your surgery is being performed.  If you are having back surgery, having someone wash your back for you may be helpful. Wait 2 minutes after CHG soap is applied, then you may rinse off the CHG soap.  Pat dry with a clean towel  Put on clean clothes/pajamas   If you choose to wear lotion, please use ONLY the CHG-compatible lotions that are listed below.  Additional instructions for the day of surgery: DO NOT APPLY any lotions, deodorants, cologne, or perfumes.   Do not bring valuables to the hospital. [facility] is not responsible for any belongings/valuables. Do not wear nail polish, gel polish, artificial nails, or any other type of covering on natural nails (fingers and toes) Do not wear jewelry or makeup Put on clean/comfortable clothes.  Please brush your  teeth.  Ask your nurse before applying any prescription medications to the skin.     CHG Compatible Lotions   Aveeno Moisturizing lotion  Cetaphil Moisturizing Cream  Cetaphil Moisturizing Lotion  Clairol Herbal Essence Moisturizing Lotion, Dry Skin  Clairol Herbal Essence Moisturizing Lotion, Extra Dry Skin  Clairol Herbal Essence Moisturizing Lotion, Normal Skin  Curel Age Defying Therapeutic Moisturizing Lotion with Alpha Hydroxy  Curel Extreme Care Body Lotion  Curel Soothing Hands Moisturizing Hand Lotion  Curel Therapeutic Moisturizing Cream, Fragrance-Free  Curel Therapeutic Moisturizing Lotion, Fragrance-Free  Curel Therapeutic Moisturizing Lotion, Original Formula  Eucerin Daily Replenishing Lotion  Eucerin Dry Skin Therapy Plus Alpha Hydroxy Crme  Eucerin Dry Skin Therapy Plus Alpha Hydroxy Lotion  Eucerin Original Crme  Eucerin Original Lotion  Eucerin Plus Crme Eucerin Plus Lotion  Eucerin TriLipid Replenishing Lotion  Keri  Anti-Bacterial Hand Lotion  Keri Deep Conditioning Original Lotion Dry Skin Formula Softly Scented  Keri Deep Conditioning Original Lotion, Fragrance Free Sensitive Skin Formula  Keri Lotion Fast Absorbing Fragrance Free Sensitive Skin Formula  Keri Lotion Fast Absorbing Softly Scented Dry Skin Formula  Keri Original Lotion  Keri Skin Renewal Lotion Keri Silky Smooth Lotion  Keri Silky Smooth Sensitive Skin Lotion  Nivea Body Creamy Conditioning Oil  Nivea Body Extra Enriched Lotion  Nivea Body Original Lotion  Nivea Body Sheer Moisturizing Lotion Nivea Crme  Nivea Skin Firming Lotion  NutraDerm 30 Skin Lotion  NutraDerm Skin Lotion  NutraDerm Therapeutic Skin Cream  NutraDerm Therapeutic Skin Lotion  ProShield Protective Hand Cream  Provon moisturizing lotion  Please read over the following fact sheets that you were given.

## 2024-04-27 NOTE — Progress Notes (Addendum)
 PCP - Rich Champ Tisovec,MD Cardiologist - Shirlee Dotter  PPM/ICD - denies Device Orders -  Rep Notified -   Chest x-ray - na EKG - 11/19/23 Stress Test -  ECHO - 10/16/22 Cardiac Cath - 10/16/22  Sleep Study - denies CPAP -   Fasting Blood Sugar - na Checks Blood Sugar _____ times a day  Last dose of GLP1 agonist- na  GLP1 instructions:   Blood Thinner Instructions: Hold one week prior to surgery per Dr. Arvella Bird instructions. Last dose will be 04/27/24 Aspirin  Instructions:na  ERAS Protcol -clear liquids until 0715 PRE-SURGERY Ensure or G2- Ensure  COVID TEST- na   Anesthesia review: yes-hx CABG,CAD,HTN,LO V with cardiology 11/19/23.  Patient denies shortness of breath, fever, cough and chest pain at PAT appointment   All instructions explained to the patient, with a verbal understanding of the material. Patient agrees to go over the instructions while at home for a better understanding.  The opportunity to ask questions was provided.

## 2024-04-28 NOTE — Anesthesia Preprocedure Evaluation (Addendum)
 Anesthesia Evaluation  Patient identified by MRN, date of birth, ID band Patient awake    Reviewed: Allergy & Precautions, NPO status , Patient's Chart, lab work & pertinent test results, reviewed documented beta blocker date and time   Airway Mallampati: II  TM Distance: >3 FB Neck ROM: Full    Dental  (+) Teeth Intact, Dental Advisory Given   Pulmonary neg pulmonary ROS   Pulmonary exam normal breath sounds clear to auscultation       Cardiovascular hypertension, Pt. on home beta blockers and Pt. on medications + CAD, + Past MI and + CABG  Normal cardiovascular exam Rhythm:Regular Rate:Normal     Neuro/Psych negative neurological ROS  negative psych ROS   GI/Hepatic Neg liver ROS,GERD  Medicated,,  Endo/Other  negative endocrine ROS    Renal/GU Renal InsufficiencyRenal disease     Musculoskeletal negative musculoskeletal ROS (+)  avascular necrosis right hip   Abdominal   Peds  Hematology  (+) Blood dyscrasia (Plavix )   Anesthesia Other Findings Day of surgery medications reviewed with the patient.  Reproductive/Obstetrics                             Anesthesia Physical Anesthesia Plan  ASA: 3  Anesthesia Plan: Spinal   Post-op Pain Management: Tylenol  PO (pre-op)* and Toradol  IV (intra-op)*   Induction: Intravenous  PONV Risk Score and Plan: 2 and Midazolam , TIVA, Dexamethasone and Ondansetron   Airway Management Planned: Natural Airway and Simple Face Mask  Additional Equipment:   Intra-op Plan:   Post-operative Plan:   Informed Consent: I have reviewed the patients History and Physical, chart, labs and discussed the procedure including the risks, benefits and alternatives for the proposed anesthesia with the patient or authorized representative who has indicated his/her understanding and acceptance.     Dental advisory given  Plan Discussed with: CRNA  Anesthesia  Plan Comments: (PAT note by Lynwood Hope, PA-C: 58 year old male follows with cardiology for history of HTN, CAD s/p CABG x 4 in 01/05/2021.  NSTEMI 10/2022, cath 10/16/2022 showed patent LIMA to LAD, RIMA to PDA, and radial sequential to OM1 and OM 3 - medical therapy recommended.  Echo 10/16/2022 showed EF 60 to 65%, grade 1 DD, normal RV, no significant valvular abnormalities.  Last seen by Dr. Alveta on 11/19/2023.  Stable at that time from cardiac standpoint, BP well-controlled, no changes to management, recommend 1 year follow-up.  He also referred patient to sports medicine for evaluation of hip pain.  Other pertinent hx includes GERD on PPI, minimal change disease.  Patient reports last dose Plavix  04/27/2024.  Preop labs reviewed, unremarkable.   EKG 11/19/2023: Normal sinus rhythm.  Rate 62. Incomplete right bundle branch block.  Cath 10/16/2022: 1.  Patent LIMA to LAD, RIMA to PDA, and radial sequential to OM1 to OM 3. 2.  LVEDP of 11 mmHg.  Recommendation: Medical therapy.  TTE 10/16/2022: 1. Left ventricular ejection fraction, by estimation, is 60 to 65%. The  left ventricle has normal function. The left ventricle has no regional  wall motion abnormalities. Left ventricular diastolic parameters are  consistent with Grade I diastolic  dysfunction (impaired relaxation).  2. Right ventricular systolic function is normal. The right ventricular  size is normal. There is normal pulmonary artery systolic pressure. The  estimated right ventricular systolic pressure is 17.7 mmHg.  3. The mitral valve is grossly normal. Trivial mitral valve  regurgitation. No evidence of mitral stenosis.  4. The aortic valve is tricuspid. Aortic valve regurgitation is trivial.  No aortic stenosis is present.  5. The inferior vena cava is normal in size with greater than 50%  respiratory variability, suggesting right atrial pressure of 3 mmHg.   )        Anesthesia Quick Evaluation

## 2024-04-28 NOTE — Progress Notes (Addendum)
 Anesthesia Chart Review:  58 year old male follows with cardiology for history of HTN, CAD s/p CABG x 4 in 01/05/2021.  NSTEMI 10/2022, cath 10/16/2022 showed patent LIMA to LAD, RIMA to PDA, and radial sequential to OM1 and OM 3 - medical therapy recommended.  Echo 10/16/2022 showed EF 60 to 65%, grade 1 DD, normal RV, no significant valvular abnormalities.  Last seen by Dr. Alroy Aspen on 11/19/2023.  Stable at that time from cardiac standpoint, BP well-controlled, no changes to management, recommend 1 year follow-up.  He also referred patient to sports medicine for evaluation of hip pain.  Other pertinent hx includes GERD on PPI, minimal change disease.  Patient reports last dose Plavix  04/27/2024.  Preop labs reviewed, unremarkable.   EKG 11/19/2023: Normal sinus rhythm.  Rate 62. Incomplete right bundle branch block.  Cath 10/16/2022: 1.  Patent LIMA to LAD, RIMA to PDA, and radial sequential to OM1 to OM 3. 2.  LVEDP of 11 mmHg.   Recommendation: Medical therapy.  TTE 10/16/2022: 1. Left ventricular ejection fraction, by estimation, is 60 to 65%. The  left ventricle has normal function. The left ventricle has no regional  wall motion abnormalities. Left ventricular diastolic parameters are  consistent with Grade I diastolic  dysfunction (impaired relaxation).   2. Right ventricular systolic function is normal. The right ventricular  size is normal. There is normal pulmonary artery systolic pressure. The  estimated right ventricular systolic pressure is 17.7 mmHg.   3. The mitral valve is grossly normal. Trivial mitral valve  regurgitation. No evidence of mitral stenosis.   4. The aortic valve is tricuspid. Aortic valve regurgitation is trivial.  No aortic stenosis is present.   5. The inferior vena cava is normal in size with greater than 50%  respiratory variability, suggesting right atrial pressure of 3 mmHg.     Edilia Gordon [facility]/Anesthesiology Phone [phone] 04/28/2024 2:01 PM

## 2024-04-29 ENCOUNTER — Telehealth: Payer: Self-pay

## 2024-04-29 NOTE — Telephone Encounter (Signed)
 Patients wife called and got transferred to Triage, she has questions about medication and anticoagulants. Wife was tying to speak to [employer]. I spoke to [place] and she advised she would handle patients questions. Patients wife number is [phone], number given to Chandra Come to handle questions. Just an FYI to advise of call.

## 2024-04-30 NOTE — Telephone Encounter (Signed)
 I spoke to patient's wife and advised.

## 2024-05-04 NOTE — H&P (Addendum)
 TOTAL HIP ADMISSION H&P  Patient is admitted for right total hip arthroplasty.  Subjective:  Chief Complaint: right hip pain  HPI: Rodney Strickland, 58 y.o. male, has a history of pain and functional disability in the right hip(s) due to avascular necrosis and patient has failed non-surgical conservative treatments for greater than 12 weeks to include activity modification.  Onset of symptoms was abrupt starting less than a year ago with rapidlly worsening course since that time.The patient noted no past surgery on the right hip(s).  Patient currently rates pain in the right hip at 10 out of 10 with activity. Patient has night pain, worsening of pain with activity and weight bearing, trendelenberg gait, pain that interfers with activities of daily living, and pain with passive range of motion. Patient has evidence of subchondral cysts, joint space narrowing, and AVN by imaging studies. This condition presents safety issues increasing the risk of falls. This patient has had avascular necrosis of the hip, acetabular fracture, hip dysplasia.  There is no current active infection.  Patient Active Problem List   Diagnosis Date Noted   Avascular necrosis of bone of hip, right (HCC) 12/24/2023   NSTEMI (non-ST elevated myocardial infarction) (HCC) 10/16/2022   Hypertension 10/25/2021   Hyperlipidemia 06/15/2021   CAD (coronary artery disease) 03/07/2021   Sternal wound infection 03/07/2021   Failure to thrive in adult 01/17/2021   AKI (acute kidney injury) (HCC) 01/17/2021   Hyperkalemia 01/17/2021   S/P CABG x 4 01/05/2021   Abnormal cardiac CT angiography 12/29/2020   Unstable angina (HCC) 12/08/2020   Right shoulder pain 11/21/2016   Past Medical History:  Diagnosis Date   Coronary artery disease    Gout    Hypercholesteremia    Hypertension    Injury of both lungs 1986   water  in lungs   Minimal change disease     Past Surgical History:  Procedure Laterality Date   CARDIAC  CATHETERIZATION     CHOLECYSTECTOMY     CORONARY ARTERY BYPASS GRAFT N/A 01/05/2021   Procedure: CORONARY ARTERY BYPASS GRAFTING (CABG) TIMES FOUR, USING BILATERAL INTERNAL MAMMARY ARTERIES AND LEFT RADIAL ARTERY;  Surgeon: German Bartlett PEDLAR, MD;  Location: [facility] OR;  Service: Open Heart Surgery;  Laterality: N/A;   IR THORACENTESIS ASP PLEURAL SPACE W/IMG GUIDE  01/19/2021   LEFT HEART CATH AND CORONARY ANGIOGRAPHY N/A 12/29/2020   Procedure: LEFT HEART CATH AND CORONARY ANGIOGRAPHY;  Surgeon: Mady Bruckner, MD;  Location: [facility] INVASIVE CV LAB;  Service: Cardiovascular;  Laterality: N/A;   LEFT HEART CATH AND CORS/GRAFTS ANGIOGRAPHY N/A 10/16/2022   Procedure: LEFT HEART CATH AND CORS/GRAFTS ANGIOGRAPHY;  Surgeon: Wendel Lurena POUR, MD;  Location: [facility] INVASIVE CV LAB;  Service: Cardiovascular;  Laterality: N/A;   RADIAL ARTERY HARVEST Left 01/05/2021   Procedure: RADIAL ARTERY HARVEST;  Surgeon: German Bartlett PEDLAR, MD;  Location: [facility] OR;  Service: Open Heart Surgery;  Laterality: Left;   RIGHT HEART CATH N/A 01/20/2021   Procedure: RIGHT HEART CATH;  Surgeon: Rolan Ezra RAMAN, MD;  Location: [facility] INVASIVE CV LAB;  Service: Cardiovascular;  Laterality: N/A;   TEE WITHOUT CARDIOVERSION N/A 01/05/2021   Procedure: TRANSESOPHAGEAL ECHOCARDIOGRAM (TEE);  Surgeon: German Bartlett PEDLAR, MD;  Location: [facility] OR;  Service: Open Heart Surgery;  Laterality: N/A;   THORACENTESIS      No current facility-administered medications for this encounter.   Current Outpatient Medications  Medication Sig Dispense Refill Last Dose/Taking   acetaminophen  (TYLENOL ) 325 MG tablet Take 650 mg  by mouth every 6 (six) hours as needed for headache.   Taking As Needed   allopurinol  (ZYLOPRIM ) 300 MG tablet Take 0.5 tablets (150 mg total) by mouth daily. 90 tablet 3 Taking   ascorbic acid  (VITAMIN C ) 500 MG tablet Take 500 mg by mouth daily.   Taking   calcium  carbonate (TUMS - DOSED IN MG ELEMENTAL CALCIUM ) 500 MG chewable tablet Chew 2  tablets by mouth as needed for indigestion or heartburn.   Taking As Needed   clopidogrel  (PLAVIX ) 75 MG tablet Take 1 tablet (75 mg total) by mouth daily. 90 tablet 3 Taking   colchicine  0.6 MG tablet Take 1 tablet (0.6 mg total) by mouth daily. (Patient taking differently: Take 0.6 mg by mouth as needed.) 30 tablet 2 Taking Differently   Evolocumab  (REPATHA  SURECLICK) 140 MG/ML SOAJ Inject 140 mg into the skin every 14 (fourteen) days. 6 mL 3 Taking   gabapentin  (NEURONTIN ) 100 MG capsule Take 2 capsules (200 mg total) by mouth at bedtime. (Patient taking differently: Take 100 mg by mouth as needed.) 180 capsule 0 Taking Differently   icosapent  Ethyl (VASCEPA ) 1 g capsule Take 2 g by mouth 2 (two) times daily.   Taking   lisinopril  (ZESTRIL ) 10 MG tablet Take 0.5 tablets by mouth daily.   Taking   Loratadine  (CLARITIN  PO) Take 1 tablet by mouth as needed.   Taking As Needed   metoprolol  tartrate (LOPRESSOR ) 25 MG tablet Take 0.5 tablets (12.5 mg total) by mouth 2 (two) times daily. 90 tablet 3 Taking   nitroGLYCERIN  (NITROSTAT ) 0.4 MG SL tablet Place 1 tablet (0.4 mg total) under the tongue every 5 (five) minutes x 3 doses as needed for chest pain. 25 tablet 1 Taking As Needed   pantoprazole  (PROTONIX ) 40 MG tablet Take 1 tablet (40 mg total) by mouth daily as needed. 90 tablet 3 Taking As Needed   Allergies  Allergen Reactions   Dapsone     Other reaction(s): nausea and vomiting   Statins     Per patient's wife, he cannot have statin drugs    Social History   Tobacco Use   Smoking status: Never   Smokeless tobacco: Never  Substance Use Topics   Alcohol  use: No    Family History  Problem Relation Age of Onset   Diabetes type II Mother    Hypertension Father    Stroke Father    Hypertension Sister      Review of Systems  Objective:  Physical Exam Vitals reviewed.  Constitutional:      Appearance: Normal appearance. He is normal weight.  HENT:     Head: Normocephalic and  atraumatic.   Eyes:     Extraocular Movements: Extraocular movements intact.     Pupils: Pupils are equal, round, and reactive to light.    Cardiovascular:     Rate and Rhythm: Normal rate.  Pulmonary:     Effort: Pulmonary effort is normal.     Breath sounds: Normal breath sounds.  Abdominal:     Palpations: Abdomen is soft.   Musculoskeletal:     Cervical back: Normal range of motion and neck supple.     Right hip: Tenderness and bony tenderness present. Decreased range of motion. Decreased strength.   Neurological:     Mental Status: He is alert and oriented to person, place, and time.   Psychiatric:        Behavior: Behavior normal.     Vital signs in last 24  hours:    Labs:   Estimated body mass index is 25.65 kg/m as calculated from the following:   Height as of 04/27/24: 5' 6 (1.676 m).   Weight as of 04/27/24: 72.1 kg.   Imaging Review Plain radiographs demonstrate severe AVN of the right hip(s). The bone quality appears to be good for age and reported activity level.      Assessment/Plan:  Avascular necrosis, right hip(s)  The patient history, physical examination, clinical judgement of the provider and imaging studies are consistent with end stage AVN of the right hip(s) and total hip arthroplasty is deemed medically necessary. The treatment options including medical management, injection therapy, arthroscopy and arthroplasty were discussed at length. The risks and benefits of total hip arthroplasty were presented and reviewed. The risks due to aseptic loosening, infection, stiffness, dislocation/subluxation,  thromboembolic complications and other imponderables were discussed.  The patient acknowledged the explanation, agreed to proceed with the plan and consent was signed. Patient is being admitted for inpatient treatment for surgery, pain control, PT, OT, prophylactic antibiotics, VTE prophylaxis, progressive ambulation and ADL's and discharge  planning.The patient is planning to be discharged home with home health services

## 2024-05-05 ENCOUNTER — Encounter (HOSPITAL_COMMUNITY)
Admission: RE | Disposition: A | Discharge status: Home / Self Care | Payer: Self-pay | Source: Home / Self Care | Attending: Orthopaedic Surgery

## 2024-05-05 ENCOUNTER — Ambulatory Visit (HOSPITAL_COMMUNITY): Payer: Self-pay | Admitting: Anesthesiology

## 2024-05-05 ENCOUNTER — Encounter (HOSPITAL_COMMUNITY): Payer: Self-pay | Admitting: Orthopaedic Surgery

## 2024-05-05 ENCOUNTER — Ambulatory Visit (HOSPITAL_COMMUNITY): Payer: Self-pay | Admitting: Physician Assistant

## 2024-05-05 ENCOUNTER — Other Ambulatory Visit: Payer: Self-pay

## 2024-05-05 ENCOUNTER — Ambulatory Visit (HOSPITAL_COMMUNITY)

## 2024-05-05 ENCOUNTER — Observation Stay (HOSPITAL_COMMUNITY)

## 2024-05-05 ENCOUNTER — Observation Stay (HOSPITAL_COMMUNITY)
Admission: RE | Admit: 2024-05-05 | Discharge: 2024-05-06 | Disposition: A | Discharge status: Home / Self Care | Attending: Orthopaedic Surgery | Admitting: Orthopaedic Surgery

## 2024-05-05 ENCOUNTER — Other Ambulatory Visit (HOSPITAL_BASED_OUTPATIENT_CLINIC_OR_DEPARTMENT_OTHER): Payer: Self-pay

## 2024-05-05 DIAGNOSIS — M87051 Idiopathic aseptic necrosis of right femur: Secondary | ICD-10-CM | POA: Diagnosis not present

## 2024-05-05 DIAGNOSIS — Z96641 Presence of right artificial hip joint: Secondary | ICD-10-CM | POA: Diagnosis not present

## 2024-05-05 DIAGNOSIS — M87851 Other osteonecrosis, right femur: Secondary | ICD-10-CM | POA: Diagnosis not present

## 2024-05-05 DIAGNOSIS — Z7901 Long term (current) use of anticoagulants: Secondary | ICD-10-CM | POA: Insufficient documentation

## 2024-05-05 DIAGNOSIS — I2510 Atherosclerotic heart disease of native coronary artery without angina pectoris: Secondary | ICD-10-CM | POA: Diagnosis not present

## 2024-05-05 DIAGNOSIS — M1611 Unilateral primary osteoarthritis, right hip: Secondary | ICD-10-CM | POA: Diagnosis not present

## 2024-05-05 DIAGNOSIS — I10 Essential (primary) hypertension: Secondary | ICD-10-CM | POA: Insufficient documentation

## 2024-05-05 DIAGNOSIS — M25551 Pain in right hip: Secondary | ICD-10-CM | POA: Diagnosis not present

## 2024-05-05 DIAGNOSIS — Z471 Aftercare following joint replacement surgery: Secondary | ICD-10-CM | POA: Diagnosis not present

## 2024-05-05 SURGERY — ARTHROPLASTY, HIP, TOTAL, ANTERIOR APPROACH
Anesthesia: Spinal | Site: Hip | Laterality: Right

## 2024-05-05 MED ORDER — CHLORHEXIDINE GLUCONATE 4 % EX SOLN
1.0000 | CUTANEOUS | 1 refills | Status: AC
  Filled 2024-05-05: qty 946, 60d supply, fill #0

## 2024-05-05 MED ORDER — TRANEXAMIC ACID-NACL 1000-0.7 MG/100ML-% IV SOLN
INTRAVENOUS | Status: AC
  Filled 2024-05-05: qty 100

## 2024-05-05 MED ORDER — OXYCODONE HCL 5 MG PO TABS
5.0000 mg | ORAL_TABLET | ORAL | Status: DC | PRN
  Administered 2024-05-05 – 2024-05-06 (×2): 10 mg via ORAL
  Filled 2024-05-05 (×4): qty 2

## 2024-05-05 MED ORDER — PANTOPRAZOLE SODIUM 40 MG PO TBEC
40.0000 mg | DELAYED_RELEASE_TABLET | Freq: Every day | ORAL | Status: DC
  Administered 2024-05-05 – 2024-05-06 (×2): 40 mg via ORAL
  Filled 2024-05-05 (×2): qty 1

## 2024-05-05 MED ORDER — PROPOFOL 10 MG/ML IV BOLUS
INTRAVENOUS | Status: DC | PRN
  Administered 2024-05-05: 20 mg via INTRAVENOUS
  Administered 2024-05-05: 30 mg via INTRAVENOUS

## 2024-05-05 MED ORDER — ONDANSETRON HCL 4 MG PO TABS: 4.0000 mg | ORAL_TABLET | Freq: Four times a day (QID) | ORAL | Status: DC | PRN

## 2024-05-05 MED ORDER — POVIDONE-IODINE 10 % EX SWAB: 2.0000 | Freq: Once | CUTANEOUS | Status: DC

## 2024-05-05 MED ORDER — VITAMIN C 500 MG PO TABS
500.0000 mg | ORAL_TABLET | Freq: Every day | ORAL | Status: DC
  Administered 2024-05-05 – 2024-05-06 (×2): 500 mg via ORAL
  Filled 2024-05-05 (×2): qty 1

## 2024-05-05 MED ORDER — KETOROLAC TROMETHAMINE 15 MG/ML IJ SOLN
INTRAMUSCULAR | Status: DC | PRN
  Administered 2024-05-05: 15 mg via INTRAVENOUS

## 2024-05-05 MED ORDER — ONDANSETRON HCL 4 MG/2ML IJ SOLN
INTRAMUSCULAR | Status: DC | PRN
  Administered 2024-05-05: 4 mg via INTRAVENOUS

## 2024-05-05 MED ORDER — CEFAZOLIN SODIUM-DEXTROSE 2-4 GM/100ML-% IV SOLN
2.0000 g | INTRAVENOUS | Status: AC
  Administered 2024-05-05: 2 g via INTRAVENOUS

## 2024-05-05 MED ORDER — CEFAZOLIN SODIUM-DEXTROSE 2-4 GM/100ML-% IV SOLN
INTRAVENOUS | Status: AC
  Filled 2024-05-05: qty 100

## 2024-05-05 MED ORDER — PROPOFOL 10 MG/ML IV BOLUS
INTRAVENOUS | Status: AC
Start: 2024-05-05 — End: 2024-05-05
  Filled 2024-05-05: qty 20

## 2024-05-05 MED ORDER — MUPIROCIN 2 % EX OINT
1.0000 | TOPICAL_OINTMENT | Freq: Two times a day (BID) | CUTANEOUS | 0 refills | Status: AC
  Filled 2024-05-05: qty 66, 33d supply, fill #0

## 2024-05-05 MED ORDER — FENTANYL CITRATE (PF) 250 MCG/5ML IJ SOLN
INTRAMUSCULAR | Status: AC
  Filled 2024-05-05: qty 5

## 2024-05-05 MED ORDER — FENTANYL CITRATE (PF) 100 MCG/2ML IJ SOLN: 25.0000 ug | INTRAMUSCULAR | Status: DC | PRN

## 2024-05-05 MED ORDER — SODIUM CHLORIDE 0.9 % IR SOLN
Status: DC | PRN
  Administered 2024-05-05: 1000 mL

## 2024-05-05 MED ORDER — DEXAMETHASONE SODIUM PHOSPHATE 10 MG/ML IJ SOLN
INTRAMUSCULAR | Status: DC | PRN
  Administered 2024-05-05: 10 mg via INTRAVENOUS

## 2024-05-05 MED ORDER — ASPIRIN 81 MG PO TBEC
81.0000 mg | DELAYED_RELEASE_TABLET | Freq: Every day | ORAL | Status: DC
  Administered 2024-05-05 – 2024-05-06 (×2): 81 mg via ORAL
  Filled 2024-05-05 (×2): qty 1

## 2024-05-05 MED ORDER — METOCLOPRAMIDE HCL 5 MG PO TABS: 5.0000 mg | ORAL_TABLET | Freq: Three times a day (TID) | ORAL | Status: DC | PRN

## 2024-05-05 MED ORDER — METOPROLOL TARTRATE 12.5 MG HALF TABLET
12.5000 mg | ORAL_TABLET | Freq: Two times a day (BID) | ORAL | Status: DC
  Administered 2024-05-05 – 2024-05-06 (×2): 12.5 mg via ORAL
  Filled 2024-05-05 (×2): qty 1

## 2024-05-05 MED ORDER — ALBUMIN HUMAN 5 % IV SOLN
INTRAVENOUS | Status: AC
  Filled 2024-05-05: qty 250

## 2024-05-05 MED ORDER — ALLOPURINOL 300 MG PO TABS
150.0000 mg | ORAL_TABLET | Freq: Every day | ORAL | Status: DC
  Administered 2024-05-05 – 2024-05-06 (×2): 150 mg via ORAL
  Filled 2024-05-05 (×2): qty 1

## 2024-05-05 MED ORDER — PHENYLEPHRINE HCL-NACL 20-0.9 MG/250ML-% IV SOLN
INTRAVENOUS | Status: DC | PRN
  Administered 2024-05-05: 160 ug via INTRAVENOUS
  Administered 2024-05-05: 30 ug/min via INTRAVENOUS

## 2024-05-05 MED ORDER — METHOCARBAMOL 500 MG PO TABS
500.0000 mg | ORAL_TABLET | Freq: Four times a day (QID) | ORAL | Status: DC | PRN
  Administered 2024-05-05 – 2024-05-06 (×2): 500 mg via ORAL
  Filled 2024-05-05 (×2): qty 1

## 2024-05-05 MED ORDER — ALUM & MAG HYDROXIDE-SIMETH 200-200-20 MG/5ML PO SUSP
30.0000 mL | ORAL | Status: DC | PRN
Start: 2024-05-05 — End: 2024-05-05

## 2024-05-05 MED ORDER — ONDANSETRON HCL 4 MG/2ML IJ SOLN: 4.0000 mg | Freq: Once | INTRAMUSCULAR | Status: DC | PRN

## 2024-05-05 MED ORDER — FENTANYL CITRATE (PF) 250 MCG/5ML IJ SOLN
INTRAMUSCULAR | Status: DC | PRN
  Administered 2024-05-05: 50 ug via INTRAVENOUS

## 2024-05-05 MED ORDER — METOCLOPRAMIDE HCL 5 MG/ML IJ SOLN: 5.0000 mg | Freq: Three times a day (TID) | INTRAMUSCULAR | Status: DC | PRN

## 2024-05-05 MED ORDER — CALCIUM CARBONATE ANTACID 500 MG PO CHEW: 1.0000 | CHEWABLE_TABLET | Freq: Three times a day (TID) | ORAL | Status: DC | PRN

## 2024-05-05 MED ORDER — MUPIROCIN 2 % EX OINT
1.0000 | TOPICAL_OINTMENT | Freq: Two times a day (BID) | CUTANEOUS | Status: DC
  Administered 2024-05-05 – 2024-05-06 (×2): 1 via NASAL
  Filled 2024-05-05: qty 22

## 2024-05-05 MED ORDER — SODIUM CHLORIDE 0.9 % IV SOLN: INTRAVENOUS | Status: DC

## 2024-05-05 MED ORDER — METHOCARBAMOL 1000 MG/10ML IJ SOLN: 500.0000 mg | Freq: Four times a day (QID) | INTRAMUSCULAR | Status: DC | PRN

## 2024-05-05 MED ORDER — ACETAMINOPHEN 325 MG PO TABS: 325.0000 mg | ORAL_TABLET | Freq: Four times a day (QID) | ORAL | Status: DC | PRN

## 2024-05-05 MED ORDER — MENTHOL 3 MG MT LOZG: 1.0000 | LOZENGE | OROMUCOSAL | Status: DC | PRN

## 2024-05-05 MED ORDER — CHLORHEXIDINE GLUCONATE 0.12 % MT SOLN
OROMUCOSAL | Status: AC
  Administered 2024-05-05: 15 mL via OROMUCOSAL
  Filled 2024-05-05: qty 15

## 2024-05-05 MED ORDER — ACETAMINOPHEN 500 MG PO TABS
ORAL_TABLET | ORAL | Status: AC
  Administered 2024-05-05: 1000 mg via ORAL
  Filled 2024-05-05: qty 2

## 2024-05-05 MED ORDER — DIPHENHYDRAMINE HCL 12.5 MG/5ML PO ELIX: 12.5000 mg | ORAL_SOLUTION | ORAL | Status: DC | PRN

## 2024-05-05 MED ORDER — ALBUMIN HUMAN 5 % IV SOLN
12.5000 g | Freq: Once | INTRAVENOUS | Status: AC
  Administered 2024-05-05: 12.5 g via INTRAVENOUS

## 2024-05-05 MED ORDER — TRANEXAMIC ACID-NACL 1000-0.7 MG/100ML-% IV SOLN
1000.0000 mg | INTRAVENOUS | Status: AC
  Administered 2024-05-05: 1000 mg via INTRAVENOUS

## 2024-05-05 MED ORDER — CHLORHEXIDINE GLUCONATE 0.12 % MT SOLN: 15.0000 mL | Freq: Once | OROMUCOSAL | Status: AC

## 2024-05-05 MED ORDER — ICOSAPENT ETHYL 1 G PO CAPS
2.0000 g | ORAL_CAPSULE | Freq: Two times a day (BID) | ORAL | Status: DC
  Administered 2024-05-05 – 2024-05-06 (×3): 2 g via ORAL
  Filled 2024-05-05 (×4): qty 2

## 2024-05-05 MED ORDER — CEFAZOLIN SODIUM-DEXTROSE 2-4 GM/100ML-% IV SOLN
2.0000 g | Freq: Four times a day (QID) | INTRAVENOUS | Status: AC
  Administered 2024-05-05 (×2): 2 g via INTRAVENOUS
  Filled 2024-05-05 (×2): qty 100

## 2024-05-05 MED ORDER — MIDAZOLAM HCL 2 MG/2ML IJ SOLN
INTRAMUSCULAR | Status: AC
  Filled 2024-05-05: qty 2

## 2024-05-05 MED ORDER — AMISULPRIDE (ANTIEMETIC) 5 MG/2ML IV SOLN: 10.0000 mg | Freq: Once | INTRAVENOUS | Status: DC | PRN

## 2024-05-05 MED ORDER — ACETAMINOPHEN 500 MG PO TABS: 1000.0000 mg | ORAL_TABLET | Freq: Once | ORAL | Status: AC

## 2024-05-05 MED ORDER — ONDANSETRON HCL 4 MG/2ML IJ SOLN: 4.0000 mg | Freq: Four times a day (QID) | INTRAMUSCULAR | Status: DC | PRN

## 2024-05-05 MED ORDER — LACTATED RINGERS IV SOLN: INTRAVENOUS | Status: DC

## 2024-05-05 MED ORDER — PROPOFOL 500 MG/50ML IV EMUL
INTRAVENOUS | Status: DC | PRN
  Administered 2024-05-05: 125 ug/kg/min via INTRAVENOUS

## 2024-05-05 MED ORDER — ORAL CARE MOUTH RINSE: 15.0000 mL | Freq: Once | OROMUCOSAL | Status: AC

## 2024-05-05 MED ORDER — CHLORHEXIDINE GLUCONATE CLOTH 2 % EX PADS
6.0000 | MEDICATED_PAD | Freq: Every day | CUTANEOUS | Status: DC
  Administered 2024-05-05 – 2024-05-06 (×2): 6 via TOPICAL

## 2024-05-05 MED ORDER — OXYCODONE HCL 5 MG PO TABS
10.0000 mg | ORAL_TABLET | ORAL | Status: DC | PRN
  Administered 2024-05-05 – 2024-05-06 (×2): 10 mg via ORAL

## 2024-05-05 MED ORDER — CLOPIDOGREL BISULFATE 75 MG PO TABS
75.0000 mg | ORAL_TABLET | Freq: Every day | ORAL | Status: DC
Start: 2024-05-06 — End: 2024-05-06
  Administered 2024-05-06: 75 mg via ORAL
  Filled 2024-05-05: qty 1

## 2024-05-05 MED ORDER — NITROGLYCERIN 0.4 MG SL SUBL: 0.4000 mg | SUBLINGUAL_TABLET | SUBLINGUAL | Status: DC | PRN

## 2024-05-05 MED ORDER — HYDROMORPHONE HCL 1 MG/ML IJ SOLN: 0.5000 mg | INTRAMUSCULAR | Status: DC | PRN

## 2024-05-05 MED ORDER — MIDAZOLAM HCL 2 MG/2ML IJ SOLN
INTRAMUSCULAR | Status: DC | PRN
  Administered 2024-05-05: 2 mg via INTRAVENOUS

## 2024-05-05 MED ORDER — DOCUSATE SODIUM 100 MG PO CAPS
100.0000 mg | ORAL_CAPSULE | Freq: Two times a day (BID) | ORAL | Status: DC
  Administered 2024-05-05 – 2024-05-06 (×3): 100 mg via ORAL
  Filled 2024-05-05 (×3): qty 1

## 2024-05-05 MED ORDER — LISINOPRIL 10 MG PO TABS
5.0000 mg | ORAL_TABLET | Freq: Every day | ORAL | Status: DC
  Administered 2024-05-05 – 2024-05-06 (×2): 5 mg via ORAL
  Filled 2024-05-05 (×2): qty 1

## 2024-05-05 MED ORDER — PHENOL 1.4 % MT LIQD: 1.0000 | OROMUCOSAL | Status: DC | PRN

## 2024-05-05 SURGICAL SUPPLY — 43 items
BAG COUNTER SPONGE SURGICOUNT (BAG) ×1 IMPLANT
BENZOIN TINCTURE PRP APPL 2/3 (GAUZE/BANDAGES/DRESSINGS) ×1 IMPLANT
BLADE CLIPPER SURG (BLADE) IMPLANT
BLADE SAW SGTL 18X1.27X75 (BLADE) ×1 IMPLANT
COVER SURGICAL LIGHT HANDLE (MISCELLANEOUS) ×1 IMPLANT
CUP ACETAB W/GRIPTION 54 (Plate) IMPLANT
DRAPE C-ARM 42X72 X-RAY (DRAPES) ×1 IMPLANT
DRAPE STERI IOBAN 125X83 (DRAPES) ×1 IMPLANT
DRAPE U-SHAPE 47X51 STRL (DRAPES) ×3 IMPLANT
DRSG AQUACEL AG ADV 3.5X10 (GAUZE/BANDAGES/DRESSINGS) ×1 IMPLANT
DURAPREP 26ML APPLICATOR (WOUND CARE) ×1 IMPLANT
ELECT BLADE 6.5 EXT (BLADE) IMPLANT
ELECTRODE BLDE 4.0 EZ CLN MEGD (MISCELLANEOUS) ×1 IMPLANT
ELECTRODE REM PT RTRN 9FT ADLT (ELECTROSURGICAL) ×1 IMPLANT
FACESHIELD WRAPAROUND (MASK) ×2 IMPLANT
FACESHIELD WRAPAROUND OR TEAM (MASK) ×2 IMPLANT
GLOVE BIOGEL PI IND STRL 8 (GLOVE) ×2 IMPLANT
GLOVE ECLIPSE 8.0 STRL XLNG CF (GLOVE) ×1 IMPLANT
GLOVE ORTHO TXT STRL SZ7.5 (GLOVE) ×2 IMPLANT
GOWN STRL REUS W/ TWL LRG LVL3 (GOWN DISPOSABLE) ×2 IMPLANT
GOWN STRL REUS W/ TWL XL LVL3 (GOWN DISPOSABLE) ×2 IMPLANT
HEAD ARTICULEZE (Hips) IMPLANT
KIT BASIN OR (CUSTOM PROCEDURE TRAY) ×1 IMPLANT
KIT TURNOVER KIT B (KITS) ×1 IMPLANT
LINER NEUTRAL 54X36MM PLUS 4 (Hips) IMPLANT
MANIFOLD NEPTUNE II (INSTRUMENTS) ×1 IMPLANT
NS IRRIG 1000ML POUR BTL (IV SOLUTION) ×1 IMPLANT
PACK TOTAL JOINT (CUSTOM PROCEDURE TRAY) ×1 IMPLANT
PAD ARMBOARD POSITIONER FOAM (MISCELLANEOUS) ×1 IMPLANT
SET HNDPC FAN SPRY TIP SCT (DISPOSABLE) ×1 IMPLANT
STAPLER SKIN PROX 35W (STAPLE) IMPLANT
STEM FEM ACTIS STD SZ4 (Stem) IMPLANT
STRIP CLOSURE SKIN 1/2X4 (GAUZE/BANDAGES/DRESSINGS) ×2 IMPLANT
SUT ETHIBOND NAB CT1 #1 30IN (SUTURE) ×1 IMPLANT
SUT MNCRL AB 4-0 PS2 18 (SUTURE) IMPLANT
SUT VIC AB 0 CT1 27XBRD ANBCTR (SUTURE) ×1 IMPLANT
SUT VIC AB 1 CT1 27XBRD ANBCTR (SUTURE) ×1 IMPLANT
SUT VIC AB 2-0 CT1 TAPERPNT 27 (SUTURE) ×1 IMPLANT
TOWEL GREEN STERILE (TOWEL DISPOSABLE) ×1 IMPLANT
TOWEL GREEN STERILE FF (TOWEL DISPOSABLE) ×1 IMPLANT
TRAY CATH INTERMITTENT SS 16FR (CATHETERS) IMPLANT
TRAY FOLEY W/BAG SLVR 16FR ST (SET/KITS/TRAYS/PACK) IMPLANT
WATER STERILE IRR 1000ML POUR (IV SOLUTION) ×2 IMPLANT

## 2024-05-05 NOTE — Transfer of Care (Signed)
 Immediate Anesthesia Transfer of Care Note  Patient: Rodney Strickland  Procedure(s) Performed: ARTHROPLASTY, HIP, TOTAL, ANTERIOR APPROACH,RIGHT (Right: Hip)  Patient Location: PACU  Anesthesia Type:MAC and Spinal  Level of Consciousness: drowsy  Airway & Oxygen Therapy: Patient Spontanous Breathing and Patient connected to face mask oxygen  Post-op Assessment: Report given to RN and Post -op Vital signs reviewed and stable  Post vital signs: Reviewed and stable  Last Vitals:  Vitals Value Taken Time  BP 122/54 05/05/24 11:45  Temp    Pulse 62 05/05/24 11:48  Resp 18 05/05/24 11:48  SpO2 100 % 05/05/24 11:48  Vitals shown include unfiled device data.  Last Pain:  Vitals:   05/05/24 0815  TempSrc: Oral  PainSc: 0-No pain         Complications: No notable events documented.

## 2024-05-05 NOTE — Op Note (Signed)
 Operative Note  Date of operation: 05/05/2024 Preoperative diagnosis: Right hip avascular necrosis Postoperative diagnosis: Same  Procedure: Right direct anterior total hip arthroplasty  Implants: DePuy sector GRIPTION acetabular component size 54, 36+4 polythene liner, Actis femoral component with standard offset, 36+5 metal head ball  Surgeon: Lonni GRADE. Vernetta, MD Assistant: Tory Gaskins, PA-C  Anesthesia: Spinal EBL: 100 cc Antibiotics: IV Ancef Complications: None  Indications: The patient is a 58 year old gentleman who unfortunately developed significant avascular necrosis of his right hip.  His right hip pain has become significant and daily.  It is detrimentally affecting his mobility, his quality of life and his actives daily living and we have recommended a total hip arthroplasty.  He agrees to this as well.  We did discuss the risk of acute blood loss anemia, nerve vessel injury, fracture, infection, DVT, dislocation, implant failure, leg length differences and wound healing issues.  He understands that our goals are hopefully decreased pain, improved mobility and improved quality of life.  Procedure description: After informed consent was obtained and the appropriate right hip was marked, the patient was brought to the operating room and set up in the stretcher where spinal anesthesia was obtained.  He was laid in supine position on stretcher and we assessed his leg lengths.  His right side shorter than the left.  Traction boots were placed on both his feet and a Foley catheter was placed as well.  Next the patient was placed supine on the Hana fracture table with a perineal post and placed in both legs in inline skeletal traction devices but no traction applied.  His right operative hip and pelvis were assessed radiographically.  The right hip was prepped and draped with DuraPrep and sterile drapes.  He was identified as the correct patient and the correct right hip with a  timeout being called.  An incision was then made just inferior and posterior to the ASIS and carried slightly obliquely down the leg.  Dissection was carried down to the tensor fascia lata muscle and the tensor fascia was then divided longitudinally to proceed with a direct anterior approach to the hip.  Circumflex vessels were identified and cauterized.  The hip capsule was identified and opened up and a moderate joint effusion was found.  Cobra tractors were placed around the medial and lateral femoral neck and a femoral neck cut was made with an oscillating saw just proximal to the lesser trochanter.  This cut was completed with an osteotome.  A corkscrew guide was placed in the femoral head and the femoral head was removed in its entirety.  There was a decent divot in the femoral head and definitely evidence of avascular porosis.  A bent Hohmann was then placed over the medial acetabular rim and remnants of the acetabular labrum and other debris were removed.  Reaming was then initiated from a size 43 reamer and stepwise increments going up to a size 53 reamer with all reamers placed under direct visitation in the last 2 reamers also placed under direct fluoroscopy in order to obtain the depth remaining the inclination and the anteversion.  The real DePuy sector GRIPTION acetabular component size 54 was then placed without difficulty followed by a 36+4 polythene liner.  Attention was then turned the femur.  With the right leg externally rotated to 120 degrees, extended and adducted, a Mueller retractors placed medially and a Hohmann tractor behind the greater trochanter.  The lateral joint capsule was released and a box cutting osteotome was used  to enter the femoral canal.  Broaching was then initiated using the Actis broaching system from DePuy from a size 0 going up to a size 4.  With a size 4 place we trialed a standard offset femoral neck and a 36+1.5 trial hip ball.  The right leg was brought over and up  and with traction and internal rotation reduced in the pelvis.  Based on radiographic and clinical assessment we felt like he needed just a little bit more leg length.  We dislocated the hip and with the trial components.  We then placed the real Actis femoral component with standard offset size 4 and with a 36+5 metal head ball.  This was reduced and acetabulum we are pleased with range of motion, stability, offset and leg length measured mechanically and radiographically.  We then irrigated the soft tissue with normal saline solution.  The joint capsule was closed with interrupted #1 Ethibond suture followed by normal Vicryl to close the tensor fascia.  0 Vicryl was used to close the deep tissue and 2-0 Vicryl was used to close subcutaneous tissue.  The skin was closed with staples.  An Aquacel dressing was applied.  The patient was taken off the Hana table and taken the recovery room.  Tory Gaskins, PA-C did assist during the entire case and beginning in his assistance was crucial and medically necessary for soft tissue management and retraction, helping guide implant placement and a layered closure of the wound.

## 2024-05-05 NOTE — Evaluation (Signed)
 Physical Therapy Evaluation Patient Details Name: Rodney Strickland MRN: [id number] DOB: 12-01-1965 Today's Date: 05/05/2024  History of Present Illness  58 y.o. male admitted for elective Rt THA, completed 6/24. PMH CAD, gout, HTN, hypercholesterolemia  Clinical Impression  Patient is s/p above surgery resulting in functional limitations due to the deficits listed below (see PT Problem List). Independent and working PTA. Required min assist for bed mobility, CGA to stand with RW, and supervision for gait up to 80 feet this afternoon Post-Op day #0. Educated on progression of activity, frequent short ambulatory bouts with staff as tolerated, symptom awareness, and safety with mobility using RW for support initially. No signs of buckling with gait. Mild dizziness upon sitting but resolved within 2 minutes and no dizziness reported with standing or gait. Has a flight of steps to navigate to his bedroom which can be reviewed at our next visit. Anticipate great recovery. Patient will benefit from acute skilled PT to increase their independence and safety with mobility to facilitate discharge.         If plan is discharge home, recommend the following: A little help with bathing/dressing/bathroom;Assist for transportation;Help with stairs or ramp for entrance;Assistance with cooking/housework   Can travel by private vehicle        Equipment Recommendations Rolling walker (2 wheels)  Recommendations for Other Services       Functional Status Assessment Patient has had a recent decline in their functional status and demonstrates the ability to make significant improvements in function in a reasonable and predictable amount of time.     Precautions / Restrictions Precautions Precautions: None Recall of Precautions/Restrictions: Intact Restrictions Weight Bearing Restrictions Per Provider Order: Yes RLE Weight Bearing Per Provider Order: Weight bearing as tolerated      Mobility  Bed  Mobility Overal bed mobility: Needs Assistance Bed Mobility: Supine to Sit     Supine to sit: Min assist     General bed mobility comments: Min assist for trunk and RLE (wife attempting to assist mostly.) Cues for technique. Hesitant and guarded.    Transfers Overall transfer level: Needs assistance Equipment used: Rolling walker (2 wheels) Transfers: Sit to/from Stand Sit to Stand: Contact guard assist           General transfer comment: CGA for safety, slow to rise, but stable with RW upon standing. Cues for hand placement/technique. Performed from edge of bed. Cues for hand placement when sitting in recliner.    Ambulation/Gait Ambulation/Gait assistance: Supervision Gait Distance (Feet): 80 Feet Assistive device: Rolling walker (2 wheels) Gait Pattern/deviations: Step-through pattern, Decreased stride length Gait velocity: dec Gait velocity interpretation: <1.31 ft/sec, indicative of household ambulator   General Gait Details: Grossly stable with RW for support. Cues to keep within BOS with turns and when backing up to chair. Good symmetry, minimal antalgic patterning, shorter steps. No episodes of buckling or overt LOB. Denies dizzines. Slow and guarded.  Stairs            Wheelchair Mobility     Tilt Bed    Modified Rankin (Stroke Patients Only)       Balance Overall balance assessment: Needs assistance Sitting-balance support: No upper extremity supported, Feet supported Sitting balance-Leahy Scale: Good     Standing balance support: No upper extremity supported Standing balance-Leahy Scale: Fair Standing balance comment: Feels more confident with UEs supported today  Pertinent Vitals/Pain Pain Assessment Pain Assessment: Faces Faces Pain Scale: Hurts little more Pain Location: Rt hip Pain Descriptors / Indicators: Operative site guarding Pain Intervention(s): Limited activity within patient's tolerance,  Monitored during session, Repositioned    Home Living Family/patient expects to be discharged to:: Private residence Living Arrangements: Spouse/significant other;Children Available Help at Discharge: Family;Available 24 hours/day Type of Home: House Home Access: Stairs to enter Entrance Stairs-Rails: Doctor, general practice of Steps: 3-4 Alternate Level Stairs-Number of Steps: 13 Home Layout: Two level;Bed/bath upstairs Home Equipment: None      Prior Function Prior Level of Function : Independent/Modified Independent;Working/employed;Driving             Mobility Comments: ind ADLs Comments: works Development worker, community. Mostly at a desk.     Extremity/Trunk Assessment   Upper Extremity Assessment Upper Extremity Assessment: Defer to OT evaluation    Lower Extremity Assessment Lower Extremity Assessment: RLE deficits/detail RLE Deficits / Details: Post op guarding/weakness as expected. Tolerates weight bearing.       Communication   Communication Communication: No apparent difficulties    Cognition Arousal: Alert Behavior During Therapy: WFL for tasks assessed/performed   PT - Cognitive impairments: No apparent impairments                         Following commands: Intact       Cueing Cueing Techniques: Verbal cues     General Comments General comments (skin integrity, edema, etc.): Mild dizziness first sitting up. Resolved within 2 min. Denies with standing/walking. Family present, very supportive.    Exercises Total Joint Exercises Ankle Circles/Pumps: AROM, Both, 10 reps, Seated Quad Sets: Strengthening, Both, 10 reps, Supine Short Arc Quad: Strengthening, Right, 10 reps, Supine Heel Slides: AROM, Right, 5 reps, Supine Hip ABduction/ADduction: Strengthening, Right, 10 reps, Supine Long Arc Quad: Strengthening, 10 reps, Right, Seated   Assessment/Plan    PT Assessment Patient needs continued PT services  PT  Problem List Decreased strength;Decreased range of motion;Decreased activity tolerance;Decreased balance;Decreased mobility;Decreased knowledge of use of DME;Pain       PT Treatment Interventions DME instruction;Gait training;Stair training;Functional mobility training;Therapeutic activities;Therapeutic exercise;Balance training;Neuromuscular re-education;Patient/family education;Modalities    PT Goals (Current goals can be found in the Care Plan section)  Acute Rehab PT Goals Patient Stated Goal: Go home PT Goal Formulation: With patient Time For Goal Achievement: 05/19/24 Potential to Achieve Goals: Good    Frequency Min 2X/week     Co-evaluation               AM-PAC PT 6 Clicks Mobility  Outcome Measure Help needed turning from your back to your side while in a flat bed without using bedrails?: A Little Help needed moving from lying on your back to sitting on the side of a flat bed without using bedrails?: A Little Help needed moving to and from a bed to a chair (including a wheelchair)?: A Little Help needed standing up from a chair using your arms (e.g., wheelchair or bedside chair)?: A Little Help needed to walk in hospital room?: A Little Help needed climbing 3-5 steps with a railing? : A Little 6 Click Score: 18    End of Session   Activity Tolerance: Patient tolerated treatment well Patient left: in chair;with call bell/phone within reach;with family/visitor present Nurse Communication: Mobility status PT Visit Diagnosis: Unsteadiness on feet (R26.81);Other abnormalities of gait and mobility (R26.89);Pain Pain - Right/Left: Right Pain - part of body: Hip  Time: 8454-8388 PT Time Calculation (min) (ACUTE ONLY): 26 min   Charges:   PT Evaluation $PT Eval Low Complexity: 1 Low PT Treatments $Therapeutic Activity: 8-22 mins PT General Charges $$ ACUTE PT VISIT: 1 Visit         Leontine Roads, PT, DPT [facility]  Rehabilitation Services Physical  Therapist Office: [phone] Website: [email or web address]   Leontine GORMAN Roads 05/05/2024, 4:27 PM

## 2024-05-05 NOTE — Plan of Care (Signed)

## 2024-05-05 NOTE — Discharge Instructions (Signed)

## 2024-05-05 NOTE — Interval H&P Note (Signed)
 History and Physical Interval Note: The patient understands that he is here today for a right total hip replacement to treat his significant right hip avascular necrosis and pain.  There has been no acute or interval change in his medical status.  The risks and benefits of surgery have been discussed in detail and informed consent has been obtained.  The right operative hip has been marked.  05/05/2024 8:46 AM  Norman LITTIE Brooke  has presented today for surgery, with the diagnosis of avascular necrosis right hip.  The various methods of treatment have been discussed with the patient and family. After consideration of risks, benefits and other options for treatment, the patient has consented to  Procedure(s): ARTHROPLASTY, HIP, TOTAL, ANTERIOR APPROACH (Right) as a surgical intervention.  The patient's history has been reviewed, patient examined, no change in status, stable for surgery.  I have reviewed the patient's chart and labs.  Questions were answered to the patient's satisfaction.     Lonni CINDERELLA Poli

## 2024-05-05 NOTE — Anesthesia Postprocedure Evaluation (Signed)
 Anesthesia Post Note  Patient: Rodney Strickland  Procedure(s) Performed: ARTHROPLASTY, HIP, TOTAL, ANTERIOR APPROACH,RIGHT (Right: Hip)     Patient location during evaluation: PACU Anesthesia Type: Spinal Level of consciousness: awake, awake and alert and oriented Pain management: pain level controlled Vital Signs Assessment: post-procedure vital signs reviewed and stable Respiratory status: spontaneous breathing, nonlabored ventilation and respiratory function stable Cardiovascular status: blood pressure returned to baseline and stable Postop Assessment: no headache, no backache, spinal receding and no apparent nausea or vomiting Anesthetic complications: no   No notable events documented.  Last Vitals:  Vitals:   05/05/24 1245 05/05/24 1300  BP: (!) 88/52 94/64  Pulse: (!) 56 (!) 57  Resp: 17 15  Temp:    SpO2: 100% 98%    Last Pain:  Vitals:   05/05/24 1245  TempSrc:   PainSc: 0-No pain                 Garnette FORBES Skillern

## 2024-05-06 ENCOUNTER — Other Ambulatory Visit (HOSPITAL_BASED_OUTPATIENT_CLINIC_OR_DEPARTMENT_OTHER): Payer: Self-pay

## 2024-05-06 ENCOUNTER — Telehealth: Payer: Self-pay | Admitting: Orthopaedic Surgery

## 2024-05-06 ENCOUNTER — Encounter (HOSPITAL_COMMUNITY): Payer: Self-pay | Admitting: Orthopaedic Surgery

## 2024-05-06 DIAGNOSIS — I10 Essential (primary) hypertension: Secondary | ICD-10-CM | POA: Diagnosis not present

## 2024-05-06 DIAGNOSIS — M87851 Other osteonecrosis, right femur: Secondary | ICD-10-CM | POA: Diagnosis not present

## 2024-05-06 DIAGNOSIS — Z7901 Long term (current) use of anticoagulants: Secondary | ICD-10-CM | POA: Diagnosis not present

## 2024-05-06 DIAGNOSIS — I2510 Atherosclerotic heart disease of native coronary artery without angina pectoris: Secondary | ICD-10-CM | POA: Diagnosis not present

## 2024-05-06 DIAGNOSIS — R531 Weakness: Secondary | ICD-10-CM | POA: Diagnosis not present

## 2024-05-06 DIAGNOSIS — Z96641 Presence of right artificial hip joint: Secondary | ICD-10-CM | POA: Diagnosis not present

## 2024-05-06 DIAGNOSIS — M25551 Pain in right hip: Secondary | ICD-10-CM | POA: Diagnosis not present

## 2024-05-06 LAB — CBC
HCT: 37.2 % — ABNORMAL LOW (ref 39.0–52.0)
Hemoglobin: 12.9 g/dL — ABNORMAL LOW (ref 13.0–17.0)
MCH: 31.5 pg (ref 26.0–34.0)
MCHC: 34.7 g/dL (ref 30.0–36.0)
MCV: 90.7 fL (ref 80.0–100.0)
Platelets: 221 10*3/uL (ref 150–400)
RBC: 4.1 MIL/uL — ABNORMAL LOW (ref 4.22–5.81)
RDW: 12.3 % (ref 11.5–15.5)
WBC: 13.5 10*3/uL — ABNORMAL HIGH (ref 4.0–10.5)
nRBC: 0 % (ref 0.0–0.2)

## 2024-05-06 LAB — BASIC METABOLIC PANEL WITH GFR
Anion gap: 5 (ref 5–15)
BUN: 19 mg/dL (ref 6–20)
CO2: 22 mmol/L (ref 22–32)
Calcium: 8.7 mg/dL — ABNORMAL LOW (ref 8.9–10.3)
Chloride: 107 mmol/L (ref 98–111)
Creatinine, Ser: 1.15 mg/dL (ref 0.61–1.24)
GFR, Estimated: >60 mL/min (ref >60)
Glucose, Bld: 151 mg/dL — ABNORMAL HIGH (ref 70–99)
Potassium: 4.3 mmol/L (ref 3.5–5.1)
Sodium: 134 mmol/L — ABNORMAL LOW (ref 135–145)

## 2024-05-06 MED ORDER — OXYCODONE HCL 5 MG PO TABS
5.0000 mg | ORAL_TABLET | Freq: Four times a day (QID) | ORAL | 0 refills | Status: DC | PRN
  Filled 2024-05-06: qty 30, 4d supply, fill #0

## 2024-05-06 MED ORDER — METHOCARBAMOL 500 MG PO TABS
500.0000 mg | ORAL_TABLET | Freq: Four times a day (QID) | ORAL | 1 refills | Status: DC | PRN
  Filled 2024-05-06: qty 30, 8d supply, fill #0

## 2024-05-06 NOTE — Progress Notes (Signed)

## 2024-05-06 NOTE — Progress Notes (Signed)
 Physical Therapy Treatment  Patient Details Name: Rodney Strickland MRN: [id number] DOB: 08/06/1966 Today's Date: 05/06/2024   History of Present Illness 58 y.o. male admitted for elective Rt THA, completed 6/24. PMH CAD, gout, HTN, hypercholesterolemia    PT Comments  Pt progressing towards physical therapy goals. Was able to perform transfers and ambuation with up to min assist and RW for support. Pt reliant on external assist for some therapeutic exercise and transfers. Encouraged pt to be as independent as possible. Will see for a second session to complete stair training and practice bed mobility from an elevated surface to simulate home environment to prep for return home today.     If plan is discharge home, recommend the following: A little help with bathing/dressing/bathroom;Assist for transportation;Help with stairs or ramp for entrance;Assistance with cooking/housework   Can travel by private vehicle        Equipment Recommendations  Rolling walker (2 wheels);BSC/3in1    Recommendations for Other Services       Precautions / Restrictions Precautions Precautions: None Recall of Precautions/Restrictions: Intact Restrictions Weight Bearing Restrictions Per Provider Order: Yes RLE Weight Bearing Per Provider Order: Weight bearing as tolerated     Mobility  Bed Mobility Overal bed mobility: Needs Assistance Bed Mobility: Supine to Sit     Supine to sit: Min assist     General bed mobility comments: Pt reaching out for wife's assist. Encouraged pt to complete on his own. Was able to transition to EOB with min assist for trunk elevation. Pt advancing RLE with LLE.    Transfers Overall transfer level: Needs assistance Equipment used: Rolling walker (2 wheels) Transfers: Sit to/from Stand Sit to Stand: Contact guard assist           General transfer comment: VC's for hand placement on seated surface for safety.    Ambulation/Gait Ambulation/Gait assistance:  Supervision, Contact guard assist Gait Distance (Feet): 100 Feet Assistive device: Rolling walker (2 wheels) Gait Pattern/deviations: Step-through pattern, Decreased stride length Gait velocity: Decreased Gait velocity interpretation: <1.31 ft/sec, indicative of household ambulator   General Gait Details: VC's for sequencing with the RW, including increased heel strike and step-through gait pattern. Very slow initially but able to slightly improve gait speed with distance.   Stairs             Wheelchair Mobility     Tilt Bed    Modified Rankin (Stroke Patients Only)       Balance Overall balance assessment: Needs assistance Sitting-balance support: No upper extremity supported, Feet supported Sitting balance-Leahy Scale: Good     Standing balance support: No upper extremity supported Standing balance-Leahy Scale: Fair Standing balance comment: Feels more confident with UEs supported today                            Communication Communication Communication: No apparent difficulties  Cognition Arousal: Alert Behavior During Therapy: WFL for tasks assessed/performed   PT - Cognitive impairments: No apparent impairments                         Following commands: Intact      Cueing Cueing Techniques: Verbal cues  Exercises Total Joint Exercises Ankle Circles/Pumps: 10 reps Quad Sets: 10 reps Short Arc Quad: 10 reps Heel Slides: 10 reps Hip ABduction/ADduction: 10 reps Straight Leg Raises: 10 reps Long Arc Quad: 10 reps    General Comments  Pertinent Vitals/Pain Pain Assessment Pain Assessment: Faces Faces Pain Scale: Hurts little more Pain Location: Rt hip Pain Descriptors / Indicators: Operative site guarding Pain Intervention(s): Limited activity within patient's tolerance, Monitored during session, Repositioned    Home Living Family/patient expects to be discharged to:: Private residence Living Arrangements:  Spouse/significant other;Children Available Help at Discharge: Family;Available 24 hours/day Type of Home: House Home Access: Stairs to enter Entrance Stairs-Rails: Doctor, general practice of Steps: 3-4 Alternate Level Stairs-Number of Steps: 13 Home Layout: Two level;Bed/bath upstairs Home Equipment: None      Prior Function            PT Goals (current goals can now be found in the care plan section) Acute Rehab PT Goals Patient Stated Goal: Go home PT Goal Formulation: With patient Time For Goal Achievement: 05/19/24 Potential to Achieve Goals: Good Progress towards PT goals: Progressing toward goals    Frequency    Min 2X/week      PT Plan      Co-evaluation              AM-PAC PT 6 Clicks Mobility   Outcome Measure  Help needed turning from your back to your side while in a flat bed without using bedrails?: A Little Help needed moving from lying on your back to sitting on the side of a flat bed without using bedrails?: A Little Help needed moving to and from a bed to a chair (including a wheelchair)?: A Little Help needed standing up from a chair using your arms (e.g., wheelchair or bedside chair)?: A Little Help needed to walk in hospital room?: A Little Help needed climbing 3-5 steps with a railing? : A Little 6 Click Score: 18    End of Session Equipment Utilized During Treatment: Gait belt Activity Tolerance: Patient tolerated treatment well Patient left: in chair;with call bell/phone within reach;with family/visitor present Nurse Communication: Mobility status PT Visit Diagnosis: Unsteadiness on feet (R26.81);Other abnormalities of gait and mobility (R26.89);Pain Pain - Right/Left: Right Pain - part of body: Hip     Time: 0824-0910 PT Time Calculation (min) (ACUTE ONLY): 46 min  Charges:    $Gait Training: 8-22 mins $Therapeutic Exercise: 23-37 mins PT General Charges $$ ACUTE PT VISIT: 1 Visit                     Rodney Strickland, PT, DPT Acute Rehabilitation Services Secure Chat Preferred Office: [phone]    Rodney Strickland 05/06/2024, 10:10 AM

## 2024-05-06 NOTE — Progress Notes (Signed)
 Subjective: 1 Day Post-Op Procedure(s) (LRB): ARTHROPLASTY, HIP, TOTAL, ANTERIOR APPROACH,RIGHT (Right) Patient reports pain as moderate.    Objective: Vital signs in last 24 hours: Temp:  [96.6 F (35.9 C)-98.1 F (36.7 C)] 97.9 F (36.6 C) (06/25 0639) Pulse Rate:  [50-83] 74 (06/25 0639) Resp:  [11-20] 20 (06/25 0639) BP: (76-136)/(49-88) 106/69 (06/25 0639) SpO2:  [97 %-100 %] 98 % (06/25 0639) Weight:  [72.1 kg] 72.1 kg (06/24 0815)  Intake/Output from previous day: 06/24 0701 - 06/25 0700 In: 1290 [P.O.:240; I.V.:850; IV Piggyback:200] Out: 575 [Urine:475; Blood:100] Intake/Output this shift: No intake/output data recorded.  Recent Labs    05/06/24 0550  HGB 12.9*   Recent Labs    05/06/24 0550  WBC 13.5*  RBC 4.10*  HCT 37.2*  PLT 221   Recent Labs    05/06/24 0550  NA 134*  K 4.3  CL 107  CO2 22  BUN 19  CREATININE 1.15  GLUCOSE 151*  CALCIUM  8.7*   No results for input(s): LABPT, INR in the last 72 hours.  Sensation intact distally Intact pulses distally Dorsiflexion/Plantar flexion intact Incision: scant drainage   Assessment/Plan: 1 Day Post-Op Procedure(s) (LRB): ARTHROPLASTY, HIP, TOTAL, ANTERIOR APPROACH,RIGHT (Right) Up with therapy Discharge home with home health      Lonni CINDERELLA Poli 05/06/2024, 7:44 AM

## 2024-05-06 NOTE — Progress Notes (Signed)
 Physical Therapy Treatment  Patient Details Name: Rodney Strickland MRN: [id number] DOB: Oct 27, 1966 Today's Date: 05/06/2024   History of Present Illness 58 y.o. male admitted for elective Rt THA, completed 6/24. PMH CAD, gout, HTN, hypercholesterolemia    PT Comments  Pt progressing towards physical therapy goals. Was able to perform transfers and ambulation with gross CGA and RW for support. Completed stair training, car transfer, bed mobility to high bed height, and reviewed activity recommendations. Will continue to follow and progress as able per POC. Pt anticipates d/c this afternoon.     If plan is discharge home, recommend the following: A little help with bathing/dressing/bathroom;Assist for transportation;Help with stairs or ramp for entrance;Assistance with cooking/housework   Can travel by private vehicle        Equipment Recommendations  Rolling walker (2 wheels);BSC/3in1    Recommendations for Other Services       Precautions / Restrictions Precautions Precautions: None Recall of Precautions/Restrictions: Intact Restrictions Weight Bearing Restrictions Per Provider Order: Yes RLE Weight Bearing Per Provider Order: Weight bearing as tolerated     Mobility  Bed Mobility Overal bed mobility: Needs Assistance Bed Mobility: Supine to Sit     Supine to sit: Min assist     General bed mobility comments: Pt advancing RLE with LLE. Increased time due to pain. Practiced to/from EOB with increased bed height to simulate home environment.    Transfers Overall transfer level: Needs assistance Equipment used: Rolling walker (2 wheels) Transfers: Sit to/from Stand Sit to Stand: Contact guard assist           General transfer comment: VC's for hand placement on seated surface for safety.    Ambulation/Gait Ambulation/Gait assistance: Contact guard assist Gait Distance (Feet): 75 Feet Assistive device: Rolling walker (2 wheels) Gait Pattern/deviations:  Step-through pattern, Decreased stride length Gait velocity: Decreased Gait velocity interpretation: <1.31 ft/sec, indicative of household ambulator   General Gait Details: VC's for sequencing with the RW, including increased heel strike and step-through gait pattern. Very slow initially but able to slightly improve gait speed with distance.   Stairs Stairs: Yes Stairs assistance: Contact guard assist Stair Management: One rail Right, Step to pattern, Sideways, Forwards Number of Stairs: 10 General stair comments: VC's for sequencing and general safety. No assist but pt required hands on guarding for safety. Initially forwards but transitioned to sideways negotiation for comfort.   Wheelchair Mobility     Tilt Bed    Modified Rankin (Stroke Patients Only)       Balance Overall balance assessment: Needs assistance Sitting-balance support: No upper extremity supported, Feet supported Sitting balance-Leahy Scale: Good     Standing balance support: No upper extremity supported Standing balance-Leahy Scale: Fair Standing balance comment: Feels more confident with UEs supported today                            Communication Communication Communication: No apparent difficulties  Cognition Arousal: Alert Behavior During Therapy: WFL for tasks assessed/performed   PT - Cognitive impairments: No apparent impairments                         Following commands: Intact      Cueing Cueing Techniques: Verbal cues  Exercises Total Joint Exercises Ankle Circles/Pumps: 10 reps Quad Sets: 10 reps Short Arc Quad: 10 reps Heel Slides: 10 reps Hip ABduction/ADduction: 10 reps Straight Leg Raises: 10 reps Long Arc Quad:  10 reps    General Comments General comments (skin integrity, edema, etc.): Pt reports dizziness at end of session however reports it's ok, it's not bad I can handle it.      Pertinent Vitals/Pain Pain Assessment Pain Assessment:  Faces Faces Pain Scale: Hurts little more Pain Location: Rt hip Pain Descriptors / Indicators: Operative site guarding Pain Intervention(s): Limited activity within patient's tolerance, Monitored during session, Repositioned    Home Living                          Prior Function            PT Goals (current goals can now be found in the care plan section) Acute Rehab PT Goals Patient Stated Goal: Go home PT Goal Formulation: With patient Time For Goal Achievement: 05/19/24 Potential to Achieve Goals: Good Progress towards PT goals: Progressing toward goals    Frequency    Min 2X/week      PT Plan      Co-evaluation              AM-PAC PT 6 Clicks Mobility   Outcome Measure  Help needed turning from your back to your side while in a flat bed without using bedrails?: A Little Help needed moving from lying on your back to sitting on the side of a flat bed without using bedrails?: A Little Help needed moving to and from a bed to a chair (including a wheelchair)?: A Little Help needed standing up from a chair using your arms (e.g., wheelchair or bedside chair)?: A Little Help needed to walk in hospital room?: A Little Help needed climbing 3-5 steps with a railing? : A Little 6 Click Score: 18    End of Session Equipment Utilized During Treatment: Gait belt Activity Tolerance: Patient tolerated treatment well Patient left: in chair;with call bell/phone within reach;with family/visitor present Nurse Communication: Mobility status PT Visit Diagnosis: Unsteadiness on feet (R26.81);Other abnormalities of gait and mobility (R26.89);Pain Pain - Right/Left: Right Pain - part of body: Hip     Time: 8661-8584 PT Time Calculation (min) (ACUTE ONLY): 37 min  Charges:    $Gait Training: 23-37 mins PT General Charges $$ ACUTE PT VISIT: 1 Visit                     Leita Sable, PT, DPT Acute Rehabilitation Services Secure Chat Preferred Office:  [phone]    Leita JONETTA Sable 05/06/2024, 2:27 PM

## 2024-05-06 NOTE — Plan of Care (Signed)
   Problem: Education: Goal: Knowledge of General Education information will improve Description: Including pain rating scale, medication(s)/side effects and non-pharmacologic comfort measures Outcome: Completed/Met   Problem: Health Behavior/Discharge Planning: Goal: Ability to manage health-related needs will improve Outcome: Completed/Met   Problem: Clinical Measurements: Goal: Ability to maintain clinical measurements within normal limits will improve Outcome: Completed/Met Goal: Will remain free from infection Outcome: Completed/Met Goal: Diagnostic test results will improve Outcome: Completed/Met Goal: Respiratory complications will improve Outcome: Completed/Met Goal: Cardiovascular complication will be avoided Outcome: Completed/Met   Problem: Activity: Goal: Risk for activity intolerance will decrease Outcome: Completed/Met   Problem: Nutrition: Goal: Adequate nutrition will be maintained Outcome: Completed/Met   Problem: Coping: Goal: Level of anxiety will decrease Outcome: Completed/Met   Problem: Elimination: Goal: Will not experience complications related to bowel motility Outcome: Completed/Met Goal: Will not experience complications related to urinary retention Outcome: Completed/Met   Problem: Pain Managment: Goal: General experience of comfort will improve and/or be controlled Outcome: Completed/Met   Problem: Safety: Goal: Ability to remain free from injury will improve Outcome: Completed/Met   Problem: Skin Integrity: Goal: Risk for impaired skin integrity will decrease Outcome: Completed/Met   Problem: Education: Goal: Knowledge of the prescribed therapeutic regimen will improve Outcome: Completed/Met Goal: Understanding of discharge needs will improve Outcome: Completed/Met Goal: Individualized Educational Video(s) Outcome: Completed/Met   Problem: Activity: Goal: Ability to avoid complications of mobility impairment will  improve Outcome: Completed/Met Goal: Ability to tolerate increased activity will improve Outcome: Completed/Met   Problem: Clinical Measurements: Goal: Postoperative complications will be avoided or minimized Outcome: Completed/Met   Problem: Pain Management: Goal: Pain level will decrease with appropriate interventions Outcome: Completed/Met   Problem: Skin Integrity: Goal: Will show signs of wound healing Outcome: Completed/Met

## 2024-05-06 NOTE — Telephone Encounter (Signed)
 Pt submitted FMLA forms his wife to be his care giver for his surgery  and [employer] fr short term for himself, medial release form and $40.00 payment 05/06/24

## 2024-05-06 NOTE — Discharge Summary (Signed)
 Patient ID: Rodney Strickland MRN: [id number] DOB/AGE: 08-10-1966 58 y.o.  Admit date: 05/05/2024 Discharge date: 05/06/2024  Admission Diagnoses:  Principal Problem:   Avascular necrosis of bone of hip, right (HCC) Active Problems:   Status post total replacement of right hip   Discharge Diagnoses:  Same  Past Medical History:  Diagnosis Date   Coronary artery disease    Gout    Hypercholesteremia    Hypertension    Injury of both lungs 1986   water  in lungs   Minimal change disease     Surgeries: Procedure(s): ARTHROPLASTY, HIP, TOTAL, ANTERIOR APPROACH,RIGHT on 05/05/2024   Consultants:   Discharged Condition: Improved  Hospital Course: Rodney Strickland is an 58 y.o. male who was admitted 05/05/2024 for operative treatment ofAvascular necrosis of bone of hip, right (HCC). Patient has severe unremitting pain that affects sleep, daily activities, and work/hobbies. After pre-op clearance the patient was taken to the operating room on 05/05/2024 and underwent  Procedure(s): ARTHROPLASTY, HIP, TOTAL, ANTERIOR APPROACH,RIGHT.    Patient was given perioperative antibiotics:  Anti-infectives (From admission, onward)    Start     Dose/Rate Route Frequency Ordered Stop   05/05/24 1515  ceFAZolin (ANCEF) IVPB 2g/100 mL premix        2 g 200 mL/hr over 30 Minutes Intravenous Every 6 hours 05/05/24 1416 05/06/24 1105   05/05/24 0815  ceFAZolin (ANCEF) IVPB 2g/100 mL premix        2 g 200 mL/hr over 30 Minutes Intravenous On call to O.R. 05/05/24 0805 05/05/24 1051   05/05/24 0807  ceFAZolin (ANCEF) 2-4 GM/100ML-% IVPB       Note to Pharmacy: Ezequiel Henri: cabinet override      05/05/24 0807 05/05/24 1037        Patient was given sequential compression devices, early ambulation, and chemoprophylaxis to prevent DVT.  Patient benefited maximally from hospital stay and there were no complications.    Recent vital signs: Patient Vitals for the past 24 hrs:  BP Temp Temp  src Pulse Resp SpO2  05/06/24 0804 104/67 97.7 F (36.5 C) Oral 70 17 100 %  05/06/24 0639 106/69 97.9 F (36.6 C) Oral 74 20 98 %  05/06/24 0047 109/71 97.9 F (36.6 C) Oral 64 16 98 %  05/05/24 2120 131/78 98.1 F (36.7 C) Oral 83 18 100 %     Recent laboratory studies:  Recent Labs    05/06/24 0550  WBC 13.5*  HGB 12.9*  HCT 37.2*  PLT 221  NA 134*  K 4.3  CL 107  CO2 22  BUN 19  CREATININE 1.15  GLUCOSE 151*  CALCIUM  8.7*     Discharge Medications:   Allergies as of 05/06/2024       Reactions   Dapsone    Other reaction(s): nausea and vomiting   Statins    Per patient's wife, he cannot have statin drugs        Medication List     TAKE these medications    acetaminophen  325 MG tablet Commonly known as: TYLENOL  Take 650 mg by mouth every 6 (six) hours as needed for headache.   allopurinol  300 MG tablet Commonly known as: ZYLOPRIM  Take 0.5 tablets (150 mg total) by mouth daily.   ascorbic acid  500 MG tablet Commonly known as: VITAMIN C  Take 500 mg by mouth daily.   aspirin  EC 81 MG tablet Take 81 mg by mouth daily. Swallow whole.   Betasept  Surgical Scrub 4 % external  liquid Generic drug: chlorhexidine  Apply 15 mLs (1 Application total) topically as directed for 30 doses. Use as directed daily for 5 days every other week for 6 weeks.   calcium  carbonate 500 MG chewable tablet Commonly known as: TUMS - dosed in mg elemental calcium  Chew 2 tablets by mouth as needed for indigestion or heartburn.   CLARITIN  PO Take 1 tablet by mouth as needed.   clopidogrel  75 MG tablet Commonly known as: PLAVIX  Take 1 tablet (75 mg total) by mouth daily.   colchicine  0.6 MG tablet Take 1 tablet (0.6 mg total) by mouth daily. What changed:  when to take this reasons to take this   gabapentin  100 MG capsule Commonly known as: NEURONTIN  Take 2 capsules (200 mg total) by mouth at bedtime. What changed:  how much to take when to take this reasons to  take this   icosapent  Ethyl 1 g capsule Commonly known as: VASCEPA  Take 2 g by mouth 2 (two) times daily.   lisinopril  10 MG tablet Commonly known as: ZESTRIL  Take 0.5 tablets by mouth daily.   methocarbamol  500 MG tablet Commonly known as: ROBAXIN  Take 1 tablet (500 mg total) by mouth every 6 (six) hours as needed for muscle spasms.   metoprolol  tartrate 25 MG tablet Commonly known as: LOPRESSOR  Take 0.5 tablets (12.5 mg total) by mouth 2 (two) times daily.   mupirocin  ointment 2 % Commonly known as: BACTROBAN  Place 1 Application into the nose 2 (two) times daily. Use as directed 2 times daily for 5 days every other week for 6 weeks.   nitroGLYCERIN  0.4 MG SL tablet Commonly known as: NITROSTAT  Place 1 tablet (0.4 mg total) under the tongue every 5 (five) minutes x 3 doses as needed for chest pain.   oxyCODONE  5 MG immediate release tablet Commonly known as: Oxy IR/ROXICODONE  Take 1-2 tablets (5-10 mg total) by mouth every 6 (six) hours as needed for moderate pain (pain score 4-6) (pain score 4-6).   pantoprazole  40 MG tablet Commonly known as: Protonix  Take 1 tablet (40 mg total) by mouth daily as needed.   Repatha  SureClick 140 MG/ML Soaj Generic drug: Evolocumab  Inject 140 mg into the skin every 14 (fourteen) days.               Durable Medical Equipment  (From admission, onward)           Start     Ordered   05/05/24 1417  DME 3 n 1  Once        05/05/24 1416   05/05/24 1417  DME Walker rolling  Once       Question Answer Comment  Walker: With 5 Inch Wheels   Patient needs a walker to treat with the following condition Status post total replacement of right hip      05/05/24 1416            Diagnostic Studies: DG Pelvis Portable Result Date: 05/05/2024 CLINICAL DATA:  Status post right hip replacement. EXAM: PORTABLE PELVIS 1-2 VIEWS COMPARISON:  None Available. FINDINGS: Right hip arthroplasty in expected alignment. No periprosthetic lucency  or fracture. Recent postsurgical change includes air and edema in the soft tissues. Overlying skin staples in place. IMPRESSION: Right hip arthroplasty without immediate postoperative complication. Electronically Signed   By: Andrea Gasman M.D.   On: 05/05/2024 12:55   DG HIP UNILAT WITH PELVIS 1V RIGHT Result Date: 05/05/2024 CLINICAL DATA:  Elective surgery. EXAM: DG HIP (WITH OR WITHOUT PELVIS) 1V  RIGHT COMPARISON:  None Available. FINDINGS: Three fluoroscopic spot views of the pelvis and right hip obtained in the operating room. Images during hip arthroplasty. Fluoroscopy time 19.4 seconds. Dose 1.9879 mGy. IMPRESSION: Intraoperative fluoroscopy during right hip arthroplasty. Electronically Signed   By: Andrea Gasman M.D.   On: 05/05/2024 12:46   DG C-Arm 1-60 Min-No Report Result Date: 05/05/2024 Fluoroscopy was utilized by the requesting physician.  No radiographic interpretation.    Disposition: Discharge disposition: 01-Home or Self Care          Follow-up Information     [facility] Follow up.   Why: [facility] will contact you for the first home visit. Contact information: [phone]        Vernetta Lonni GRADE, MD Follow up in 2 week(s).   Specialty: Orthopedic Surgery Contact information: [address] [place] [address] [phone]                  Signed: Lonni GRADE Vernetta 05/06/2024, 6:31 PM

## 2024-05-06 NOTE — Telephone Encounter (Signed)
 Pt requesting handicap placard. Please call pt's wife Nerissa when ready for pick up at [phone].

## 2024-05-07 ENCOUNTER — Telehealth: Payer: Self-pay | Admitting: Orthopaedic Surgery

## 2024-05-07 DIAGNOSIS — Z7982 Long term (current) use of aspirin: Secondary | ICD-10-CM | POA: Diagnosis not present

## 2024-05-07 DIAGNOSIS — Z7902 Long term (current) use of antithrombotics/antiplatelets: Secondary | ICD-10-CM | POA: Diagnosis not present

## 2024-05-07 DIAGNOSIS — M109 Gout, unspecified: Secondary | ICD-10-CM | POA: Diagnosis not present

## 2024-05-07 DIAGNOSIS — I252 Old myocardial infarction: Secondary | ICD-10-CM | POA: Diagnosis not present

## 2024-05-07 DIAGNOSIS — Z471 Aftercare following joint replacement surgery: Secondary | ICD-10-CM | POA: Diagnosis not present

## 2024-05-07 DIAGNOSIS — I10 Essential (primary) hypertension: Secondary | ICD-10-CM | POA: Diagnosis not present

## 2024-05-07 DIAGNOSIS — I2510 Atherosclerotic heart disease of native coronary artery without angina pectoris: Secondary | ICD-10-CM | POA: Diagnosis not present

## 2024-05-07 DIAGNOSIS — Z96641 Presence of right artificial hip joint: Secondary | ICD-10-CM | POA: Diagnosis not present

## 2024-05-07 NOTE — Telephone Encounter (Signed)
 IC.lmvm advising forms ready to pick up at front desk

## 2024-05-07 NOTE — Telephone Encounter (Signed)
 Patient aware handicap paper at front desk

## 2024-05-07 NOTE — Telephone Encounter (Signed)
 Received

## 2024-05-13 DIAGNOSIS — Z7982 Long term (current) use of aspirin: Secondary | ICD-10-CM | POA: Diagnosis not present

## 2024-05-13 DIAGNOSIS — I10 Essential (primary) hypertension: Secondary | ICD-10-CM | POA: Diagnosis not present

## 2024-05-13 DIAGNOSIS — M109 Gout, unspecified: Secondary | ICD-10-CM | POA: Diagnosis not present

## 2024-05-13 DIAGNOSIS — Z7902 Long term (current) use of antithrombotics/antiplatelets: Secondary | ICD-10-CM | POA: Diagnosis not present

## 2024-05-13 DIAGNOSIS — Z471 Aftercare following joint replacement surgery: Secondary | ICD-10-CM | POA: Diagnosis not present

## 2024-05-13 DIAGNOSIS — Z96641 Presence of right artificial hip joint: Secondary | ICD-10-CM | POA: Diagnosis not present

## 2024-05-13 DIAGNOSIS — I252 Old myocardial infarction: Secondary | ICD-10-CM | POA: Diagnosis not present

## 2024-05-13 DIAGNOSIS — I2510 Atherosclerotic heart disease of native coronary artery without angina pectoris: Secondary | ICD-10-CM | POA: Diagnosis not present

## 2024-05-18 ENCOUNTER — Ambulatory Visit (INDEPENDENT_AMBULATORY_CARE_PROVIDER_SITE_OTHER): Admitting: Orthopaedic Surgery

## 2024-05-18 ENCOUNTER — Encounter: Payer: Self-pay | Admitting: Orthopaedic Surgery

## 2024-05-18 ENCOUNTER — Other Ambulatory Visit (HOSPITAL_BASED_OUTPATIENT_CLINIC_OR_DEPARTMENT_OTHER): Payer: Self-pay

## 2024-05-18 DIAGNOSIS — N179 Acute kidney failure, unspecified: Secondary | ICD-10-CM | POA: Diagnosis not present

## 2024-05-18 DIAGNOSIS — Z96641 Presence of right artificial hip joint: Secondary | ICD-10-CM

## 2024-05-18 MED ORDER — METHOCARBAMOL 500 MG PO TABS
500.0000 mg | ORAL_TABLET | Freq: Four times a day (QID) | ORAL | 1 refills | Status: AC | PRN
  Filled 2024-05-18: qty 30, 8d supply, fill #0

## 2024-05-18 MED ORDER — OXYCODONE HCL 5 MG PO TABS
5.0000 mg | ORAL_TABLET | Freq: Four times a day (QID) | ORAL | 0 refills | Status: AC | PRN
  Filled 2024-05-18: qty 30, 4d supply, fill #0

## 2024-05-18 NOTE — Progress Notes (Signed)
 The patient is here today for his first postoperative visit status post a right total hip replacement to treat osteoarthritis with osteonecrosis of the right hip.  He is ambulating with a walker.  He is only 58 years old.  He is reporting groin pain as well as knee pain on the right side.  I told him this is to be expected after surgery such as this.  He does need a refill of oxycodone  and Robaxin .  His right hip incision looks good.  Staples were removed and Steri-Strips applied.  There is no significant seroma.  His right knee only has some minimal swelling.  I described in detail what the surgery involves and how all the pain that he is experiencing is appropriate.  I do feel it is reasonable for him to continue oxycodone  as needed as well as Robaxin .  He can stop his baby aspirin  but will continue Plavix  which he has been on for a long period of time.  We will see him back in a month to see how he is doing overall.  Since he is having enough discomfort we will obtain a standing AP pelvis at that visit.

## 2024-05-19 DIAGNOSIS — I2510 Atherosclerotic heart disease of native coronary artery without angina pectoris: Secondary | ICD-10-CM | POA: Diagnosis not present

## 2024-05-19 DIAGNOSIS — I252 Old myocardial infarction: Secondary | ICD-10-CM | POA: Diagnosis not present

## 2024-05-19 DIAGNOSIS — Z96641 Presence of right artificial hip joint: Secondary | ICD-10-CM | POA: Diagnosis not present

## 2024-05-19 DIAGNOSIS — Z7982 Long term (current) use of aspirin: Secondary | ICD-10-CM | POA: Diagnosis not present

## 2024-05-19 DIAGNOSIS — Z471 Aftercare following joint replacement surgery: Secondary | ICD-10-CM | POA: Diagnosis not present

## 2024-05-19 DIAGNOSIS — I10 Essential (primary) hypertension: Secondary | ICD-10-CM | POA: Diagnosis not present

## 2024-05-19 DIAGNOSIS — Z7902 Long term (current) use of antithrombotics/antiplatelets: Secondary | ICD-10-CM | POA: Diagnosis not present

## 2024-05-19 DIAGNOSIS — M109 Gout, unspecified: Secondary | ICD-10-CM | POA: Diagnosis not present

## 2024-05-20 ENCOUNTER — Other Ambulatory Visit (HOSPITAL_BASED_OUTPATIENT_CLINIC_OR_DEPARTMENT_OTHER): Payer: Self-pay

## 2024-05-20 ENCOUNTER — Telehealth: Payer: Self-pay | Admitting: Physician Assistant

## 2024-05-20 DIAGNOSIS — N050 Unspecified nephritic syndrome with minor glomerular abnormality: Secondary | ICD-10-CM | POA: Diagnosis not present

## 2024-05-20 DIAGNOSIS — E785 Hyperlipidemia, unspecified: Secondary | ICD-10-CM

## 2024-05-20 DIAGNOSIS — R7989 Other specified abnormal findings of blood chemistry: Secondary | ICD-10-CM | POA: Diagnosis not present

## 2024-05-20 DIAGNOSIS — I10 Essential (primary) hypertension: Secondary | ICD-10-CM | POA: Diagnosis not present

## 2024-05-21 DIAGNOSIS — Z7902 Long term (current) use of antithrombotics/antiplatelets: Secondary | ICD-10-CM | POA: Diagnosis not present

## 2024-05-21 DIAGNOSIS — Z96641 Presence of right artificial hip joint: Secondary | ICD-10-CM | POA: Diagnosis not present

## 2024-05-21 DIAGNOSIS — I252 Old myocardial infarction: Secondary | ICD-10-CM | POA: Diagnosis not present

## 2024-05-21 DIAGNOSIS — M109 Gout, unspecified: Secondary | ICD-10-CM | POA: Diagnosis not present

## 2024-05-21 DIAGNOSIS — Z471 Aftercare following joint replacement surgery: Secondary | ICD-10-CM | POA: Diagnosis not present

## 2024-05-21 DIAGNOSIS — I10 Essential (primary) hypertension: Secondary | ICD-10-CM | POA: Diagnosis not present

## 2024-05-21 DIAGNOSIS — Z7982 Long term (current) use of aspirin: Secondary | ICD-10-CM | POA: Diagnosis not present

## 2024-05-21 DIAGNOSIS — I2510 Atherosclerotic heart disease of native coronary artery without angina pectoris: Secondary | ICD-10-CM | POA: Diagnosis not present

## 2024-05-26 ENCOUNTER — Telehealth: Payer: Self-pay | Admitting: Pharmacy Technician

## 2024-05-26 ENCOUNTER — Other Ambulatory Visit (HOSPITAL_BASED_OUTPATIENT_CLINIC_OR_DEPARTMENT_OTHER): Payer: Self-pay

## 2024-05-26 ENCOUNTER — Other Ambulatory Visit (HOSPITAL_COMMUNITY): Payer: Self-pay

## 2024-05-26 ENCOUNTER — Other Ambulatory Visit: Payer: Self-pay | Admitting: *Deleted

## 2024-05-26 DIAGNOSIS — Z79899 Other long term (current) drug therapy: Secondary | ICD-10-CM

## 2024-05-26 DIAGNOSIS — E785 Hyperlipidemia, unspecified: Secondary | ICD-10-CM

## 2024-05-26 MED ORDER — ICOSAPENT ETHYL 1 G PO CAPS
2.0000 g | ORAL_CAPSULE | Freq: Two times a day (BID) | ORAL | 1 refills | Status: DC
  Filled 2024-05-26: qty 360, 90d supply, fill #0
  Filled 2024-07-27 – 2024-08-13 (×3): qty 360, 90d supply, fill #1

## 2024-05-26 NOTE — Telephone Encounter (Signed)
 Lab orders placed and released.  Left message for pt to have blood work in October as requested and call back if any questions or concerns.

## 2024-05-26 NOTE — Addendum Note (Signed)
 Addended by: THEOTIS SHARLET PARAS on: 05/26/2024 11:36 AM   Modules accepted: Orders

## 2024-05-26 NOTE — Telephone Encounter (Signed)
 Hi, he approved this in the chat but it was not sent to the pharmacy. Can someone send the prescription to the pharmacy please? Therisa is a Associate Professor at [employer] so she can't approve the fill. Thank you!

## 2024-05-26 NOTE — Telephone Encounter (Signed)
 Hi, the wife called and is asking if he needs repeat labs. He last had labs 05/2023 and his repatha  prior authorization will be expiring in jan. She is asking if his labs can be ordered for October since insurance requires labs to be done within 120 days of prior authorization

## 2024-05-26 NOTE — Telephone Encounter (Signed)
 Pt's medication was sent to pt's pharmacy as requested. Confirmation received.

## 2024-05-27 DIAGNOSIS — M109 Gout, unspecified: Secondary | ICD-10-CM | POA: Diagnosis not present

## 2024-05-27 DIAGNOSIS — Z471 Aftercare following joint replacement surgery: Secondary | ICD-10-CM | POA: Diagnosis not present

## 2024-05-27 DIAGNOSIS — Z7902 Long term (current) use of antithrombotics/antiplatelets: Secondary | ICD-10-CM | POA: Diagnosis not present

## 2024-05-27 DIAGNOSIS — I2510 Atherosclerotic heart disease of native coronary artery without angina pectoris: Secondary | ICD-10-CM | POA: Diagnosis not present

## 2024-05-27 DIAGNOSIS — I252 Old myocardial infarction: Secondary | ICD-10-CM | POA: Diagnosis not present

## 2024-05-27 DIAGNOSIS — Z96641 Presence of right artificial hip joint: Secondary | ICD-10-CM | POA: Diagnosis not present

## 2024-05-27 DIAGNOSIS — Z7982 Long term (current) use of aspirin: Secondary | ICD-10-CM | POA: Diagnosis not present

## 2024-05-27 DIAGNOSIS — I10 Essential (primary) hypertension: Secondary | ICD-10-CM | POA: Diagnosis not present

## 2024-06-02 DIAGNOSIS — I252 Old myocardial infarction: Secondary | ICD-10-CM | POA: Diagnosis not present

## 2024-06-02 DIAGNOSIS — I10 Essential (primary) hypertension: Secondary | ICD-10-CM | POA: Diagnosis not present

## 2024-06-02 DIAGNOSIS — Z471 Aftercare following joint replacement surgery: Secondary | ICD-10-CM | POA: Diagnosis not present

## 2024-06-02 DIAGNOSIS — Z96641 Presence of right artificial hip joint: Secondary | ICD-10-CM | POA: Diagnosis not present

## 2024-06-02 DIAGNOSIS — Z7982 Long term (current) use of aspirin: Secondary | ICD-10-CM | POA: Diagnosis not present

## 2024-06-02 DIAGNOSIS — Z7902 Long term (current) use of antithrombotics/antiplatelets: Secondary | ICD-10-CM | POA: Diagnosis not present

## 2024-06-02 DIAGNOSIS — I2510 Atherosclerotic heart disease of native coronary artery without angina pectoris: Secondary | ICD-10-CM | POA: Diagnosis not present

## 2024-06-02 DIAGNOSIS — M109 Gout, unspecified: Secondary | ICD-10-CM | POA: Diagnosis not present

## 2024-06-09 DIAGNOSIS — I2510 Atherosclerotic heart disease of native coronary artery without angina pectoris: Secondary | ICD-10-CM | POA: Diagnosis not present

## 2024-06-09 DIAGNOSIS — I10 Essential (primary) hypertension: Secondary | ICD-10-CM | POA: Diagnosis not present

## 2024-06-09 DIAGNOSIS — Z7902 Long term (current) use of antithrombotics/antiplatelets: Secondary | ICD-10-CM | POA: Diagnosis not present

## 2024-06-09 DIAGNOSIS — M109 Gout, unspecified: Secondary | ICD-10-CM | POA: Diagnosis not present

## 2024-06-09 DIAGNOSIS — Z96641 Presence of right artificial hip joint: Secondary | ICD-10-CM | POA: Diagnosis not present

## 2024-06-09 DIAGNOSIS — I252 Old myocardial infarction: Secondary | ICD-10-CM | POA: Diagnosis not present

## 2024-06-09 DIAGNOSIS — Z7982 Long term (current) use of aspirin: Secondary | ICD-10-CM | POA: Diagnosis not present

## 2024-06-09 DIAGNOSIS — Z471 Aftercare following joint replacement surgery: Secondary | ICD-10-CM | POA: Diagnosis not present

## 2024-06-15 ENCOUNTER — Other Ambulatory Visit (INDEPENDENT_AMBULATORY_CARE_PROVIDER_SITE_OTHER): Payer: Self-pay

## 2024-06-15 ENCOUNTER — Ambulatory Visit (INDEPENDENT_AMBULATORY_CARE_PROVIDER_SITE_OTHER): Admitting: Orthopaedic Surgery

## 2024-06-15 DIAGNOSIS — Z96641 Presence of right artificial hip joint: Secondary | ICD-10-CM

## 2024-06-15 NOTE — Progress Notes (Signed)
 The patient is 6 weeks status post a right total hip arthroplasty to treat significant right hip osteoarthritis with osteonecrosis.  He is only 58 years old.  He is ambulating with a cane today.  He reports that he is improving slightly.  He is still very slow to mobilize.  His right operative hip moves smoother and more fluidly than it did on his last visit and the left hip has no blocks to rotation.  Standing AP pelvis shows a well-seated right total hip arthroplasty.  The left hip joint space is well-maintained.  He does report some low back pain and this has been worked up in the past.  I think some of this was related to his posture and how bad his right hip was before.  He is scheduled to go back to work at 12 weeks postoperative.  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  No x-rays are needed at this next visit in 4 weeks from now.

## 2024-06-16 DIAGNOSIS — I252 Old myocardial infarction: Secondary | ICD-10-CM | POA: Diagnosis not present

## 2024-06-16 DIAGNOSIS — D649 Anemia, unspecified: Secondary | ICD-10-CM | POA: Diagnosis not present

## 2024-06-16 DIAGNOSIS — Z7902 Long term (current) use of antithrombotics/antiplatelets: Secondary | ICD-10-CM | POA: Diagnosis not present

## 2024-06-16 DIAGNOSIS — Z96641 Presence of right artificial hip joint: Secondary | ICD-10-CM | POA: Diagnosis not present

## 2024-06-16 DIAGNOSIS — Z471 Aftercare following joint replacement surgery: Secondary | ICD-10-CM | POA: Diagnosis not present

## 2024-06-16 DIAGNOSIS — Z7982 Long term (current) use of aspirin: Secondary | ICD-10-CM | POA: Diagnosis not present

## 2024-06-16 DIAGNOSIS — M109 Gout, unspecified: Secondary | ICD-10-CM | POA: Diagnosis not present

## 2024-06-16 DIAGNOSIS — I10 Essential (primary) hypertension: Secondary | ICD-10-CM | POA: Diagnosis not present

## 2024-06-16 DIAGNOSIS — I2510 Atherosclerotic heart disease of native coronary artery without angina pectoris: Secondary | ICD-10-CM | POA: Diagnosis not present

## 2024-06-17 DIAGNOSIS — D649 Anemia, unspecified: Secondary | ICD-10-CM | POA: Diagnosis not present

## 2024-06-17 DIAGNOSIS — E782 Mixed hyperlipidemia: Secondary | ICD-10-CM | POA: Diagnosis not present

## 2024-06-17 DIAGNOSIS — M1A3620 Chronic gout due to renal impairment, left knee, without tophus (tophi): Secondary | ICD-10-CM | POA: Diagnosis not present

## 2024-06-17 DIAGNOSIS — Z1212 Encounter for screening for malignant neoplasm of rectum: Secondary | ICD-10-CM | POA: Diagnosis not present

## 2024-06-23 ENCOUNTER — Other Ambulatory Visit (HOSPITAL_BASED_OUTPATIENT_CLINIC_OR_DEPARTMENT_OTHER): Payer: Self-pay

## 2024-06-23 DIAGNOSIS — R82998 Other abnormal findings in urine: Secondary | ICD-10-CM | POA: Diagnosis not present

## 2024-06-23 DIAGNOSIS — Z1339 Encounter for screening examination for other mental health and behavioral disorders: Secondary | ICD-10-CM | POA: Diagnosis not present

## 2024-06-23 DIAGNOSIS — Z0000 Encounter for general adult medical examination without abnormal findings: Secondary | ICD-10-CM | POA: Diagnosis not present

## 2024-06-23 DIAGNOSIS — I1310 Hypertensive heart and chronic kidney disease without heart failure, with stage 1 through stage 4 chronic kidney disease, or unspecified chronic kidney disease: Secondary | ICD-10-CM | POA: Diagnosis not present

## 2024-06-23 DIAGNOSIS — Z1331 Encounter for screening for depression: Secondary | ICD-10-CM | POA: Diagnosis not present

## 2024-06-23 MED ORDER — LISINOPRIL 5 MG PO TABS
5.0000 mg | ORAL_TABLET | Freq: Every day | ORAL | 3 refills | Status: AC
  Filled 2024-06-23: qty 90, 90d supply, fill #0
  Filled 2024-09-16: qty 90, 90d supply, fill #1
  Filled 2024-11-30: qty 90, 90d supply, fill #2

## 2024-07-22 ENCOUNTER — Ambulatory Visit (INDEPENDENT_AMBULATORY_CARE_PROVIDER_SITE_OTHER): Admitting: Orthopaedic Surgery

## 2024-07-22 ENCOUNTER — Encounter: Payer: Self-pay | Admitting: Orthopaedic Surgery

## 2024-07-22 DIAGNOSIS — Z96641 Presence of right artificial hip joint: Secondary | ICD-10-CM

## 2024-07-22 NOTE — Progress Notes (Signed)
 The patient is now 11 weeks status post a right total hip arthroplasty to treat significant right hip pain and arthritis.  He is doing well overall.  He feels confident about returning to his regular work duty starting next week.  He is walking without any significant limp or assistive device.  His ligaments appear equal.  He tolerates this easily putting his right hip through internal and external rotation.  At this point the next time we need to see him is not for 6 months unless there is issues.  Will have a standing AP pelvis and lateral of his right hip at that visit.  If there are issues before then he knows to let us  know.  We did give him a return to work note for next week.

## 2024-07-27 ENCOUNTER — Other Ambulatory Visit: Payer: Self-pay

## 2024-07-27 ENCOUNTER — Other Ambulatory Visit (HOSPITAL_BASED_OUTPATIENT_CLINIC_OR_DEPARTMENT_OTHER): Payer: Self-pay

## 2024-07-31 ENCOUNTER — Other Ambulatory Visit (HOSPITAL_BASED_OUTPATIENT_CLINIC_OR_DEPARTMENT_OTHER): Payer: Self-pay

## 2024-08-13 ENCOUNTER — Other Ambulatory Visit (HOSPITAL_BASED_OUTPATIENT_CLINIC_OR_DEPARTMENT_OTHER): Payer: Self-pay

## 2024-09-03 ENCOUNTER — Other Ambulatory Visit (HOSPITAL_BASED_OUTPATIENT_CLINIC_OR_DEPARTMENT_OTHER): Payer: Self-pay

## 2024-09-03 DIAGNOSIS — L300 Nummular dermatitis: Secondary | ICD-10-CM | POA: Diagnosis not present

## 2024-09-03 MED ORDER — CLOBETASOL PROPIONATE 0.05 % EX CREA
TOPICAL_CREAM | Freq: Two times a day (BID) | CUTANEOUS | 1 refills | Status: AC | PRN
  Filled 2024-09-03: qty 60, 30d supply, fill #0

## 2024-09-14 ENCOUNTER — Encounter: Payer: Self-pay | Admitting: Radiology

## 2024-09-16 ENCOUNTER — Other Ambulatory Visit: Payer: Self-pay

## 2024-09-23 ENCOUNTER — Other Ambulatory Visit (HOSPITAL_COMMUNITY): Payer: Self-pay

## 2024-09-23 ENCOUNTER — Telehealth: Payer: Self-pay | Admitting: Pharmacy Technician

## 2024-09-23 DIAGNOSIS — K439 Ventral hernia without obstruction or gangrene: Secondary | ICD-10-CM | POA: Diagnosis not present

## 2024-09-23 NOTE — Telephone Encounter (Signed)
   Pharmacy Patient Advocate Encounter   Received notification from CoverMyMeds that prior authorization for repatha  is required/requested.   Insurance verification completed.   The patient is insured through [facility].   Per test claim: PA required; PA submitted to above mentioned insurance via Latent Key/confirmation #/[id number] Status is pending

## 2024-09-24 NOTE — Telephone Encounter (Signed)
 Pharmacy Patient Advocate Encounter  Received notification from [facility] that Prior Authorization for repatha  has been APPROVED from 09/24/24 to 09/23/25   PA #/Case ID/Reference #: [id number]

## 2024-09-29 ENCOUNTER — Encounter (HOSPITAL_BASED_OUTPATIENT_CLINIC_OR_DEPARTMENT_OTHER): Payer: Self-pay

## 2024-09-29 ENCOUNTER — Other Ambulatory Visit (HOSPITAL_BASED_OUTPATIENT_CLINIC_OR_DEPARTMENT_OTHER): Payer: Self-pay

## 2024-09-30 ENCOUNTER — Other Ambulatory Visit: Payer: Self-pay

## 2024-09-30 ENCOUNTER — Other Ambulatory Visit: Payer: Self-pay | Admitting: Cardiology

## 2024-09-30 ENCOUNTER — Other Ambulatory Visit (HOSPITAL_BASED_OUTPATIENT_CLINIC_OR_DEPARTMENT_OTHER): Payer: Self-pay

## 2024-09-30 ENCOUNTER — Other Ambulatory Visit: Payer: Self-pay | Admitting: Physician Assistant

## 2024-09-30 DIAGNOSIS — Z951 Presence of aortocoronary bypass graft: Secondary | ICD-10-CM

## 2024-09-30 DIAGNOSIS — I2510 Atherosclerotic heart disease of native coronary artery without angina pectoris: Secondary | ICD-10-CM

## 2024-09-30 MED ORDER — NITROGLYCERIN 0.4 MG SL SUBL
0.4000 mg | SUBLINGUAL_TABLET | SUBLINGUAL | 1 refills | Status: AC | PRN
  Filled 2024-09-30: qty 25, 7d supply, fill #0

## 2024-10-09 ENCOUNTER — Other Ambulatory Visit (HOSPITAL_BASED_OUTPATIENT_CLINIC_OR_DEPARTMENT_OTHER): Payer: Self-pay

## 2024-11-05 ENCOUNTER — Other Ambulatory Visit: Payer: Self-pay

## 2024-11-11 ENCOUNTER — Other Ambulatory Visit (HOSPITAL_BASED_OUTPATIENT_CLINIC_OR_DEPARTMENT_OTHER): Payer: Self-pay

## 2024-11-13 ENCOUNTER — Other Ambulatory Visit: Payer: Self-pay

## 2024-11-13 ENCOUNTER — Encounter: Payer: Self-pay | Admitting: Cardiovascular Disease

## 2024-11-13 ENCOUNTER — Other Ambulatory Visit (HOSPITAL_BASED_OUTPATIENT_CLINIC_OR_DEPARTMENT_OTHER): Payer: Self-pay

## 2024-11-13 DIAGNOSIS — E782 Mixed hyperlipidemia: Secondary | ICD-10-CM

## 2024-11-13 DIAGNOSIS — I2510 Atherosclerotic heart disease of native coronary artery without angina pectoris: Secondary | ICD-10-CM

## 2024-11-13 MED ORDER — REPATHA SURECLICK 140 MG/ML ~~LOC~~ SOAJ
140.0000 mg | SUBCUTANEOUS | 0 refills | Status: AC
  Filled 2024-11-13: qty 2, 28d supply, fill #0
  Filled 2024-11-30 – 2024-12-13 (×2): qty 2, 28d supply, fill #1

## 2024-11-13 MED ORDER — ICOSAPENT ETHYL 1 G PO CAPS
2.0000 g | ORAL_CAPSULE | Freq: Two times a day (BID) | ORAL | 0 refills | Status: AC
  Filled 2024-11-13: qty 360, 90d supply, fill #0

## 2024-11-30 ENCOUNTER — Other Ambulatory Visit: Payer: Self-pay

## 2024-11-30 ENCOUNTER — Other Ambulatory Visit (HOSPITAL_BASED_OUTPATIENT_CLINIC_OR_DEPARTMENT_OTHER): Payer: Self-pay

## 2024-12-03 LAB — HEPATIC FUNCTION PANEL
ALT: 16 IU/L (ref 0–44)
AST: 21 IU/L (ref 0–40)
Albumin: 4.8 g/dL (ref 3.8–4.9)
Alkaline Phosphatase: 68 IU/L (ref 47–123)
Bilirubin Total: 0.6 mg/dL (ref 0.0–1.2)
Bilirubin, Direct: 0.22 mg/dL (ref 0.00–0.40)
Total Protein: 7.7 g/dL (ref 6.0–8.5)

## 2024-12-03 LAB — LIPID PANEL
Chol/HDL Ratio: 2.2 ratio (ref 0.0–5.0)
Cholesterol, Total: 141 mg/dL (ref 100–199)
HDL: 63 mg/dL
LDL Chol Calc (NIH): 59 mg/dL (ref 0–99)
Triglycerides: 102 mg/dL (ref 0–149)
VLDL Cholesterol Cal: 19 mg/dL (ref 5–40)

## 2024-12-04 ENCOUNTER — Ambulatory Visit: Payer: Self-pay | Admitting: Physician Assistant

## 2024-12-07 ENCOUNTER — Other Ambulatory Visit (HOSPITAL_BASED_OUTPATIENT_CLINIC_OR_DEPARTMENT_OTHER): Payer: Self-pay

## 2024-12-08 ENCOUNTER — Other Ambulatory Visit (HOSPITAL_BASED_OUTPATIENT_CLINIC_OR_DEPARTMENT_OTHER): Payer: Self-pay

## 2024-12-08 MED ORDER — PANTOPRAZOLE SODIUM 40 MG PO TBEC
40.0000 mg | DELAYED_RELEASE_TABLET | Freq: Every day | ORAL | 3 refills | Status: AC | PRN
  Filled 2024-12-08 – 2024-12-09 (×2): qty 90, 90d supply, fill #0

## 2024-12-08 NOTE — Telephone Encounter (Signed)
 LVM notifying pt that his lab results were sent via MyChart and to call our office if he has any questions.

## 2024-12-09 ENCOUNTER — Other Ambulatory Visit (HOSPITAL_BASED_OUTPATIENT_CLINIC_OR_DEPARTMENT_OTHER): Payer: Self-pay

## 2024-12-21 ENCOUNTER — Ambulatory Visit: Admitting: Cardiovascular Disease

## 2025-01-20 ENCOUNTER — Ambulatory Visit: Admitting: Orthopaedic Surgery

## 2025-02-25 ENCOUNTER — Ambulatory Visit: Admitting: Physician Assistant
